# Patient Record
Sex: Male | Born: 1965
Health system: Southern US, Community
[De-identification: ages and names within clinical notes are randomized; demographics above are authoritative.]

## PROBLEM LIST (undated history)

## (undated) DIAGNOSIS — Z8489 Family history of other specified conditions: Secondary | ICD-10-CM

## (undated) DIAGNOSIS — I2721 Secondary pulmonary arterial hypertension: Secondary | ICD-10-CM

## (undated) DIAGNOSIS — Z87442 Personal history of urinary calculi: Secondary | ICD-10-CM

## (undated) DIAGNOSIS — I10 Essential (primary) hypertension: Secondary | ICD-10-CM

## (undated) DIAGNOSIS — D171 Benign lipomatous neoplasm of skin and subcutaneous tissue of trunk: Secondary | ICD-10-CM

## (undated) DIAGNOSIS — G4730 Sleep apnea, unspecified: Secondary | ICD-10-CM

## (undated) HISTORY — DX: Sleep apnea, unspecified: G47.30

## (undated) HISTORY — DX: Essential (primary) hypertension: I10

## (undated) HISTORY — DX: Morbid (severe) obesity due to excess calories: E66.01

## (undated) HISTORY — DX: Secondary pulmonary arterial hypertension: I27.21

---

## 2000-11-13 ENCOUNTER — Ambulatory Visit (HOSPITAL_COMMUNITY): Admission: RE | Admit: 2000-11-13 | Discharge: 2000-11-13 | Payer: Self-pay | Admitting: Specialist

## 2002-08-19 ENCOUNTER — Emergency Department (HOSPITAL_COMMUNITY): Admission: EM | Admit: 2002-08-19 | Discharge: 2002-08-19 | Payer: Self-pay | Admitting: Emergency Medicine

## 2002-08-19 ENCOUNTER — Encounter: Payer: Self-pay | Admitting: Emergency Medicine

## 2002-12-04 ENCOUNTER — Emergency Department (HOSPITAL_COMMUNITY): Admission: EM | Admit: 2002-12-04 | Discharge: 2002-12-04 | Payer: Self-pay | Admitting: Podiatry

## 2005-12-24 ENCOUNTER — Emergency Department (HOSPITAL_COMMUNITY): Admission: EM | Admit: 2005-12-24 | Discharge: 2005-12-24 | Payer: Self-pay | Admitting: Family Medicine

## 2005-12-26 ENCOUNTER — Emergency Department (HOSPITAL_COMMUNITY): Admission: EM | Admit: 2005-12-26 | Discharge: 2005-12-26 | Payer: Self-pay | Admitting: Family Medicine

## 2009-01-09 ENCOUNTER — Emergency Department (HOSPITAL_COMMUNITY): Admission: EM | Admit: 2009-01-09 | Discharge: 2009-01-09 | Payer: Self-pay | Admitting: Family Medicine

## 2009-06-10 ENCOUNTER — Emergency Department (HOSPITAL_COMMUNITY): Admission: EM | Admit: 2009-06-10 | Discharge: 2009-06-10 | Payer: Self-pay | Admitting: Emergency Medicine

## 2009-06-14 ENCOUNTER — Inpatient Hospital Stay (HOSPITAL_COMMUNITY): Admission: EM | Admit: 2009-06-14 | Discharge: 2009-06-18 | Payer: Self-pay | Admitting: Emergency Medicine

## 2009-06-14 ENCOUNTER — Ambulatory Visit: Payer: Self-pay | Admitting: Infectious Disease

## 2009-06-17 ENCOUNTER — Encounter (INDEPENDENT_AMBULATORY_CARE_PROVIDER_SITE_OTHER): Payer: Self-pay | Admitting: Surgery

## 2009-06-25 ENCOUNTER — Encounter (INDEPENDENT_AMBULATORY_CARE_PROVIDER_SITE_OTHER): Payer: Self-pay | Admitting: Internal Medicine

## 2009-06-25 ENCOUNTER — Ambulatory Visit: Payer: Self-pay | Admitting: Internal Medicine

## 2009-06-25 DIAGNOSIS — L03319 Cellulitis of trunk, unspecified: Secondary | ICD-10-CM

## 2009-06-25 DIAGNOSIS — L02219 Cutaneous abscess of trunk, unspecified: Secondary | ICD-10-CM

## 2009-11-15 ENCOUNTER — Observation Stay (HOSPITAL_COMMUNITY): Admission: EM | Admit: 2009-11-15 | Discharge: 2009-11-15 | Payer: Self-pay | Admitting: Emergency Medicine

## 2009-11-15 ENCOUNTER — Ambulatory Visit: Payer: Self-pay | Admitting: Vascular Surgery

## 2009-11-15 ENCOUNTER — Encounter (INDEPENDENT_AMBULATORY_CARE_PROVIDER_SITE_OTHER): Payer: Self-pay | Admitting: Emergency Medicine

## 2011-01-23 LAB — POCT I-STAT, CHEM 8
BUN: 11 mg/dL (ref 6–23)
Calcium, Ion: 1.14 mmol/L (ref 1.12–1.32)
Chloride: 103 mEq/L (ref 96–112)
Creatinine, Ser: 1 mg/dL (ref 0.4–1.5)
Glucose, Bld: 103 mg/dL — ABNORMAL HIGH (ref 70–99)
HCT: 47 % (ref 39.0–52.0)
Hemoglobin: 16 g/dL (ref 13.0–17.0)
Potassium: 3.6 mEq/L (ref 3.5–5.1)
Sodium: 140 mEq/L (ref 135–145)
TCO2: 31 mmol/L (ref 0–100)

## 2011-01-23 LAB — CBC
HCT: 43.5 % (ref 39.0–52.0)
Hemoglobin: 14.4 g/dL (ref 13.0–17.0)
MCHC: 33.1 g/dL (ref 30.0–36.0)
MCV: 80.6 fL (ref 78.0–100.0)
Platelets: 334 10*3/uL (ref 150–400)
RBC: 5.4 MIL/uL (ref 4.22–5.81)
RDW: 14.8 % (ref 11.5–15.5)
WBC: 9.2 10*3/uL (ref 4.0–10.5)

## 2011-01-23 LAB — DIFFERENTIAL
Basophils Absolute: 0.1 10*3/uL (ref 0.0–0.1)
Basophils Relative: 1 % (ref 0–1)
Eosinophils Absolute: 0 10*3/uL (ref 0.0–0.7)
Eosinophils Relative: 1 % (ref 0–5)
Lymphocytes Relative: 25 % (ref 12–46)
Lymphs Abs: 2.3 10*3/uL (ref 0.7–4.0)
Monocytes Absolute: 1 10*3/uL (ref 0.1–1.0)
Monocytes Relative: 11 % (ref 3–12)
Neutro Abs: 5.7 10*3/uL (ref 1.7–7.7)
Neutrophils Relative %: 62 % (ref 43–77)

## 2011-02-12 LAB — BASIC METABOLIC PANEL
BUN: 7 mg/dL (ref 6–23)
BUN: 8 mg/dL (ref 6–23)
CO2: 28 mEq/L (ref 19–32)
Calcium: 8.5 mg/dL (ref 8.4–10.5)
Calcium: 8.5 mg/dL (ref 8.4–10.5)
Chloride: 99 mEq/L (ref 96–112)
Creatinine, Ser: 1.03 mg/dL (ref 0.4–1.5)
Creatinine, Ser: 1.14 mg/dL (ref 0.4–1.5)
Creatinine, Ser: 1.16 mg/dL (ref 0.4–1.5)
GFR calc Af Amer: >60 mL/min (ref >60)
GFR calc non Af Amer: >60 mL/min (ref >60)
GFR calc non Af Amer: >60 mL/min (ref >60)
Glucose, Bld: 91 mg/dL (ref 70–99)
Potassium: 4 mEq/L (ref 3.5–5.1)
Potassium: 4 mEq/L (ref 3.5–5.1)
Sodium: 135 mEq/L (ref 135–145)

## 2011-02-12 LAB — ANAEROBIC CULTURE

## 2011-02-12 LAB — POCT I-STAT, CHEM 8
Glucose, Bld: 105 mg/dL — ABNORMAL HIGH (ref 70–99)
HCT: 49 % (ref 39.0–52.0)
Hemoglobin: 16.7 g/dL (ref 13.0–17.0)
Potassium: 4.1 mEq/L (ref 3.5–5.1)
Sodium: 138 mEq/L (ref 135–145)

## 2011-02-12 LAB — CULTURE, ROUTINE-ABSCESS

## 2011-02-12 LAB — CBC
HCT: 40.1 % (ref 39.0–52.0)
HCT: 43.9 % (ref 39.0–52.0)
MCHC: 33 g/dL (ref 30.0–36.0)
MCV: 80.9 fL (ref 78.0–100.0)
MCV: 81 fL (ref 78.0–100.0)
Platelets: 354 10*3/uL (ref 150–400)
Platelets: 375 10*3/uL (ref 150–400)
Platelets: 377 10*3/uL (ref 150–400)
RBC: 4.98 MIL/uL (ref 4.22–5.81)
RDW: 14.8 % (ref 11.5–15.5)
WBC: 11.3 10*3/uL — ABNORMAL HIGH (ref 4.0–10.5)
WBC: 12.4 10*3/uL — ABNORMAL HIGH (ref 4.0–10.5)
WBC: 13.4 10*3/uL — ABNORMAL HIGH (ref 4.0–10.5)

## 2011-02-12 LAB — CULTURE, BLOOD (ROUTINE X 2): Culture: NO GROWTH

## 2011-02-12 LAB — URINALYSIS, ROUTINE W REFLEX MICROSCOPIC
Bilirubin Urine: NEGATIVE
Glucose, UA: NEGATIVE mg/dL
Ketones, ur: NEGATIVE mg/dL
Protein, ur: NEGATIVE mg/dL

## 2011-02-12 LAB — DIFFERENTIAL
Basophils Absolute: 0 10*3/uL (ref 0.0–0.1)
Basophils Relative: 0 % (ref 0–1)
Basophils Relative: 0 % (ref 0–1)
Eosinophils Absolute: 0.1 10*3/uL (ref 0.0–0.7)
Eosinophils Absolute: 0.1 10*3/uL (ref 0.0–0.7)
Eosinophils Relative: 1 % (ref 0–5)
Lymphocytes Relative: 14 % (ref 12–46)
Lymphs Abs: 1.5 10*3/uL (ref 0.7–4.0)
Lymphs Abs: 1.6 10*3/uL (ref 0.7–4.0)
Monocytes Relative: 12 % (ref 3–12)
Neutro Abs: 10 10*3/uL — ABNORMAL HIGH (ref 1.7–7.7)
Neutro Abs: 8.7 10*3/uL — ABNORMAL HIGH (ref 1.7–7.7)
Neutrophils Relative %: 72 % (ref 43–77)
Neutrophils Relative %: 75 % (ref 43–77)
Neutrophils Relative %: 77 % (ref 43–77)

## 2011-02-12 LAB — FUNGUS CULTURE W SMEAR: Fungal Smear: NONE SEEN

## 2011-02-12 LAB — GLUCOSE, CAPILLARY: Glucose-Capillary: 118 mg/dL — ABNORMAL HIGH (ref 70–99)

## 2011-02-12 LAB — VANCOMYCIN, TROUGH: Vancomycin Tr: 16.1 ug/mL (ref 10.0–20.0)

## 2011-02-12 LAB — COMPREHENSIVE METABOLIC PANEL
ALT: 14 U/L (ref 0–53)
AST: 15 U/L (ref 0–37)
Calcium: 8.7 mg/dL (ref 8.4–10.5)
Creatinine, Ser: 1.05 mg/dL (ref 0.4–1.5)
GFR calc Af Amer: >60 mL/min (ref >60)
Sodium: 135 mEq/L (ref 135–145)
Total Protein: 7.7 g/dL (ref 6.0–8.3)

## 2011-02-12 LAB — AFB CULTURE WITH SMEAR (NOT AT ARMC)

## 2011-02-12 LAB — C-REACTIVE PROTEIN: CRP: 11 mg/dL — ABNORMAL HIGH (ref <0.6)

## 2011-02-12 LAB — LIPID PANEL
HDL: 33 mg/dL — ABNORMAL LOW (ref >39)
Total CHOL/HDL Ratio: 4.5 RATIO

## 2011-02-12 LAB — HEMOGLOBIN A1C
Hgb A1c MFr Bld: 5.9 % (ref 4.6–6.1)
Mean Plasma Glucose: 123 mg/dL

## 2011-02-12 LAB — PROTIME-INR: Prothrombin Time: 14.2 seconds (ref 11.6–15.2)

## 2011-02-12 LAB — TSH: TSH: 0.864 u[IU]/mL (ref 0.350–4.500)

## 2011-03-22 NOTE — Discharge Summary (Signed)
Tyler Walters, Tyler Walters NO.:  [id number]   MEDICAL RECORD NO.:  [id number]          PATIENT TYPE:  INP   LOCATION:  5158                         FACILITY:  [facility]   PHYSICIAN:  Acey Lav, MD  DATE OF BIRTH:  Oct 01, 1966   DATE OF ADMISSION:  06/14/2009  DATE OF DISCHARGE:  06/18/2009                               DISCHARGE SUMMARY   DISCHARGE DIAGNOSES:  1. Abdominal wall cellulitis with abscess.  2. Obesity.   DISCHARGE MEDICATIONS:  1. Percocet 5/325 take 1-2 tablets every 4-6 hours as needed for pain.  2. Keflex 500 mg 2 tablets every 6 hours (4 times a day) for 2 weeks.   DISPOSITION AND FOLLOWUP:  Tyler Walters is discharged in stable  condition.  He will follow with Dr. Theotis Barrio at [facility] on June 25, 2009, at 8:30 a.m.  At that time, it should be  ensured that he is taking his antibiotics that his pain is controlled  and that his wound is healing well.  He will also follow up with Postop  Wound Clinic at [facility] on June 30, 2009, at 3  o'clock p.m.   PROCEDURES PERFORMED:  1. Incision and drainage of abdominal wall abscess on June 17, 2009,      performed by Dr. Manus Rudd.  2. CT scan of the abdomen with contrast on June 14, 2009, shows      marked subcutaneous edema and overlying skin thickening in the left      anterolateral body wall without discrete abscess.  No intra-      abdominal process.   CONSULTATIONS:  [facility], Wilmon Arms. Tsuei, M.D.   ADMITTING HISTORY AND PHYSICAL:  Tyler Walters is a 45 year old morbidly  obese African American male with no past medical history who presents  complaining of redness on his left upper abdomen for 9 days prior to  admission.  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  On the same day, he was also scraped by a nail in the same area also  while performing  construction work.  Since that time, the area has  become progressively more painful and red and for the 5 days prior to  admission, he has had fevers sometimes as high as 104 degrees.  Also  sweating, chills, and nausea.  He presented to the emergency room 4 days  prior to admission and found to have a fever of 101 with diagnosis of  cellulitis and treated with Bactrim, which he has taken without relief.   PHYSICAL EXAMINATION:  VITAL SIGNS:  Temperature 98.7, blood pressure  123/76, respirations 22, oxygen saturation 98% on room air.  ABDOMEN:  Significant for redness and warmth over the left upper abdomen  in a 7 x 7 cm distribution, which is extremely tender to palpation.  No  blood.  No fluctuance or mass.  There is a linear excoriation in the  center of this area; otherwise, exam is unremarkable.  LABORATORY DATA:  Sodium 134, potassium 4.0, chloride 98, bicarb 28, BUN  8, creatinine 1.14, glucose 123.  White blood cells 11.3, hemoglobin  14.4, hematocrit 43.9, platelets 375.   HOSPITAL COURSE:  Abdominal wall cellulitis with abscess.  Tyler Walters  was initially treated with vancomycin and Zosyn on admission and on the  morning of day #2 of admission, the area of erythema had hardened and a  mass had become palpable underneath, which then drained frankly purulent  material.  At that time, Surgery was consulted for incision and drainage  of abscess, which was performed on June 17, 2009, without  complication.  Cultures of the abscess show MSSA, and Tyler Walters will  continue on Keflex with followup in [facility] and ID after discharge.  He will  also have home health assistance with dressing changes provided through  [facility].   DISCHARGE LABORATORY DATA AND VITAL SIGNS:  On day of discharge,  temperature 98.4, pulse 90, blood pressure 126/80, oxygen saturation 93%  on room air.    DICTATION ENDED AT THIS POINT      Elby Showers, MD  Electronically  Signed      Acey Lav, MD  Electronically Signed    CW/MEDQ  D:  06/20/2009  T:  06/20/2009  Job:  [id number]

## 2011-03-22 NOTE — Op Note (Signed)
NAMENASIIR, MONTS NO.:  [id number]   MEDICAL RECORD NO.:  [id number]           PATIENT TYPE:   LOCATION:                                 FACILITY:   PHYSICIAN:  Wilmon Arms. Corliss Skains, M.D. DATE OF BIRTH:  11/01/1966   DATE OF PROCEDURE:  06/17/2009  DATE OF DISCHARGE:                               OPERATIVE REPORT   PREOPERATIVE DIAGNOSIS:  Abdominal wall abscess.   POSTOPERATIVE DIAGNOSIS:  Abdominal wall abscess.   PROCEDURE PERFORMED:  Incision and drainage of abdominal wall abscess.   SURGEON:  Wilmon Arms. Tsuei, MD   ANESTHESIA:  General.   INDICATIONS:  This is a 45 year old male with a past medical history  significant only for super-morbid obesity who apparently was bitten by a  spider about 12 days ago on his abdominal wall.  Several days ago, it  began becoming more inflamed and more painful.  He was admitted to the  hospital on June 14, 2009, and started on IV antibiotics.  A CT scan  showed no obvious abscess cavity, but he has been draining purulent  fluid from a small pinhole on his left lateral abdominal wall.  We were  asked to see the patient yesterday, and we made decision to proceed with  incision and drainage.   DESCRIPTION OF PROCEDURE:  The patient was brought to the operating  room, placed in the supine position on the operating room table.  After  an adequate level of general anesthesia was obtained, the patient's  abdomen was prepped with Betadine, draped in sterile fashion.  A time-  out was taken to assure the proper patient and proper procedure.  I  inserted a hemostat into the opening and entered the large abscess  cavity.  We made a round the incision around this puncture site.  The  purulent fluid was cultured and sent for microbiology.  We enlarged the  hole slightly and I was able to insert a finger.  There was a large  loculated abscess which seemed to extend down into the subcutaneous fat  but did not track down to the  fascia.  We broke up the loculations with  finger dissection.  I made an elliptical incision measuring about 6 x 3  cm.  This exposed the entire abscess cavity.  We used pulse lavage to  thoroughly irrigate the abscess cavity with sterile saline.  Hemostasis  was obtained with cautery.  Kerlix gauze was then used to pack the  wound.  An ABD dressing was applied.  The patient was extubated and  brought to recovery in stable condition.  All sponge, instrument, needle  counts were correct.      Wilmon Arms. Tsuei, M.D.  Electronically Signed     MKT/MEDQ  D:  06/17/2009  T:  06/17/2009  Job:  [id number]

## 2011-03-22 NOTE — Consult Note (Signed)
NAMEHERCHEL, Tyler Walters NO.:  [id number]   MEDICAL RECORD NO.:  [id number]          PATIENT TYPE:  INP   LOCATION:  5158                         FACILITY:  [facility]   PHYSICIAN:  Wilmon Arms. Corliss Skains, M.D. DATE OF BIRTH:  10-Aug-1966   DATE OF CONSULTATION:  06/16/2009  DATE OF DISCHARGE:                                 CONSULTATION   REQUESTING PHYSICIAN:  Acey Lav, MD   CONSULTING SURGEON:  Wilmon Arms. Tsuei, MD   PRIMARY CARE PHYSICIAN:  None.   REASON FOR CONSULTATION:  Abdominal wall abscess.   HISTORY OF PRESENT ILLNESS:  Tyler Walters is a 45 year old black male who  has no significant past medical history except for severe morbid  obesity.  He states that approximately 11 days ago, he got bit by a  black widow spider while he was under his house.  He then proceeded to  get stuck by nail as well as he says.  He says he never had any pain in  this area until approximately 5-7 days ago and then began to notice some  swelling and redness.  We would like an Urgent Care Clinic where he  received Bactrim with no help.  Because this was continuing to worsen,  he came to the emergency department where he was admitted on August 8  and placed on vancomycin and [place].  Subsequently, he underwent a CT  scan, which showed no apparent abscess cavity; however, the patient  continued to drain large amounts of purulent material and therefore we  were asked to see the patient for possible surgical intervention.   REVIEW OF SYSTEMS:  Please see HPI.  Otherwise the patient denies any  chills, but he does admit to fevers and he does admit to pain in his  abdomen.  Otherwise all other systems are negative.   PAST SURGICAL HISTORY:  Several orthopedic surgeries for broken arm and  ankle.   SOCIAL HISTORY:  The patient is married.  He has two sons.  He does not  smoke and does not drink alcohol or use illicit drugs.  He is a  Corporate investment banker.   ALLERGIES:  NKDA.   MEDICATIONS:  Before he came in, he was on Bactrim DS q.12 h, otherwise  no medications.   PHYSICAL EXAMINATION:  GENERAL:  [name] is a 45 year old black male  who is morbidly obese, but otherwise in no acute distress.  VITAL SIGNS:  Temperature 99.2, blood pressure 128/91, pulse 94,  respirations 18.  HEENT:  Head is normocephalic, atraumatic.  Sclerae noninjected.  Pupils  are equal, round, and reactive to light.  Ears and nose, probably no  obvious masses or lesions.  No rhinorrhea.  Mouth is pink and moist, but  it shows no exudate.  HEART:  Regular rate and rhythm.  Normal S1 and S2.  No murmurs, rubs,  or gallops are noted.  LUNGS:  Clear to auscultation bilaterally with no wheezes, rhonchi, or  rales noted.  Respiratory effort is unlabored.  ABDOMEN:  Morbidly obese with active bowel sounds and otherwise  nondistended.  He  is tender around this 13 x 12 cm erythematous,  indurated area.  At the center of this, there is an opening with  spontaneous and copious amounts of purulent drainage.  Otherwise no  masses, hernias, or scars are noted.  PSYCH:  The patient is alert and oriented x2 with an appropriate affect.   LABORATORY DATA AND DIAGNOSTICS:  Sodium 135, potassium 4.1, glucose  105, BUN 6, creatinine 1.03.  White blood cell count 13,400; hemoglobin  13.3; hematocrit 40.4; platelet count of 377,000 with neutrophil count  of 75%.  Blood cultures showed no growth to date.  Diagnostic CT of the  abdomen shows marked subcutaneous edema and overlying skin thickening in  the left anterolateral body wall without discrete abscess.  No other  intra-abdominal processes are noted.   IMPRESSION:  1. Abdominal wall cellulitis and abscess status post spider bite.  2. Morbidly obese.   PLAN:  At this time it appears that the patient will need surgical  intervention for an incision and drainage of this abscess.  Therefore,  we will plan on taking him to the OR tomorrow.  Otherwise  he will be  made n.p.o. after midnight in preparation for surgical intervention.  At  this time we agree with the continuation of antibiotics including  vancomycin and Fortaz.      Letha Cape, PA      Wilmon Arms. Tsuei, M.D.  Electronically Signed    KEO/MEDQ  D:  06/16/2009  T:  06/17/2009  Job:  [id number]

## 2011-03-25 NOTE — Op Note (Signed)
[facility]  Patient:    Tyler Walters, Tyler Walters                        MRN: [id number] Proc. Date: 11/13/00 Adm. Date:  [id number] Attending:  Rocky Crafts                           Operative Report  SURGEON:  Philips J. Montez Morita, M.D.  ASSISTANT:  Dorie Rank, P.A.-C.  PREOPERATIVE DIAGNOSES:  Ruptured biceps tendon from its distal attachment.  POSTOPERATIVE DIAGNOSES:  Ruptured biceps tendon from its distal attachment.  OPERATION PERFORMED:  Exploration of the biceps.  HISTORY OF PRESENT ILLNESS:  This gentleman was injured about three weeks ago at work and followed by his medical doctor who obtained a MRI which showed rupture of the distal tendon from the biceps. When seen in the office last week, he had a big arm, a 300 pound man. We could not definitely feel the tendon, I could not feel it rolled up but I went by the MRI report and felt it best to go ahead and try to repair it. However, it was found to be intact today.  DESCRIPTION OF PROCEDURE:   After suitable general anesthesia, the left arm was prepped and draped up to the axilla and a sterile tourniquet applied and then exsanguinated and an upper arm tourniquet inflated to 250 mmHg. An incision was made on the lateral border of the distal upper arm crossing in an S shaped manner over the antecubital fossa and then medially. I took this incision up quite proximal in anticipation of retraction; however, the biceps tendon was found to be basically intact ______ fibrosis was released. The tendon was followed down finger wise to its attachment onto the greater tuberosity. The tendon was tugged on, pulled heavily and would not give. It was felt that perhaps there had been some partial tearing at the biceps tuberosity which was what was seen on the MRI but basically was intact. The tourniquet was let down, hemostasis was good. The ______ fibrosis was closed with 2-0 coated Vicryl, some 2-0 in the  subcu and a running 4-0 monocryl and the skin with Steri-Strips. He was placed into a sling and allowed to go home. He is to be seen in the office in five days. DD:  11/13/00 TD:  11/13/00 Job: [id number]

## 2012-03-17 ENCOUNTER — Emergency Department (HOSPITAL_COMMUNITY)
Admission: EM | Admit: 2012-03-17 | Discharge: 2012-03-18 | Disposition: A | Discharge status: Home / Self Care | Payer: Self-pay | Attending: Emergency Medicine | Admitting: Emergency Medicine

## 2012-03-17 ENCOUNTER — Emergency Department (HOSPITAL_COMMUNITY): Payer: Self-pay

## 2012-03-17 ENCOUNTER — Encounter (HOSPITAL_COMMUNITY): Payer: Self-pay | Admitting: *Deleted

## 2012-03-17 DIAGNOSIS — Y9289 Other specified places as the place of occurrence of the external cause: Secondary | ICD-10-CM | POA: Insufficient documentation

## 2012-03-17 DIAGNOSIS — M79609 Pain in unspecified limb: Secondary | ICD-10-CM | POA: Insufficient documentation

## 2012-03-17 DIAGNOSIS — Y990 Civilian activity done for income or pay: Secondary | ICD-10-CM | POA: Insufficient documentation

## 2012-03-17 DIAGNOSIS — S61219A Laceration without foreign body of unspecified finger without damage to nail, initial encounter: Secondary | ICD-10-CM

## 2012-03-17 DIAGNOSIS — W268XXA Contact with other sharp object(s), not elsewhere classified, initial encounter: Secondary | ICD-10-CM | POA: Insufficient documentation

## 2012-03-17 DIAGNOSIS — S61209A Unspecified open wound of unspecified finger without damage to nail, initial encounter: Secondary | ICD-10-CM | POA: Insufficient documentation

## 2012-03-17 NOTE — ED Notes (Signed)
Patient with laceration to right and left middle fingers.  Patient states he was working on a garage door, spring broke and lacerated each finger on lateral side of each.  Laceration on right is open, deep and muscle seen, bleeding controlled.  Laceration on left is open, no bleeding at this time.

## 2012-03-17 NOTE — ED Notes (Signed)
Patient with finger injury today from working on a garage door.  Patient has laceration to his bilateral middle fingers and right index finger has open wounds.

## 2012-03-18 MED ORDER — CEPHALEXIN 500 MG PO CAPS: 500.0000 mg | ORAL_CAPSULE | Freq: Four times a day (QID) | ORAL | Status: AC

## 2012-03-18 NOTE — ED Provider Notes (Signed)
Medical screening examination/treatment/procedure(s) were performed by non-physician practitioner and as supervising physician I was immediately available for consultation/collaboration.   Carleene Cooper III, MD 03/18/12 1332

## 2012-03-18 NOTE — ED Notes (Signed)
EDP at bedside suturing lacerations.  

## 2012-03-18 NOTE — ED Provider Notes (Signed)
History     CSN: [id number]  Arrival date & time 03/17/12  2135   First MD Initiated Contact with Patient 03/17/12 2219      Chief Complaint  Patient presents with  . Finger Injury    (Consider location/radiation/quality/duration/timing/severity/associated sxs/prior treatment) HPI Comments: Patient here after working on replacing a garage door where the spring then broke and parts flew out lacerating his right middle finger and his left middle finger - left finger with 2cm laceration - hemostatic - right with 3cm laceration - hemostatic - tetanus 2 years ago - no fever, chills, reports good sensation distal to the injuries and good capillary refill - full flexion and extension intact.  Patient is a 46 y.o. male presenting with hand injury. The history is provided by the patient. No language interpreter was used.  Hand Injury  The incident occurred 3 to 5 hours ago. The incident occurred at work. The injury mechanism was a direct blow. Pain location: bilateral third fingers. The quality of the pain is described as aching. The pain is at a severity of 2/10. The pain is mild. The pain has been constant since the incident. It is unknown if a foreign body is present. The symptoms are aggravated by movement. He has tried nothing for the symptoms.    History reviewed. No pertinent past medical history.  History reviewed. No pertinent past surgical history.  History reviewed. No pertinent family history.  History  Substance Use Topics  . Smoking status: Never Smoker   . Smokeless tobacco: Not on file  . Alcohol Use: No      Review of Systems  Musculoskeletal: Positive for myalgias.  Skin: Positive for wound.  All other systems reviewed and are negative.    Allergies  Review of patient's allergies indicates no known allergies.  Home Medications  No current outpatient prescriptions on file.  BP 158/87  Temp(Src) 98.4 F (36.9 C) (Oral)  Resp 20  SpO2 98%  Physical Exam    Nursing note and vitals reviewed. Constitutional: He is oriented to person, place, and time. He appears well-developed and well-nourished. No distress.  HENT:  Head: Normocephalic and atraumatic.  Right Ear: External ear normal.  Left Ear: External ear normal.  Nose: Nose normal.  Mouth/Throat: Oropharynx is clear and moist. No oropharyngeal exudate.  Eyes: Conjunctivae are normal. Pupils are equal, round, and reactive to light. No scleral icterus.  Neck: Normal range of motion. Neck supple.  Cardiovascular: Normal rate, regular rhythm and normal heart sounds.  Exam reveals no gallop and no friction rub.   No murmur heard. Pulmonary/Chest: Effort normal and breath sounds normal. No respiratory distress. He has no wheezes.  Abdominal: Soft. Bowel sounds are normal. He exhibits no distension. There is no tenderness.  Musculoskeletal: Normal range of motion. He exhibits no edema.       Left 3rd finger with 2cm laceration to radial side of dorsum of finger, flexion extension and abduction and adduction intact, cap refill normal and sensation intact distal to the injury - right 3rd finger with 3cm hemostatic laceration to radial aspect of the dorsum of the finger, flexion, extension, adduction and abduction intact, cap refill and sensation normal.  Lymphadenopathy:    He has no cervical adenopathy.  Neurological: He is alert and oriented to person, place, and time. No cranial nerve deficit. He exhibits normal muscle tone. Coordination normal.  Skin: Skin is warm and dry. No rash noted. No erythema. No pallor.  Psychiatric: He has a normal  mood and affect. His behavior is normal. Judgment and thought content normal.    ED Course  Procedures (including critical care time)  Labs Reviewed - No data to display Dg Hand Complete Left  03/17/2012  *RADIOLOGY REPORT*  Clinical Data: Laceration.  Pain.  LEFT HAND - COMPLETE 3+ VIEW  Comparison: None.  Findings: Laceration is seen along the radial  aspect of the long finger.  No radiopaque foreign body, fracture or dislocation is identified.  Scattered degenerative change is most notable at the DIP joints of the ring and little fingers.  IMPRESSION: Laceration of the long finger.  No foreign body or acute bony or joint abnormality.  Original Report Authenticated By: Bernadene Bell. D'ALESSIO, M.D.   Dg Hand Complete Right  03/17/2012  *RADIOLOGY REPORT*  Clinical Data: Laceration long finger.  RIGHT HAND - COMPLETE 3+ VIEW  Comparison: None.  Findings: No acute bony or joint abnormality is identified.  No radiopaque foreign body is seen.  Laceration is not clearly visualized.  Scattered degenerative change is most notable at the DIP joints of the index, ring and little fingers.  IMPRESSION: Negative for fracture foreign body.  Original Report Authenticated By: Bernadene Bell. D'ALESSIO, M.D.   LACERATION REPAIR Performed by: Patrecia Pour. Authorized by: Patrecia Pour Consent: Verbal consent obtained. Risks and benefits: risks, benefits and alternatives were discussed Consent given by: patient Patient identity confirmed: provided demographic data Prepped and Draped in normal sterile fashion Wound explored  Laceration Location: left 3rd finger  Laceration Length: 2cm  No Foreign Bodies seen or palpated  Anesthesia: local infiltration  Local anesthetic: lidocaine 2% without epinephrine  Anesthetic total: 3 ml  Irrigation method: syringe Amount of cleaning: extensive  Skin closure: nylon 4.0  Number of sutures: 3  Technique: simple interrupted  Patient tolerance: Patient tolerated the procedure well with no immediate complications.  LACERATION REPAIR Performed by: Patrecia Pour. Authorized by: Patrecia Pour Consent: Verbal consent obtained. Risks and benefits: risks, benefits and alternatives were discussed Consent given by: patient Patient identity confirmed: provided demographic data Prepped and Draped in  normal sterile fashion Wound explored  Laceration Location: right 3rd finger  Laceration Length: 3cm  No Foreign Bodies seen or palpated  Anesthesia: local infiltration  Local anesthetic: lidocaine 2% without epinephrine  Anesthetic total: 4 ml  Irrigation method: syringe Amount of cleaning:extensive  Skin closure: nylon 4.0  Number of sutures: 6  Technique: simple interrupted  Patient tolerance: Patient tolerated the procedure well with no immediate complications.  Bilateral finger lacerations  MDM  Patient with bilateral finger lacerations - no evidence of fracture, tendon or nerve damage - sutured and patient tolerated well, no evidence of foreign body.        Izola Price [place], [place] 03/18/12 0031

## 2012-03-18 NOTE — Discharge Instructions (Signed)
Laceration Care, Adult A laceration is a cut that goes through all layers of the skin. The cut goes into the tissue beneath the skin. HOME CARE For stitches (sutures) or staples:  Keep the cut clean and dry.   If you have a bandage (dressing), change it at least once a day. Change the bandage if it gets wet or dirty, or as told by your doctor.   Wash the cut with soap and water 2 times a day. Rinse the cut with water. Pat it dry with a clean towel.   Put a thin layer of medicated cream on the cut as told by your doctor.   You may shower after the first 24 hours. Do not soak the cut in water until the stitches are removed.   Only take medicines as told by your doctor.   Have your stitches or staples removed as told by your doctor.  For skin adhesive strips:  Keep the cut clean and dry.   Do not get the strips wet. You may take a bath, but be careful to keep the cut dry.   If the cut gets wet, pat it dry with a clean towel.   The strips will fall off on their own. Do not remove the strips that are still stuck to the cut.  For wound glue:  You may shower or take baths. Do not soak or scrub the cut. Do not swim. Avoid heavy sweating until the glue falls off on its own. After a shower or bath, pat the cut dry with a clean towel.   Do not put medicine on your cut until the glue falls off.   If you have a bandage, do not put tape over the glue.   Avoid lots of sunlight or tanning lamps until the glue falls off. Put sunscreen on the cut for the first year to reduce your scar.   The glue will fall off on its own. Do not pick at the glue.  You may need a tetanus shot if:  You cannot remember when you had your last tetanus shot.   You have never had a tetanus shot.  If you need a tetanus shot and you choose not to have one, you may get tetanus. Sickness from tetanus can be serious. GET HELP RIGHT AWAY IF:   Your pain does not get better with medicine.   Your arm, hand, leg, or  foot loses feeling (numbness) or changes color.   Your cut is bleeding.   Your joint feels weak, or you cannot use your joint.   You have painful lumps on your body.   Your cut is red, puffy (swollen), or painful.   You have a red line on the skin near the cut.   You have yellowish-white fluid (pus) coming from the cut.   You have a fever.   You have a bad smell coming from the cut or bandage.   Your cut breaks open before or after stitches are removed.   You notice something coming out of the cut, such as wood or glass.   You cannot move a finger or toe.  MAKE SURE YOU:   Understand these instructions.   Will watch your condition.   Will get help right away if you are not doing well or get worse.  Document Released: 04/11/2008 Document Revised: 10/13/2011 Document Reviewed: 04/19/2011 [employer] Patient Information 2012 [employer].Laceration Care, Adult A laceration is a cut or lesion that goes through all layers   of the skin and into the tissue just beneath the skin. TREATMENT  Some lacerations may not require closure. Some lacerations may not be able to be closed due to an increased risk of infection. It is important to see your caregiver as soon as possible after an injury to minimize the risk of infection and maximize the opportunity for successful closure. If closure is appropriate, pain medicines may be given, if needed. The wound will be cleaned to help prevent infection. Your caregiver will use stitches (sutures), staples, wound glue (adhesive), or skin adhesive strips to repair the laceration. These tools bring the skin edges together to allow for faster healing and a better cosmetic outcome. However, all wounds will heal with a scar. Once the wound has healed, scarring can be minimized by covering the wound with sunscreen during the day for 1 full year. HOME CARE INSTRUCTIONS  For sutures or staples:  Keep the wound clean and dry.   If you were given a bandage  (dressing), you should change it at least once a day. Also, change the dressing if it becomes wet or dirty, or as directed by your caregiver.   Wash the wound with soap and water 2 times a day. Rinse the wound off with water to remove all soap. Pat the wound dry with a clean towel.   After cleaning, apply a thin layer of the antibiotic ointment as recommended by your caregiver. This will help prevent infection and keep the dressing from sticking.   You may shower as usual after the first 24 hours. Do not soak the wound in water until the sutures are removed.   Only take over-the-counter or prescription medicines for pain, discomfort, or fever as directed by your caregiver.   Get your sutures or staples removed as directed by your caregiver.  For skin adhesive strips:  Keep the wound clean and dry.   Do not get the skin adhesive strips wet. You may bathe carefully, using caution to keep the wound dry.   If the wound gets wet, pat it dry with a clean towel.   Skin adhesive strips will fall off on their own. You may trim the strips as the wound heals. Do not remove skin adhesive strips that are still stuck to the wound. They will fall off in time.  For wound adhesive:  You may briefly wet your wound in the shower or bath. Do not soak or scrub the wound. Do not swim. Avoid periods of heavy perspiration until the skin adhesive has fallen off on its own. After showering or bathing, gently pat the wound dry with a clean towel.   Do not apply liquid medicine, cream medicine, or ointment medicine to your wound while the skin adhesive is in place. This may loosen the film before your wound is healed.   If a dressing is placed over the wound, be careful not to apply tape directly over the skin adhesive. This may cause the adhesive to be pulled off before the wound is healed.   Avoid prolonged exposure to sunlight or tanning lamps while the skin adhesive is in place. Exposure to ultraviolet light in  the first year will darken the scar.   The skin adhesive will usually remain in place for 5 to 10 days, then naturally fall off the skin. Do not pick at the adhesive film.  You may need a tetanus shot if:  You cannot remember when you had your last tetanus shot.   You have never   had a tetanus shot.  If you get a tetanus shot, your arm may swell, get red, and feel warm to the touch. This is common and not a problem. If you need a tetanus shot and you choose not to have one, there is a rare chance of getting tetanus. Sickness from tetanus can be serious. SEEK MEDICAL CARE IF:   You have redness, swelling, or increasing pain in the wound.   You see a red line that goes away from the wound.   You have yellowish-white fluid (pus) coming from the wound.   You have a fever.   You notice a bad smell coming from the wound or dressing.   Your wound breaks open before or after sutures have been removed.   You notice something coming out of the wound such as wood or glass.   Your wound is on your hand or foot and you cannot move a finger or toe.  SEEK IMMEDIATE MEDICAL CARE IF:   Your pain is not controlled with prescribed medicine.   You have severe swelling around the wound causing pain and numbness or a change in color in your arm, hand, leg, or foot.   Your wound splits open and starts bleeding.   You have worsening numbness, weakness, or loss of function of any joint around or beyond the wound.   You develop painful lumps near the wound or on the skin anywhere on your body.  MAKE SURE YOU:   Understand these instructions.   Will watch your condition.   Will get help right away if you are not doing well or get worse.  Document Released: 10/24/2005 Document Revised: 10/13/2011 Document Reviewed: 04/19/2011 [employer] Patient Information 2012 [employer]. 

## 2012-03-18 NOTE — ED Notes (Signed)
Lacerations to bilateral 3rd digits with sutures intact and abx ointment per EDP. Kerlix drsg to bil 3rd digits.

## 2016-03-02 ENCOUNTER — Encounter (HOSPITAL_COMMUNITY): Payer: Self-pay | Admitting: Emergency Medicine

## 2016-03-02 ENCOUNTER — Emergency Department (HOSPITAL_COMMUNITY): Payer: Self-pay

## 2016-03-02 ENCOUNTER — Emergency Department (HOSPITAL_COMMUNITY)
Admission: EM | Admit: 2016-03-02 | Discharge: 2016-03-02 | Disposition: A | Discharge status: Home / Self Care | Payer: Self-pay | Attending: Emergency Medicine | Admitting: Emergency Medicine

## 2016-03-02 DIAGNOSIS — Y9389 Activity, other specified: Secondary | ICD-10-CM | POA: Insufficient documentation

## 2016-03-02 DIAGNOSIS — Y9289 Other specified places as the place of occurrence of the external cause: Secondary | ICD-10-CM | POA: Insufficient documentation

## 2016-03-02 DIAGNOSIS — M25561 Pain in right knee: Secondary | ICD-10-CM

## 2016-03-02 DIAGNOSIS — S8991XA Unspecified injury of right lower leg, initial encounter: Secondary | ICD-10-CM | POA: Insufficient documentation

## 2016-03-02 DIAGNOSIS — W108XXA Fall (on) (from) other stairs and steps, initial encounter: Secondary | ICD-10-CM | POA: Insufficient documentation

## 2016-03-02 DIAGNOSIS — Y998 Other external cause status: Secondary | ICD-10-CM | POA: Insufficient documentation

## 2016-03-02 NOTE — Discharge Instructions (Signed)
1. Medications: tylenol or motrin for pain, usual home medications 2. Treatment: rest, ice, elevate, drink plenty of fluids, wear knee immobilizer 3. Follow Up: please followup with orthopedics for discussion of your diagnoses and further evaluation after today's visit; if you do not have a primary care doctor use the phone number listed in your discharge paperwork to find one; please return to the ER for increased pain, swelling, numbness, new or worsening symptoms   Knee Pain Knee pain is a common problem. It can have many causes. The pain often goes away by following your doctor's home care instructions. Treatment for ongoing pain will depend on the cause of your pain. If your knee pain continues, more tests may be needed to diagnose your condition. Tests may include X-rays or other imaging studies of your knee. HOME CARE  Take medicines only as told by your doctor.  Rest your knee and keep it raised (elevated) while you are resting.  Do not do things that cause pain or make your pain worse.  Avoid activities where both feet leave the ground at the same time, such as running, jumping rope, or doing jumping jacks.  Apply ice to the knee area:  Put ice in a plastic bag.  Place a towel between your skin and the bag.  Leave the ice on for 20 minutes, 2-3 times a day.  Ask your doctor if you should wear an elastic knee support.  Sleep with a pillow under your knee.  Lose weight if you are overweight. Being overweight can make your knee hurt more.  Do not use any tobacco products, including cigarettes, chewing tobacco, or electronic cigarettes. If you need help quitting, ask your doctor. Smoking may slow the healing of any bone and joint problems that you may have. GET HELP IF:  Your knee pain does not stop, it changes, or it gets worse.  You have a fever along with knee pain.  Your knee gives out or locks up.  Your knee becomes more swollen. GET HELP RIGHT AWAY IF:   Your  knee feels hot to the touch.  You have chest pain or trouble breathing.   This information is not intended to replace advice given to you by your health care provider. Make sure you discuss any questions you have with your health care provider.   Document Released: 01/20/2009 Document Revised: 11/14/2014 Document Reviewed: 12/25/2013 Elsevier Interactive Patient Education [employer].

## 2016-03-02 NOTE — ED Provider Notes (Signed)
CSN: [id number]     Arrival date & time 03/02/16  [id number] History  By signing my name below, I, Eustaquio Maize, attest that this documentation has been prepared under the direction and in the presence of Bernerd Limbo, PA-C. Electronically Signed: Eustaquio Maize, ED Scribe. 03/02/2016. 10:09 AM.   Chief Complaint  Patient presents with  . Fall    The history is provided by the patient. No language interpreter was used.     HPI Comments: Tyler Walters is a 50 y.o. male who presents to the Emergency Department complaining of sudden onset, constant, right knee pain s/p ground level fall that occurred yesterday. Pt reports that he slipped and fell on stairs, causing his right leg to become caught in the stair rail. Pt states that he heard a popping noise in his right knee upon falling. No head injury or LOC. The pain is exacerbated with bending of the knee or twisting of his right foot. He has not taken anything for the pain. Denies weakness, numbness, tingling, or any other associated symptoms.     History reviewed. No pertinent past medical history. History reviewed. No pertinent past surgical history. History reviewed. No pertinent family history. Social History  Substance Use Topics  . Smoking status: Never Smoker   . Smokeless tobacco: None  . Alcohol Use: No    Review of Systems  Musculoskeletal: Positive for arthralgias (right knee).  Neurological: Negative for syncope, weakness and numbness.    Allergies  Morphine and related  Home Medications   Prior to Admission medications   Not on File    BP 152/94 mmHg  Pulse 89  Temp(Src) 98 F (36.7 C) (Oral)  Resp 20  SpO2 98% Physical Exam  Constitutional: He is oriented to person, place, and time. He appears well-developed and well-nourished. No distress.  HENT:  Head: Normocephalic and atraumatic.  Right Ear: External ear normal.  Left Ear: External ear normal.  Nose: Nose normal.  Eyes: Conjunctivae and EOM are  normal. Right eye exhibits no discharge. Left eye exhibits no discharge. No scleral icterus.  Neck: Normal range of motion. Neck supple.  Cardiovascular: Normal rate and regular rhythm.   Pulmonary/Chest: Effort normal and breath sounds normal. No respiratory distress.  Musculoskeletal: Normal range of motion. He exhibits tenderness. He exhibits no edema.  TTP to medial and inferior aspect of right knee. Strength and sensation intact. Distal pulses intact. No obvious MCL or LCL laxity, though exam is limited due to pain. No erythema, edema, or heat.   Neurological: He is alert and oriented to person, place, and time. He has normal strength. No sensory deficit.  Skin: Skin is warm and dry. He is not diaphoretic.  Psychiatric: He has a normal mood and affect. His behavior is normal.  Nursing note and vitals reviewed.   ED Course  Procedures (including critical care time)  DIAGNOSTIC STUDIES: Oxygen Saturation is 98% on RA, normal by my interpretation.    COORDINATION OF CARE: 10:02 AM-Discussed treatment plan which includes DG R Knee with pt at bedside and pt agreed to plan.   Labs Review Labs Reviewed - No data to display  Imaging Review Dg Knee Complete 4 Views Right  03/02/2016  CLINICAL DATA:  Pain and swelling in the knee, fall yesterday, initial encounter. EXAM: RIGHT KNEE - COMPLETE 4+ VIEW COMPARISON:  None. FINDINGS: Small joint effusion. No acute osseous abnormality. Mild tricompartment osteophytosis. IMPRESSION: 1. Small joint effusion.  No fracture. 2. Tricompartment osteophytosis. Electronically Signed  By: Lorin Picket M.D.   On: 03/02/2016 10:19   I have personally reviewed and evaluated these images as part of my medical decision-making.   EKG Interpretation None      MDM   Final diagnoses:  Right knee pain    50 year old male presents with right knee pain s/p twisting his knee while falling yesterday. Denies numbness, weakness, paresthesia. Patient is  afebrile. Vital signs stable. TTP to medial and inferior aspect of right knee with decreased ROM due to pain (though patient is able to flex and extend at knee). Patient is NVI. Imaging of right knee remarkable for small joint effusion, no fracture. Will place in knee immobilizer. Patient to follow-up with ortho. Advised to take tylenol or ibuprofen for pain and to rest, ice, elevate. Return precautions discussed. Patient verbalizes his understanding and is in agreement with plan.  BP 152/94 mmHg  Pulse 89  Temp(Src) 98 F (36.7 C) (Oral)  Resp 20  SpO2 98%  I personally performed the services described in this documentation, which was scribed in my presence. The recorded information has been reviewed and is accurate.      Marella Chimes, PA-C 03/02/16 1251  Carmin Muskrat, MD 03/03/16 [phone]

## 2016-03-02 NOTE — ED Notes (Signed)
Pt sts fell down stairs yesterday and knee was caught in stair rail; pt sts now having right knee pain

## 2016-03-03 NOTE — Progress Notes (Signed)
NURSING PROGRESS NOTE  Tyler Walters [id number] Discharge Data: 03/03/2016 12:54 PM Attending Provider: No att. providers found [id number], Provider, MD     Tyler Walters to be D/C'd Home per MD order.  Discussed with the patient the After Visit Summary and all questions fully answered. Picc line was discontinued with no bleeding noted. All belongings returned to patient for patient to take home. Pt taken downstairs via wheelchair by volunteers.  Last Vital Signs:  Blood pressure 147/96, pulse 87, temperature 98 F (36.7 C), temperature source Oral, resp. rate 18, SpO2 99 %.  Discharge Medication List   Medication List    Notice    You have not been prescribed any medications.

## 2016-12-23 ENCOUNTER — Encounter (HOSPITAL_COMMUNITY): Payer: Self-pay | Admitting: Emergency Medicine

## 2016-12-23 ENCOUNTER — Emergency Department (HOSPITAL_COMMUNITY)
Admission: EM | Admit: 2016-12-23 | Discharge: 2016-12-23 | Disposition: A | Discharge status: Home / Self Care | Payer: Self-pay | Attending: Emergency Medicine | Admitting: Emergency Medicine

## 2016-12-23 ENCOUNTER — Emergency Department (HOSPITAL_COMMUNITY): Payer: Self-pay

## 2016-12-23 DIAGNOSIS — N200 Calculus of kidney: Secondary | ICD-10-CM | POA: Insufficient documentation

## 2016-12-23 LAB — CBC WITH DIFFERENTIAL/PLATELET
Basophils Absolute: 0 10*3/uL (ref 0.0–0.1)
Basophils Relative: 0 %
Eosinophils Absolute: 0 10*3/uL (ref 0.0–0.7)
Eosinophils Relative: 0 %
HCT: 41.5 % (ref 39.0–52.0)
Hemoglobin: 13.2 g/dL (ref 13.0–17.0)
LYMPHS PCT: 15 %
Lymphs Abs: 1.1 10*3/uL (ref 0.7–4.0)
MCH: 23.8 pg — AB (ref 26.0–34.0)
MCHC: 31.8 g/dL (ref 30.0–36.0)
MCV: 74.8 fL — AB (ref 78.0–100.0)
MONO ABS: 0.6 10*3/uL (ref 0.1–1.0)
MONOS PCT: 8 %
Neutro Abs: 5.2 10*3/uL (ref 1.7–7.7)
Neutrophils Relative %: 77 %
Platelets: 369 10*3/uL (ref 150–400)
RBC: 5.55 MIL/uL (ref 4.22–5.81)
RDW: 16.2 % — AB (ref 11.5–15.5)
WBC: 6.8 10*3/uL (ref 4.0–10.5)

## 2016-12-23 LAB — COMPREHENSIVE METABOLIC PANEL
ALT: 20 U/L (ref 17–63)
ANION GAP: 11 (ref 5–15)
AST: 23 U/L (ref 15–41)
Albumin: 3.7 g/dL (ref 3.5–5.0)
Alkaline Phosphatase: 80 U/L (ref 38–126)
BUN: 17 mg/dL (ref 6–20)
CO2: 23 mmol/L (ref 22–32)
Calcium: 8.9 mg/dL (ref 8.9–10.3)
Chloride: 104 mmol/L (ref 101–111)
Creatinine, Ser: 1.28 mg/dL — ABNORMAL HIGH (ref 0.61–1.24)
GFR calc Af Amer: >60 mL/min (ref >60)
Glucose, Bld: 113 mg/dL — ABNORMAL HIGH (ref 65–99)
POTASSIUM: 3.8 mmol/L (ref 3.5–5.1)
Sodium: 138 mmol/L (ref 135–145)
TOTAL PROTEIN: 7.9 g/dL (ref 6.5–8.1)
Total Bilirubin: 0.7 mg/dL (ref 0.3–1.2)

## 2016-12-23 LAB — URINALYSIS, ROUTINE W REFLEX MICROSCOPIC
Bilirubin Urine: NEGATIVE
GLUCOSE, UA: NEGATIVE mg/dL
Hgb urine dipstick: NEGATIVE
KETONES UR: NEGATIVE mg/dL
LEUKOCYTES UA: NEGATIVE
NITRITE: NEGATIVE
PROTEIN: NEGATIVE mg/dL
Specific Gravity, Urine: 1.025 (ref 1.005–1.030)
pH: 5 (ref 5.0–8.0)

## 2016-12-23 LAB — LIPASE, BLOOD: LIPASE: 19 U/L (ref 11–51)

## 2016-12-23 MED ORDER — KETOROLAC TROMETHAMINE 30 MG/ML IJ SOLN
15.0000 mg | Freq: Once | INTRAMUSCULAR | Status: AC
  Administered 2016-12-23: 15 mg via INTRAVENOUS
  Filled 2016-12-23: qty 1

## 2016-12-23 MED ORDER — TAMSULOSIN HCL 0.4 MG PO CAPS: 0.4000 mg | ORAL_CAPSULE | Freq: Every day | ORAL | 0 refills | Status: DC

## 2016-12-23 MED ORDER — OXYCODONE HCL 5 MG PO TABS: 5.0000 mg | ORAL_TABLET | ORAL | 0 refills | Status: DC | PRN

## 2016-12-23 MED ORDER — HYDROMORPHONE HCL 2 MG/ML IJ SOLN
1.0000 mg | Freq: Once | INTRAMUSCULAR | Status: AC
  Administered 2016-12-23: 1 mg via INTRAVENOUS
  Filled 2016-12-23: qty 1

## 2016-12-23 MED ORDER — ONDANSETRON HCL 4 MG/2ML IJ SOLN
4.0000 mg | Freq: Once | INTRAMUSCULAR | Status: AC
  Administered 2016-12-23: 4 mg via INTRAVENOUS
  Filled 2016-12-23: qty 2

## 2016-12-23 MED ORDER — SODIUM CHLORIDE 0.9 % IV BOLUS (SEPSIS)
1000.0000 mL | Freq: Once | INTRAVENOUS | Status: AC
  Administered 2016-12-23: 1000 mL via INTRAVENOUS

## 2016-12-23 NOTE — ED Triage Notes (Signed)
Pt sts left side and back pain starting yesterday

## 2016-12-23 NOTE — Discharge Instructions (Signed)
Follow up with your PCP and urology.  Return for fever or uncontrolled pain.   Take 4 over the counter ibuprofen tablets 3 times a day or 2 over-the-counter naproxen tablets twice a day for pain. Also take tylenol 1000mg (2 extra strength) four times a day.   Then take the pain medicine if you feel like you need it. Narcotics do not help with the pain, they only make you care about it less.  You can become addicted to this, people may break into your house to steal it.  It will constipate you.  If you drive under the influence of this medicine you can get a DUI.

## 2016-12-23 NOTE — ED Notes (Signed)
Patient transported to CT 

## 2016-12-23 NOTE — ED Provider Notes (Signed)
[facility] DEPT Provider Note   CSN: [id number] Arrival date & time: 12/23/16  0703     History   Chief Complaint Chief Complaint  Patient presents with  . Flank Pain    HPI Tyler Walters is a 51 y.o. male.  51 yo M with a chief complaint of right flank pain. This woke him up from sleep this morning about 2 AM. Started as a dull ache and then worsened throughout the day. Initially bending over seems to make it better and then that does not seem to work. He describes it as a constant sharp pain. He had a limited amount of urine output this morning. Denies hematuria. Denies history of stones. Denies fevers or chills. Having some nausea but denies vomiting.   The history is provided by the patient.  Flank Pain  This is a new problem. The current episode started 3 to 5 hours ago. The problem occurs constantly. The problem has been gradually worsening. Associated symptoms include abdominal pain. Pertinent negatives include no chest pain, no headaches and no shortness of breath. Nothing aggravates the symptoms. Nothing relieves the symptoms. He has tried nothing for the symptoms. The treatment provided no relief.    History reviewed. No pertinent past medical history.  Patient Active Problem List   Diagnosis Date Noted  . MORBID OBESITY 06/25/2009  . CELLULITIS AND ABSCESS OF TRUNK 06/25/2009    History reviewed. No pertinent surgical history.     Home Medications    Prior to Admission medications   Medication Sig Start Date End Date Taking? Authorizing Provider  naproxen sodium (ANAPROX) 220 MG tablet Take 220 mg by mouth 2 (two) times daily as needed (pain).   Yes Historical Provider, MD  oxyCODONE (ROXICODONE) 5 MG immediate release tablet Take 1 tablet (5 mg total) by mouth every 4 (four) hours as needed for severe pain. 12/23/16   Deno Etienne, DO  tamsulosin (FLOMAX) 0.4 MG CAPS capsule Take 1 capsule (0.4 mg total) by mouth daily after supper. 12/23/16   Deno Etienne, DO     Family History History reviewed. No pertinent family history.  Social History Social History  Substance Use Topics  . Smoking status: Never Smoker  . Smokeless tobacco: Never Used  . Alcohol use No     Allergies   Morphine and related   Review of Systems Review of Systems  Constitutional: Negative for chills and fever.  HENT: Negative for congestion and facial swelling.   Eyes: Negative for discharge and visual disturbance.  Respiratory: Negative for shortness of breath.   Cardiovascular: Negative for chest pain and palpitations.  Gastrointestinal: Positive for abdominal pain. Negative for diarrhea and vomiting.  Genitourinary: Positive for difficulty urinating and flank pain.  Musculoskeletal: Negative for arthralgias and myalgias.  Skin: Negative for color change and rash.  Neurological: Negative for tremors, syncope and headaches.  Psychiatric/Behavioral: Negative for confusion and dysphoric mood.     Physical Exam Updated Vital Signs BP (!) 130/116   Pulse 85   Temp 98.5 F (36.9 C) (Oral)   Resp 18   SpO2 95%   Physical Exam  Constitutional: He is oriented to person, place, and time. He appears well-developed and well-nourished.  Morbidly obese  HENT:  Head: Normocephalic and atraumatic.  Eyes: EOM are normal. Pupils are equal, round, and reactive to light.  Neck: Normal range of motion. Neck supple. No JVD present.  Cardiovascular: Normal rate and regular rhythm.  Exam reveals no gallop and no friction rub.  No murmur heard. Pulmonary/Chest: No respiratory distress. He has no wheezes.  Abdominal: He exhibits no distension and no mass. There is tenderness ( tenderness worse to the right lateral aspect of the abdomen.). There is no rebound and no guarding.  Difficult exam due to body habitus  Musculoskeletal: Normal range of motion.  Neurological: He is alert and oriented to person, place, and time.  Skin: No rash noted. No pallor.  Psychiatric: He  has a normal mood and affect. His behavior is normal.  Nursing note and vitals reviewed.    ED Treatments / Results  Labs (all labs ordered are listed, but only abnormal results are displayed) Labs Reviewed  CBC WITH DIFFERENTIAL/PLATELET - Abnormal; Notable for the following:       Result Value   MCV 74.8 (*)    MCH 23.8 (*)    RDW 16.2 (*)    All other components within normal limits  COMPREHENSIVE METABOLIC PANEL - Abnormal; Notable for the following:    Glucose, Bld 113 (*)    Creatinine, Ser 1.28 (*)    All other components within normal limits  LIPASE, BLOOD  URINALYSIS, ROUTINE W REFLEX MICROSCOPIC    EKG  EKG Interpretation None       Radiology Ct Renal Stone Study  Result Date: 12/23/2016 CLINICAL DATA:  Right flank pain EXAM: CT ABDOMEN AND PELVIS WITHOUT CONTRAST TECHNIQUE: Multidetector CT imaging of the abdomen and pelvis was performed following the standard protocol without oral or intravenous contrast material administration. COMPARISON:  June 14, 2009 FINDINGS: Lower chest: Lung bases are clear. Hepatobiliary: No focal liver lesions are appreciable. Gallbladder wall is not appreciably thickened. There is no biliary duct dilatation. Pancreas: No pancreatic mass or inflammatory focus. Spleen: No splenic lesions are evident. There is a small accessory spleen medial to the spleen inferiorly. Adrenals/Urinary Tract: Adrenals appear normal bilaterally. The right kidney is edematous. There is no renal mass on either side. There is moderate hydronephrosis on the right. There is no hydronephrosis on the left. There is a 2 mm calculus in the upper pole of the right kidney. No left renal calculi. There is a to mm calculus in the distal right ureter, best seen on coronal slice 123456 series [id number] and sagittal slice 74 series 0000000. No other ureteral calculus seen. Urinary bladder is midline with wall thickness within normal limits. Stomach/Bowel: There is no appreciable bowel wall or  mesenteric thickening. There are scattered colonic diverticula. No bowel obstruction. No free air or portal venous air. Vascular/Lymphatic: There is no abdominal aortic aneurysm. No vascular lesion is evident on this noncontrast enhanced study. There is no appreciable adenopathy in the abdomen or pelvis. Reproductive: Prostate and seminal vesicles appear normal in size and contour. No pelvic mass. Other: Appendix appears unremarkable. There is no ascites or abscess in the abdomen or pelvis. Musculoskeletal: There are no blastic or lytic bone lesions. There is degenerative change in the lower thoracic and lumbar spine regions. There is no intramuscular or abdominal wall lesion evident. IMPRESSION: 2 mm calculus near the right ureterovesical junction causing moderate hydronephrosis and renal edema on the right. There is a nonobstructing 2 mm calculus in the upper pole of the right kidney. Scattered colonic diverticula without diverticulitis. No bowel obstruction. No abscess. Appendix region appears normal. Electronically Signed   By: Lowella Grip III M.D.   On: 12/23/2016 09:04    Procedures Procedures (including critical care time)  Medications Ordered in ED Medications  HYDROmorphone (DILAUDID) injection 1 mg (  1 mg Intravenous Given 12/23/16 0825)  ondansetron (ZOFRAN) injection 4 mg (4 mg Intravenous Given 12/23/16 0821)  sodium chloride 0.9 % bolus 1,000 mL (0 mLs Intravenous Stopped 12/23/16 1009)  ketorolac (TORADOL) 30 MG/ML injection 15 mg (15 mg Intravenous Given 12/23/16 1005)     Initial Impression / Assessment and Plan / ED Course  I have reviewed the triage vital signs and the nursing notes.  Pertinent labs & imaging results that were available during my care of the patient were reviewed by me and considered in my medical decision making (see chart for details).     51 yo M With a chief complaint of right flank pain. Suspect kidney stone based on history and physical the patient is  a limited exam due to morbid obesity. Will obtain CT stone study. 51mm kidney stone.  Not infected, symptoms controlled.  D/c home.   :  I have discussed the diagnosis/risks/treatment options with the patient and family and believe the pt to be eligible for discharge home to follow-up with Nephrology. We also discussed returning to the ED immediately if new or worsening sx occur. We discussed the sx which are most concerning (e.g., sudden worsening pain, fever, inability to tolerate by mouth) that necessitate immediate return. Medications administered to the patient during their visit and any new prescriptions provided to the patient are listed below.  Medications given during this visit Medications  HYDROmorphone (DILAUDID) injection 1 mg (1 mg Intravenous Given 12/23/16 0825)  ondansetron (ZOFRAN) injection 4 mg (4 mg Intravenous Given 12/23/16 0821)  sodium chloride 0.9 % bolus 1,000 mL (0 mLs Intravenous Stopped 12/23/16 1009)  ketorolac (TORADOL) 30 MG/ML injection 15 mg (15 mg Intravenous Given 12/23/16 1005)     The patient appears reasonably screen and/or stabilized for discharge and I doubt any other medical condition or other [facility] requiring further screening, evaluation, or treatment in the ED at this time prior to discharge.    Final Clinical Impressions(s) / ED Diagnoses   Final diagnoses:  Nephrolithiasis    New Prescriptions Discharge Medication List as of 12/23/2016 10:56 AM    START taking these medications   Details  oxyCODONE (ROXICODONE) 5 MG immediate release tablet Take 1 tablet (5 mg total) by mouth every 4 (four) hours as needed for severe pain., Starting Fri 12/23/2016, Print    tamsulosin (FLOMAX) 0.4 MG CAPS capsule Take 1 capsule (0.4 mg total) by mouth daily after supper., Starting Fri 12/23/2016, Numa, DO 12/23/16 1615

## 2016-12-23 NOTE — Discharge Planning (Signed)
Pt up for discharge. EDCM reviewed chart for possible CM needs.  No needs identified or communicated.  

## 2016-12-23 NOTE — ED Notes (Signed)
ED Provider at bedside. 

## 2016-12-27 ENCOUNTER — Encounter (HOSPITAL_COMMUNITY): Payer: Self-pay

## 2016-12-27 ENCOUNTER — Emergency Department (HOSPITAL_COMMUNITY)
Admission: EM | Admit: 2016-12-27 | Discharge: 2016-12-27 | Disposition: A | Discharge status: Home / Self Care | Payer: Self-pay | Attending: Emergency Medicine | Admitting: Emergency Medicine

## 2016-12-27 DIAGNOSIS — N23 Unspecified renal colic: Secondary | ICD-10-CM | POA: Insufficient documentation

## 2016-12-27 LAB — URINALYSIS, ROUTINE W REFLEX MICROSCOPIC
Bilirubin Urine: NEGATIVE
Glucose, UA: NEGATIVE mg/dL
Ketones, ur: NEGATIVE mg/dL
Nitrite: NEGATIVE
Protein, ur: NEGATIVE mg/dL
Specific Gravity, Urine: 1.01 (ref 1.005–1.030)
Squamous Epithelial / LPF: NONE SEEN
pH: 6 (ref 5.0–8.0)

## 2016-12-27 MED ORDER — DOCUSATE SODIUM 100 MG PO CAPS: 100.0000 mg | ORAL_CAPSULE | Freq: Two times a day (BID) | ORAL | 0 refills | Status: DC

## 2016-12-27 MED ORDER — KETOROLAC TROMETHAMINE 60 MG/2ML IM SOLN
60.0000 mg | Freq: Once | INTRAMUSCULAR | Status: AC
  Administered 2016-12-27: 60 mg via INTRAMUSCULAR
  Filled 2016-12-27: qty 2

## 2016-12-27 NOTE — ED Triage Notes (Signed)
Patient here with ongoing flank pain since being diagnosed with kidney stone on Friday. Also constipated after taking percocet, NAD

## 2016-12-27 NOTE — Discharge Instructions (Addendum)
Call the urologist provided in your discharge paperwork previously for an appointment as soon as possible.  Increase your fluid intake, rest as much as possible

## 2016-12-28 NOTE — ED Provider Notes (Signed)
[facility] DEPT Provider Note   CSN: [id number] Arrival date & time: 12/27/16  [id number]     History   Chief Complaint No chief complaint on file.   HPI Tyler Walters is a 51 y.o. male.  HPI Patient presents to the emergency department with flank pain since being diagnosed with a kidney 7 this past Friday.  The patient states that he was told of the time that the pain and stomach past 2 days.  He states that he is sent slurred that the kidney stone could take several weeks to pass.  Patient states that he does not have any worsening symptoms and no worsening complaints.  Patient states that he probably would not be her feet and been told that the stone could take 2 weeks to pass.  He states she has also had some constipation since starting the pain medicationsThe patient denies chest pain, shortness of breath, headache,blurred vision, neck pain, fever, cough, weakness, numbness, dizziness, anorexia, edema,nausea, vomiting, diarrhea, rash, back pain, dysuria, hematemesis, bloody stool, near syncope, or syncope. History reviewed. No pertinent past medical history.  Patient Active Problem List   Diagnosis Date Noted  . MORBID OBESITY 06/25/2009  . CELLULITIS AND ABSCESS OF TRUNK 06/25/2009    History reviewed. No pertinent surgical history.     Home Medications    Prior to Admission medications   Medication Sig Start Date End Date Taking? Authorizing Provider  ibuprofen (ADVIL,MOTRIN) 200 MG tablet Take 400 mg by mouth every 6 (six) hours as needed for mild pain.   Yes Historical Provider, MD  oxyCODONE (ROXICODONE) 5 MG immediate release tablet Take 1 tablet (5 mg total) by mouth every 4 (four) hours as needed for severe pain. 12/23/16  Yes Deno Etienne, DO  tamsulosin (FLOMAX) 0.4 MG CAPS capsule Take 1 capsule (0.4 mg total) by mouth daily after supper. 12/23/16  Yes Deno Etienne, DO  docusate sodium (COLACE) 100 MG capsule Take 1 capsule (100 mg total) by mouth every 12 (twelve) hours.  12/27/16   Dalia Heading, PA-C    Family History No family history on file.  Social History Social History  Substance Use Topics  . Smoking status: Never Smoker  . Smokeless tobacco: Never Used  . Alcohol use No     Allergies   Morphine and related   Review of Systems Review of Systems All other systems negative except as documented in the HPI. All pertinent positives and negatives as reviewed in the HPI.  Physical Exam Updated Vital Signs BP (!) 136/107   Pulse 82   Temp 98.3 F (36.8 C) (Oral)   Resp 18   SpO2 99%   Physical Exam  Constitutional: He is oriented to person, place, and time. He appears well-developed and well-nourished. No distress.  HENT:  Head: Normocephalic and atraumatic.  Mouth/Throat: Oropharynx is clear and moist.  Eyes: Pupils are equal, round, and reactive to light.  Neck: Normal range of motion. Neck supple.  Cardiovascular: Normal rate, regular rhythm and normal heart sounds.  Exam reveals no gallop and no friction rub.   No murmur heard. Pulmonary/Chest: Effort normal and breath sounds normal. No respiratory distress. He has no wheezes.  Abdominal: Soft. Bowel sounds are normal. He exhibits no distension. There is no tenderness.  Neurological: He is alert and oriented to person, place, and time. He exhibits normal muscle tone. Coordination normal.  Skin: Skin is warm and dry. No rash noted. No erythema.  Psychiatric: He has a normal mood and affect.  His behavior is normal.  Nursing note and vitals reviewed.    ED Treatments / Results  Labs (all labs ordered are listed, but only abnormal results are displayed) Labs Reviewed  URINALYSIS, ROUTINE W REFLEX MICROSCOPIC - Abnormal; Notable for the following:       Result Value   Hgb urine dipstick LARGE (*)    Leukocytes, UA MODERATE (*)    Bacteria, UA RARE (*)    All other components within normal limits    EKG  EKG Interpretation None       Radiology No results  found.  Procedures Procedures (including critical care time)  Medications Ordered in ED Medications  ketorolac (TORADOL) injection 60 mg (60 mg Intramuscular Given 12/27/16 1158)     Initial Impression / Assessment and Plan / ED Course  I have reviewed the triage vital signs and the nursing notes.  Pertinent labs & imaging results that were available during my care of the patient were reviewed by me and considered in my medical decision making (see chart for details).     Patient will continue to be treated for the discomfort.  I advised him that he should call the urologist as soon as possible that he was given on his previous visit.  The patient agrees the plan and all questions were answered.  There is no complicating factors or worsening in his condition  Final Clinical Impressions(s) / ED Diagnoses   Final diagnoses:  Ureteral colic    New Prescriptions Discharge Medication List as of 12/27/2016  1:00 PM    START taking these medications   Details  docusate sodium (COLACE) 100 MG capsule Take 1 capsule (100 mg total) by mouth every 12 (twelve) hours., Starting Tue 12/27/2016, Print         [employer], PA-C 12/28/16 1619    Virgel Manifold, MD 01/09/17 1537

## 2018-01-16 ENCOUNTER — Encounter (HOSPITAL_COMMUNITY): Payer: Self-pay

## 2018-01-16 ENCOUNTER — Inpatient Hospital Stay (HOSPITAL_COMMUNITY): Payer: Medicaid Other | Admitting: Anesthesiology

## 2018-01-16 ENCOUNTER — Inpatient Hospital Stay (HOSPITAL_COMMUNITY)
Admission: EM | Admit: 2018-01-16 | Discharge: 2018-01-26 | DRG: 464 | Disposition: A | Discharge status: Rehabilitation Facility | Payer: Medicaid Other | Attending: Student | Admitting: Student

## 2018-01-16 ENCOUNTER — Other Ambulatory Visit: Payer: Self-pay

## 2018-01-16 ENCOUNTER — Emergency Department (HOSPITAL_COMMUNITY): Payer: Medicaid Other

## 2018-01-16 ENCOUNTER — Encounter (HOSPITAL_COMMUNITY)
Admission: EM | Disposition: A | Discharge status: Rehabilitation Facility | Payer: Self-pay | Source: Home / Self Care | Attending: Orthopedic Surgery

## 2018-01-16 ENCOUNTER — Inpatient Hospital Stay (HOSPITAL_COMMUNITY): Payer: Medicaid Other

## 2018-01-16 DIAGNOSIS — W19XXXA Unspecified fall, initial encounter: Secondary | ICD-10-CM

## 2018-01-16 DIAGNOSIS — K5900 Constipation, unspecified: Secondary | ICD-10-CM | POA: Diagnosis not present

## 2018-01-16 DIAGNOSIS — G629 Polyneuropathy, unspecified: Secondary | ICD-10-CM | POA: Diagnosis present

## 2018-01-16 DIAGNOSIS — R000 Tachycardia, unspecified: Secondary | ICD-10-CM | POA: Diagnosis not present

## 2018-01-16 DIAGNOSIS — Y738 Miscellaneous gastroenterology and urology devices associated with adverse incidents, not elsewhere classified: Secondary | ICD-10-CM | POA: Diagnosis not present

## 2018-01-16 DIAGNOSIS — S82152A Displaced fracture of left tibial tuberosity, initial encounter for closed fracture: Secondary | ICD-10-CM | POA: Diagnosis present

## 2018-01-16 DIAGNOSIS — Z833 Family history of diabetes mellitus: Secondary | ICD-10-CM | POA: Diagnosis not present

## 2018-01-16 DIAGNOSIS — W11XXXA Fall on and from ladder, initial encounter: Secondary | ICD-10-CM | POA: Diagnosis present

## 2018-01-16 DIAGNOSIS — D62 Acute posthemorrhagic anemia: Secondary | ICD-10-CM | POA: Diagnosis present

## 2018-01-16 DIAGNOSIS — S82142A Displaced bicondylar fracture of left tibia, initial encounter for closed fracture: Principal | ICD-10-CM | POA: Diagnosis present

## 2018-01-16 DIAGNOSIS — S82452A Displaced comminuted fracture of shaft of left fibula, initial encounter for closed fracture: Secondary | ICD-10-CM | POA: Diagnosis present

## 2018-01-16 DIAGNOSIS — S82402A Unspecified fracture of shaft of left fibula, initial encounter for closed fracture: Secondary | ICD-10-CM

## 2018-01-16 DIAGNOSIS — E65 Localized adiposity: Secondary | ICD-10-CM | POA: Diagnosis present

## 2018-01-16 DIAGNOSIS — R52 Pain, unspecified: Secondary | ICD-10-CM | POA: Diagnosis present

## 2018-01-16 DIAGNOSIS — D72829 Elevated white blood cell count, unspecified: Secondary | ICD-10-CM | POA: Diagnosis present

## 2018-01-16 DIAGNOSIS — T83091A Other mechanical complication of indwelling urethral catheter, initial encounter: Secondary | ICD-10-CM | POA: Diagnosis not present

## 2018-01-16 DIAGNOSIS — Z885 Allergy status to narcotic agent status: Secondary | ICD-10-CM | POA: Diagnosis not present

## 2018-01-16 DIAGNOSIS — Z6841 Body Mass Index (BMI) 40.0 and over, adult: Secondary | ICD-10-CM | POA: Diagnosis not present

## 2018-01-16 DIAGNOSIS — D171 Benign lipomatous neoplasm of skin and subcutaneous tissue of trunk: Secondary | ICD-10-CM | POA: Diagnosis present

## 2018-01-16 DIAGNOSIS — S83262A Peripheral tear of lateral meniscus, current injury, left knee, initial encounter: Secondary | ICD-10-CM | POA: Diagnosis present

## 2018-01-16 DIAGNOSIS — S82202A Unspecified fracture of shaft of left tibia, initial encounter for closed fracture: Secondary | ICD-10-CM | POA: Diagnosis present

## 2018-01-16 DIAGNOSIS — S82142D Displaced bicondylar fracture of left tibia, subsequent encounter for closed fracture with routine healing: Secondary | ICD-10-CM | POA: Diagnosis not present

## 2018-01-16 DIAGNOSIS — T148XXA Other injury of unspecified body region, initial encounter: Secondary | ICD-10-CM

## 2018-01-16 DIAGNOSIS — Z419 Encounter for procedure for purposes other than remedying health state, unspecified: Secondary | ICD-10-CM

## 2018-01-16 HISTORY — DX: Benign lipomatous neoplasm of skin and subcutaneous tissue of trunk: D17.1

## 2018-01-16 HISTORY — PX: EXTERNAL FIXATION LEG: SHX1549

## 2018-01-16 LAB — BASIC METABOLIC PANEL
Anion gap: 11 (ref 5–15)
BUN: 17 mg/dL (ref 6–20)
CHLORIDE: 105 mmol/L (ref 101–111)
CO2: 25 mmol/L (ref 22–32)
Calcium: 8.7 mg/dL — ABNORMAL LOW (ref 8.9–10.3)
Creatinine, Ser: 0.98 mg/dL (ref 0.61–1.24)
GFR calc non Af Amer: >60 mL/min (ref >60)
Glucose, Bld: 110 mg/dL — ABNORMAL HIGH (ref 65–99)
POTASSIUM: 3.9 mmol/L (ref 3.5–5.1)
SODIUM: 141 mmol/L (ref 135–145)

## 2018-01-16 LAB — CBC WITH DIFFERENTIAL/PLATELET
BASOS ABS: 0 10*3/uL (ref 0.0–0.1)
BASOS PCT: 0 %
EOS ABS: 0 10*3/uL (ref 0.0–0.7)
EOS PCT: 0 %
HCT: 41.8 % (ref 39.0–52.0)
HEMOGLOBIN: 13 g/dL (ref 13.0–17.0)
LYMPHS ABS: 1.4 10*3/uL (ref 0.7–4.0)
Lymphocytes Relative: 15 %
MCH: 25 pg — AB (ref 26.0–34.0)
MCHC: 31.1 g/dL (ref 30.0–36.0)
MCV: 80.2 fL (ref 78.0–100.0)
Monocytes Absolute: 0.9 10*3/uL (ref 0.1–1.0)
Monocytes Relative: 9 %
NEUTROS PCT: 76 %
Neutro Abs: 7.2 10*3/uL (ref 1.7–7.7)
Platelets: 377 10*3/uL (ref 150–400)
RBC: 5.21 MIL/uL (ref 4.22–5.81)
RDW: 16.8 % — ABNORMAL HIGH (ref 11.5–15.5)
WBC: 9.4 10*3/uL (ref 4.0–10.5)

## 2018-01-16 SURGERY — EXTERNAL FIXATION, LOWER EXTREMITY
Anesthesia: General | Laterality: Left

## 2018-01-16 SURGERY — EXTERNAL FIXATION, LOWER EXTREMITY
Anesthesia: Choice | Laterality: Left

## 2018-01-16 MED ORDER — ISOPROPYL ALCOHOL 70 % SOLN
Status: AC
  Filled 2018-01-16: qty 480

## 2018-01-16 MED ORDER — DEXAMETHASONE SODIUM PHOSPHATE 10 MG/ML IJ SOLN
INTRAMUSCULAR | Status: AC
  Filled 2018-01-16: qty 1

## 2018-01-16 MED ORDER — CEFAZOLIN SODIUM-DEXTROSE 2-4 GM/100ML-% IV SOLN
2.0000 g | Freq: Four times a day (QID) | INTRAVENOUS | Status: AC
  Administered 2018-01-17 (×3): 2 g via INTRAVENOUS
  Filled 2018-01-16 (×3): qty 100

## 2018-01-16 MED ORDER — METHOCARBAMOL 500 MG PO TABS: 500.0000 mg | ORAL_TABLET | Freq: Four times a day (QID) | ORAL | Status: DC | PRN

## 2018-01-16 MED ORDER — HYDROMORPHONE HCL 1 MG/ML IJ SOLN: 1.0000 mg | INTRAMUSCULAR | Status: DC | PRN

## 2018-01-16 MED ORDER — SODIUM CHLORIDE 0.9 % IV SOLN
INTRAVENOUS | Status: DC | PRN
  Administered 2018-01-16 (×2): via INTRAVENOUS

## 2018-01-16 MED ORDER — MEPERIDINE HCL 50 MG/ML IJ SOLN: 6.2500 mg | INTRAMUSCULAR | Status: DC | PRN

## 2018-01-16 MED ORDER — DEXAMETHASONE SODIUM PHOSPHATE 10 MG/ML IJ SOLN
INTRAMUSCULAR | Status: DC | PRN
  Administered 2018-01-16: 10 mg via INTRAVENOUS

## 2018-01-16 MED ORDER — MIDAZOLAM HCL 2 MG/2ML IJ SOLN
INTRAMUSCULAR | Status: AC
Start: 2018-01-16 — End: ?
  Filled 2018-01-16: qty 2

## 2018-01-16 MED ORDER — LIDOCAINE 2% (20 MG/ML) 5 ML SYRINGE
INTRAMUSCULAR | Status: DC | PRN
  Administered 2018-01-16: 100 mg via INTRAVENOUS

## 2018-01-16 MED ORDER — FENTANYL CITRATE (PF) 250 MCG/5ML IJ SOLN
INTRAMUSCULAR | Status: AC
  Filled 2018-01-16: qty 5

## 2018-01-16 MED ORDER — ACETAMINOPHEN 325 MG PO TABS: 325.0000 mg | ORAL_TABLET | ORAL | Status: DC | PRN

## 2018-01-16 MED ORDER — ACETAMINOPHEN 10 MG/ML IV SOLN
1000.0000 mg | Freq: Once | INTRAVENOUS | Status: AC
  Administered 2018-01-16: 1000 mg via INTRAVENOUS

## 2018-01-16 MED ORDER — ONDANSETRON HCL 4 MG/2ML IJ SOLN: 4.0000 mg | Freq: Once | INTRAMUSCULAR | Status: DC | PRN

## 2018-01-16 MED ORDER — FENTANYL CITRATE (PF) 100 MCG/2ML IJ SOLN
INTRAMUSCULAR | Status: AC
  Filled 2018-01-16: qty 4

## 2018-01-16 MED ORDER — POLYETHYLENE GLYCOL 3350 17 G PO PACK
17.0000 g | PACK | Freq: Every day | ORAL | Status: DC | PRN
  Filled 2018-01-16: qty 1

## 2018-01-16 MED ORDER — METHOCARBAMOL 500 MG PO TABS
500.0000 mg | ORAL_TABLET | Freq: Four times a day (QID) | ORAL | Status: DC | PRN
  Administered 2018-01-17 – 2018-01-23 (×9): 500 mg via ORAL
  Filled 2018-01-16 (×9): qty 1

## 2018-01-16 MED ORDER — PROPOFOL 10 MG/ML IV BOLUS
INTRAVENOUS | Status: DC | PRN
  Administered 2018-01-16: 300 mg via INTRAVENOUS

## 2018-01-16 MED ORDER — ACETAMINOPHEN 325 MG PO TABS: 650.0000 mg | ORAL_TABLET | Freq: Four times a day (QID) | ORAL | Status: DC | PRN

## 2018-01-16 MED ORDER — ACETAMINOPHEN 160 MG/5ML PO SOLN: 325.0000 mg | ORAL | Status: DC | PRN

## 2018-01-16 MED ORDER — SUCCINYLCHOLINE CHLORIDE 200 MG/10ML IV SOSY
PREFILLED_SYRINGE | INTRAVENOUS | Status: AC
  Filled 2018-01-16: qty 10

## 2018-01-16 MED ORDER — PHENYLEPHRINE 40 MCG/ML (10ML) SYRINGE FOR IV PUSH (FOR BLOOD PRESSURE SUPPORT)
PREFILLED_SYRINGE | INTRAVENOUS | Status: AC
  Filled 2018-01-16: qty 10

## 2018-01-16 MED ORDER — METHOCARBAMOL 1000 MG/10ML IJ SOLN
500.0000 mg | Freq: Four times a day (QID) | INTRAVENOUS | Status: DC | PRN
  Administered 2018-01-16 (×2): 500 mg via INTRAVENOUS
  Filled 2018-01-16 (×2): qty 550

## 2018-01-16 MED ORDER — DOCUSATE SODIUM 100 MG PO CAPS: 100.0000 mg | ORAL_CAPSULE | Freq: Two times a day (BID) | ORAL | Status: DC

## 2018-01-16 MED ORDER — ONDANSETRON HCL 4 MG/2ML IJ SOLN
INTRAMUSCULAR | Status: AC
  Filled 2018-01-16: qty 2

## 2018-01-16 MED ORDER — PROPOFOL 10 MG/ML IV BOLUS
INTRAVENOUS | Status: AC
  Filled 2018-01-16: qty 20

## 2018-01-16 MED ORDER — ONDANSETRON HCL 4 MG PO TABS
4.0000 mg | ORAL_TABLET | Freq: Four times a day (QID) | ORAL | Status: DC | PRN
  Administered 2018-01-23: 4 mg via ORAL
  Filled 2018-01-16: qty 1

## 2018-01-16 MED ORDER — MAGNESIUM CITRATE PO SOLN: 1.0000 | Freq: Once | ORAL | Status: DC | PRN

## 2018-01-16 MED ORDER — ENOXAPARIN SODIUM 40 MG/0.4ML ~~LOC~~ SOLN
40.0000 mg | SUBCUTANEOUS | Status: DC
  Administered 2018-01-17 – 2018-01-18 (×2): 40 mg via SUBCUTANEOUS
  Filled 2018-01-16 (×2): qty 0.4

## 2018-01-16 MED ORDER — MIDAZOLAM HCL 5 MG/5ML IJ SOLN
INTRAMUSCULAR | Status: DC | PRN
  Administered 2018-01-16: 2 mg via INTRAVENOUS

## 2018-01-16 MED ORDER — OXYCODONE HCL 5 MG PO TABS: 5.0000 mg | ORAL_TABLET | Freq: Once | ORAL | Status: DC | PRN

## 2018-01-16 MED ORDER — PROPOFOL 10 MG/ML IV BOLUS
INTRAVENOUS | Status: AC
Start: 2018-01-16 — End: ?
  Filled 2018-01-16: qty 20

## 2018-01-16 MED ORDER — OXYCODONE HCL 5 MG/5ML PO SOLN
5.0000 mg | Freq: Once | ORAL | Status: DC | PRN
  Filled 2018-01-16: qty 5

## 2018-01-16 MED ORDER — ROCURONIUM BROMIDE 10 MG/ML (PF) SYRINGE
PREFILLED_SYRINGE | INTRAVENOUS | Status: AC
  Filled 2018-01-16: qty 5

## 2018-01-16 MED ORDER — ONDANSETRON HCL 4 MG/2ML IJ SOLN
4.0000 mg | Freq: Four times a day (QID) | INTRAMUSCULAR | Status: DC | PRN
  Administered 2018-01-17 – 2018-01-25 (×5): 4 mg via INTRAVENOUS
  Filled 2018-01-16 (×4): qty 2

## 2018-01-16 MED ORDER — PHENYLEPHRINE 40 MCG/ML (10ML) SYRINGE FOR IV PUSH (FOR BLOOD PRESSURE SUPPORT)
PREFILLED_SYRINGE | INTRAVENOUS | Status: DC | PRN
  Administered 2018-01-16 (×7): 80 ug via INTRAVENOUS

## 2018-01-16 MED ORDER — HYDROMORPHONE HCL 1 MG/ML IJ SOLN
1.0000 mg | Freq: Once | INTRAMUSCULAR | Status: AC
  Administered 2018-01-16: 1 mg via INTRAVENOUS
  Filled 2018-01-16: qty 1

## 2018-01-16 MED ORDER — DEXMEDETOMIDINE HCL 200 MCG/2ML IV SOLN
INTRAVENOUS | Status: DC | PRN
  Administered 2018-01-16: 20 ug via INTRAVENOUS
  Administered 2018-01-16: 40 ug via INTRAVENOUS
  Administered 2018-01-16: 20 ug via INTRAVENOUS
  Administered 2018-01-16: 40 ug via INTRAVENOUS

## 2018-01-16 MED ORDER — METOCLOPRAMIDE HCL 5 MG/ML IJ SOLN
5.0000 mg | Freq: Three times a day (TID) | INTRAMUSCULAR | Status: DC | PRN
  Administered 2018-01-24: 10 mg via INTRAVENOUS
  Filled 2018-01-16: qty 2

## 2018-01-16 MED ORDER — HYDROCODONE-ACETAMINOPHEN 5-325 MG PO TABS
1.0000 | ORAL_TABLET | ORAL | Status: DC | PRN
  Administered 2018-01-18: 2 via ORAL
  Filled 2018-01-16: qty 2

## 2018-01-16 MED ORDER — SODIUM CHLORIDE 0.9 % IV SOLN
INTRAVENOUS | Status: DC
  Administered 2018-01-17 – 2018-01-24 (×7): via INTRAVENOUS

## 2018-01-16 MED ORDER — FENTANYL CITRATE (PF) 100 MCG/2ML IJ SOLN
25.0000 ug | INTRAMUSCULAR | Status: DC | PRN
  Administered 2018-01-16 (×2): 50 ug via INTRAVENOUS

## 2018-01-16 MED ORDER — ACETAMINOPHEN 500 MG PO TABS
500.0000 mg | ORAL_TABLET | Freq: Four times a day (QID) | ORAL | Status: AC
  Administered 2018-01-16 – 2018-01-17 (×4): 500 mg via ORAL
  Filled 2018-01-16 (×4): qty 1

## 2018-01-16 MED ORDER — SODIUM CHLORIDE 0.9 % IR SOLN
Status: DC | PRN
  Administered 2018-01-16: 1000 mL

## 2018-01-16 MED ORDER — METOCLOPRAMIDE HCL 5 MG PO TABS: 5.0000 mg | ORAL_TABLET | Freq: Three times a day (TID) | ORAL | Status: DC | PRN

## 2018-01-16 MED ORDER — OXYCODONE HCL 5 MG PO TABS
5.0000 mg | ORAL_TABLET | ORAL | Status: DC | PRN
  Administered 2018-01-17 (×2): 10 mg via ORAL
  Administered 2018-01-17: 5 mg via ORAL
  Administered 2018-01-18 – 2018-01-21 (×8): 10 mg via ORAL
  Administered 2018-01-21: 5 mg via ORAL
  Administered 2018-01-21 – 2018-01-22 (×5): 10 mg via ORAL
  Administered 2018-01-22 – 2018-01-23 (×2): 5 mg via ORAL
  Administered 2018-01-23 – 2018-01-26 (×11): 10 mg via ORAL
  Filled 2018-01-16: qty 1
  Filled 2018-01-16 (×7): qty 2
  Filled 2018-01-16: qty 1
  Filled 2018-01-16 (×6): qty 2
  Filled 2018-01-16 (×2): qty 1
  Filled 2018-01-16 (×12): qty 2

## 2018-01-16 MED ORDER — ACETAMINOPHEN 650 MG RE SUPP: 650.0000 mg | Freq: Four times a day (QID) | RECTAL | Status: DC | PRN

## 2018-01-16 MED ORDER — DOCUSATE SODIUM 100 MG PO CAPS
100.0000 mg | ORAL_CAPSULE | Freq: Two times a day (BID) | ORAL | Status: DC
  Administered 2018-01-16 – 2018-01-26 (×11): 100 mg via ORAL
  Filled 2018-01-16 (×14): qty 1

## 2018-01-16 MED ORDER — HYDROCODONE-ACETAMINOPHEN 7.5-325 MG PO TABS
1.0000 | ORAL_TABLET | ORAL | Status: DC | PRN
  Administered 2018-01-17: 1 via ORAL
  Administered 2018-01-18 – 2018-01-19 (×2): 2 via ORAL
  Filled 2018-01-16: qty 1
  Filled 2018-01-16 (×2): qty 2

## 2018-01-16 MED ORDER — DIPHENHYDRAMINE HCL 12.5 MG/5ML PO ELIX: 25.0000 mg | ORAL_SOLUTION | ORAL | Status: DC | PRN

## 2018-01-16 MED ORDER — MORPHINE SULFATE (PF) 2 MG/ML IV SOLN: 0.5000 mg | INTRAVENOUS | Status: DC | PRN

## 2018-01-16 MED ORDER — DEXTROSE 5 % IV SOLN
3.0000 g | Freq: Once | INTRAVENOUS | Status: AC
  Administered 2018-01-16: 3 g via INTRAVENOUS
  Filled 2018-01-16: qty 3

## 2018-01-16 MED ORDER — LIDOCAINE 2% (20 MG/ML) 5 ML SYRINGE
INTRAMUSCULAR | Status: AC
  Filled 2018-01-16: qty 5

## 2018-01-16 MED ORDER — KETOROLAC TROMETHAMINE 30 MG/ML IJ SOLN
30.0000 mg | Freq: Once | INTRAMUSCULAR | Status: DC | PRN
  Administered 2018-01-16: 30 mg via INTRAVENOUS

## 2018-01-16 MED ORDER — ONDANSETRON HCL 4 MG/2ML IJ SOLN
INTRAMUSCULAR | Status: DC | PRN
  Administered 2018-01-16: 4 mg via INTRAVENOUS

## 2018-01-16 MED ORDER — KETOROLAC TROMETHAMINE 30 MG/ML IJ SOLN
INTRAMUSCULAR | Status: AC
  Filled 2018-01-16: qty 1

## 2018-01-16 MED ORDER — FENTANYL CITRATE (PF) 100 MCG/2ML IJ SOLN
INTRAMUSCULAR | Status: DC | PRN
  Administered 2018-01-16 (×2): 50 ug via INTRAVENOUS
  Administered 2018-01-16: 100 ug via INTRAVENOUS

## 2018-01-16 MED ORDER — METHOCARBAMOL 1000 MG/10ML IJ SOLN
500.0000 mg | Freq: Four times a day (QID) | INTRAVENOUS | Status: DC | PRN
  Filled 2018-01-16: qty 5

## 2018-01-16 MED ORDER — ACETAMINOPHEN 325 MG PO TABS
325.0000 mg | ORAL_TABLET | Freq: Four times a day (QID) | ORAL | Status: DC | PRN
  Administered 2018-01-21 – 2018-01-25 (×5): 650 mg via ORAL
  Filled 2018-01-16 (×5): qty 2

## 2018-01-16 MED ORDER — ACETAMINOPHEN 10 MG/ML IV SOLN
INTRAVENOUS | Status: AC
Start: 2018-01-16 — End: 2018-01-16
  Filled 2018-01-16: qty 100

## 2018-01-16 MED ORDER — TAMSULOSIN HCL 0.4 MG PO CAPS
0.4000 mg | ORAL_CAPSULE | Freq: Every day | ORAL | Status: DC
  Administered 2018-01-17 – 2018-01-26 (×10): 0.4 mg via ORAL
  Filled 2018-01-16 (×10): qty 1

## 2018-01-16 MED ORDER — SORBITOL 70 % SOLN
30.0000 mL | Freq: Every day | Status: DC | PRN
  Filled 2018-01-16: qty 30

## 2018-01-16 SURGICAL SUPPLY — 23 items
BANDAGE ACE 6X5 VEL STRL LF (GAUZE/BANDAGES/DRESSINGS) ×2 IMPLANT
BAR EXFX 500X11 NS LF (EXFIX) ×2
BAR GLASS FIBER EXFX 11X500 (EXFIX) ×4 IMPLANT
BIT DRILL CANN MED FLUTE 4.0 (BIT) IMPLANT
BIT DRILL ORANGE 4.0 LONG (BIT) ×2 IMPLANT
BNDG GAUZE ELAST 4 BULKY (GAUZE/BANDAGES/DRESSINGS) ×2 IMPLANT
DRAPE C-ARM 42X120 X-RAY (DRAPES) ×2 IMPLANT
DRAPE C-ARMOR (DRAPES) ×2 IMPLANT
DRAPE ORTHO SPLIT 77X108 STRL (DRAPES) ×6
DRAPE SURG ORHT 6 SPLT 77X108 (DRAPES) IMPLANT
DRAPE U-SHAPE 47X51 STRL (DRAPES) ×4 IMPLANT
DRILL CANN 4.0MM (BIT) ×3
GAUZE XEROFORM 1X8 LF (GAUZE/BANDAGES/DRESSINGS) ×2 IMPLANT
GLOVE BIO SURGEON STRL SZ7 (GLOVE) ×6 IMPLANT
GLOVE ECLIPSE 7.5 STRL STRAW (GLOVE) ×4 IMPLANT
GOWN STRL REUS W/ TWL LRG LVL3 (GOWN DISPOSABLE) IMPLANT
GOWN STRL REUS W/TWL LRG LVL3 (GOWN DISPOSABLE) ×9
KIT BASIN OR (CUSTOM PROCEDURE TRAY) ×2 IMPLANT
PACK TOTAL JOINT (CUSTOM PROCEDURE TRAY) ×2 IMPLANT
PIN CLAMP 2BAR 75MM BLUE (EXFIX) ×4 IMPLANT
PIN HALF ORANGE 5X200X45MM (EXFIX) ×4 IMPLANT
PIN HALF YELLOW 5X160X35 (EXFIX) ×4 IMPLANT
TOWEL NATURAL 10PK STERILE (DISPOSABLE) ×2 IMPLANT

## 2018-01-16 NOTE — Plan of Care (Signed)
Reviewed plan of care, specifically pain control, safety, and importance of notifying RN with any questions or concerns. Pt and spouse attentive to all education and verbalized understanding.

## 2018-01-16 NOTE — ED Notes (Signed)
Bed: WA21 Expected date:  Expected time:  Means of arrival:  Comments: EMS-leg deformity

## 2018-01-16 NOTE — ED Notes (Addendum)
Patient belongings were given to security, pt's wife went to get blood drawn.

## 2018-01-16 NOTE — Anesthesia Preprocedure Evaluation (Signed)
Anesthesia Evaluation  Patient identified by MRN, date of birth, ID band Patient awake    Reviewed: Allergy & Precautions, NPO status , Patient's Chart, lab work & pertinent test results  Airway Mallampati: II  TM Distance: >3 FB Neck ROM: Full    Dental no notable dental hx.    Pulmonary neg pulmonary ROS,    Pulmonary exam normal breath sounds clear to auscultation       Cardiovascular negative cardio ROS Normal cardiovascular exam Rhythm:Regular Rate:Normal     Neuro/Psych negative neurological ROS  negative psych ROS   GI/Hepatic negative GI ROS, Neg liver ROS,   Endo/Other  Morbid obesity  Renal/GU negative Renal ROS  negative genitourinary   Musculoskeletal negative musculoskeletal ROS (+)   Abdominal   Peds negative pediatric ROS (+)  Hematology negative hematology ROS (+)   Anesthesia Other Findings   Reproductive/Obstetrics negative OB ROS                             Anesthesia Physical Anesthesia Plan  ASA: III and emergent  Anesthesia Plan: General   Post-op Pain Management:    Induction: Intravenous  PONV Risk Score and Plan: 1 and Ondansetron and Treatment may vary due to age or medical condition  Airway Management Planned: LMA  Additional Equipment:   Intra-op Plan:   Post-operative Plan: Extubation in OR  Informed Consent:   Plan Discussed with: CRNA, Surgeon and Anesthesiologist  Anesthesia Plan Comments: ( )        Anesthesia Quick Evaluation

## 2018-01-16 NOTE — Anesthesia Procedure Notes (Signed)
Procedure Name: LMA Insertion Date/Time: 01/16/2018 7:20 PM Performed by: Lovina Zuver D, CRNA Pre-anesthesia Checklist: Patient identified, Emergency Drugs available, Suction available and Patient being monitored Patient Re-evaluated:Patient Re-evaluated prior to induction Oxygen Delivery Method: Circle system utilized Preoxygenation: Pre-oxygenation with 100% oxygen Induction Type: IV induction Ventilation: Mask ventilation without difficulty LMA: LMA inserted LMA Size: 5.0 Tube type: Oral Number of attempts: 1 Placement Confirmation: positive ETCO2 and breath sounds checked- equal and bilateral Tube secured with: Tape Dental Injury: Teeth and Oropharynx as per pre-operative assessment

## 2018-01-16 NOTE — ED Triage Notes (Signed)
Pt was fixing a car port, about 92ft on an A-frame ladder, when he fell backwards. His left leg was caught in between the ladder. Pt reports hearing a snap and a zipper sound at the same time. C/o left leg pain, just below the knee, and left ankle pain. A/Ox4.

## 2018-01-16 NOTE — Op Note (Signed)
   Date of Surgery: 01/16/2018  INDICATIONS: [name] is a 52 y.o.-year-old male who sustained an unstable bicondylar tibial plateau fracture; he was indicated for external fixation due to the displaced and unstable nature of the fracture and came to the operating room today for this procedure. The patient did consent to the procedure after discussion of the risks and benefits.   PREOPERATIVE DIAGNOSIS: left bicondylar tibial plateau fracture   POSTOPERATIVE DIAGNOSIS: Same.  PROCEDURE: External fixation left tibial plateau fracture CPT 20690 uniplane  SURGEON: N. Eduard Roux, M.D.  ASSIST: Ciro Backer [place], [place]; necessary for the timely completion of procedure and due to complexity of procedure.  ANESTHESIA: general  IV FLUIDS AND URINE: See anesthesia.  ESTIMATED BLOOD LOSS: minimal mL.  IMPLANTS: Zimmer  DRAINS: None.  COMPLICATIONS: None.  DESCRIPTION OF PROCEDURE: The patient was identified in the preoperative holding area.  The operative site was marked by the surgeon and confirmed by the patient.  He was brought back to the operating room.  Anesthesia was induced by the anesthesia team.  A well padded nonsterile tourniquet was placed. The operative extremity was prepped and draped in standard sterile fashion.  A timeout was performed.  Preoperative antibiotics were given.  The bony landmarks were palpated and the pin sites were marked on the skin.  Each Schanz pin was placed in the same fashion -- first drilling with the 3.5 mm drill while copiously irrigating, then hand placing the pin.  This was confirmed on x-ray on both views.  The ex-fix clamps were placed onto pins and the fracture was pulled into the proper alignment.  The clamps were completely tightened.   Final x-rays were taken in AP and lateral views to confirm the reduction and pin lengths. The wounds were cleaned and dried a final time and a sterile dressing consisting of Xeroform and kerlix was placed.  Of  note, the patient's compartments remained soft throughout the procedure.  The patient was then transferred back to the bed and left the operating room in stable condition.  All sponge and instrument counts were correct.  POSTOPERATIVE PLAN: [name] will remain non weight bearing with the leg elevated.  he will return to the operating room for definitive fixation when the swelling has gone down.  [name] will receive DVT prophylaxis based on other medications, activity level, and risk ratio of bleeding to thrombosis.  Pin site care will be initiated on postoperative day one.  Azucena Cecil, MD [place] 8:39 PM

## 2018-01-16 NOTE — ED Provider Notes (Signed)
[facility] DEPT Provider Note   CSN: [id number] Arrival date & time: 01/16/18  1359     History   Chief Complaint No chief complaint on file.   HPI Tyler Walters is a 52 y.o. male.  HPI   Patient is a 52 year old male with a history of morbid obesity and unclear lipomatous growths, not biopsy confirmed, presenting for left knee trauma.  Patient reports that he was approximately 3 feet up a ladder on a platform today when the ladder folded underneath him and his left leg got caught within the ladder.  Patient denies head trauma or neck trauma, or other injury sustained in this fall.  No open wounds obtained in this fall.  Subsequently, patient reports that he has decreased sensation down his left lower externa, however he reports he has neuropathy at baseline.  Patient was unable to ambulate secondary to this injury, and EMS was called to the scene.  Of note, patient reports that he was told that he had a lipomatous growth in the abdominal wall back in 2014, and has grown in significant size since.  Patient has never had a biopsy.  Patient also notes that he is noticed a growth on his right neck that is similar in character over the last 4-5 months as well as one on his right shoulder.  Patient reports that he cannot afford surgical consult for these growths.  History reviewed. No pertinent past medical history.  Patient Active Problem List   Diagnosis Date Noted  . MORBID OBESITY 06/25/2009  . CELLULITIS AND ABSCESS OF TRUNK 06/25/2009    History reviewed. No pertinent surgical history.     Home Medications    Prior to Admission medications   Medication Sig Start Date End Date Taking? Authorizing Provider  docusate sodium (COLACE) 100 MG capsule Take 1 capsule (100 mg total) by mouth every 12 (twelve) hours. Patient not taking: Reported on 01/16/2018 12/27/16   Dalia Heading, PA-C  oxyCODONE (ROXICODONE) 5 MG immediate release tablet Take 1  tablet (5 mg total) by mouth every 4 (four) hours as needed for severe pain. Patient not taking: Reported on 01/16/2018 12/23/16   Deno Etienne, DO  tamsulosin (FLOMAX) 0.4 MG CAPS capsule Take 1 capsule (0.4 mg total) by mouth daily after supper. Patient not taking: Reported on 01/16/2018 12/23/16   Deno Etienne, DO    Family History History reviewed. No pertinent family history.  Social History Social History   Tobacco Use  . Smoking status: Never Smoker  . Smokeless tobacco: Never Used  Substance Use Topics  . Alcohol use: No  . Drug use: No     Allergies   Morphine and related   Review of Systems Review of Systems  Gastrointestinal: Negative for nausea and vomiting.  Musculoskeletal: Positive for arthralgias and joint swelling.  Skin: Negative for color change and wound.  Neurological: Negative for syncope.  All other systems reviewed and are negative.    Physical Exam Updated Vital Signs BP (!) 151/123 (BP Location: Left Arm)   Pulse 95   Temp 98.1 F (36.7 C) (Oral)   Resp 20   Ht 5\' 8"  (1.727 m)   Wt (!) 186 kg (410 lb)   SpO2 99%   BMI 62.34 kg/m   Physical Exam  Constitutional: He appears well-developed and well-nourished. No distress.  HENT:  Head: Normocephalic and atraumatic.  Mouth/Throat: Oropharynx is clear and moist.  Eyes: Conjunctivae and EOM are normal. Pupils are equal, round, and  reactive to light.  Neck: Normal range of motion. Neck supple.  There is a soft lipomatous in character, mobile mass of the right posterior neck.  Similar in character mass of the left shoulder, but not in the supraclavicular nodal region.  Cardiovascular: Normal rate, regular rhythm, S1 normal and S2 normal.  No murmur heard. Pulmonary/Chest: Effort normal and breath sounds normal. He has no wheezes. He has no rales.  Abdominal: Soft. He exhibits distension. There is no tenderness. There is no guarding.  Obese abdomen with distention particularly on the right side  compared to the left. There is an ulceration within the umbilicus.  No erythema of the abdomen.  Musculoskeletal: He exhibits edema. He exhibits no deformity.  Left hip without edema or tenderness to palpation.  Left femur without tenderness to palpation.  Left knee exhibits a medial effusion without clear deformity, erythema, or opening in the skin.  There is joint line tenderness to palpation of the left knee. Left ankle without tenderness to palpation of the medial malleolus, lateral malleolus, navicular, or fifth meta tarsal. Compartments of left lower extremity are soft. 2+ DP pulse, and sensation intact to light touch distally.   Lymphadenopathy:    He has no cervical adenopathy.  Neurological: He is alert.  Cranial nerves grossly intact. Patient moves extremities symmetrically and with good coordination.  Skin: Skin is warm and dry. No rash noted. No erythema.  Psychiatric: He has a normal mood and affect. His behavior is normal. Judgment and thought content normal.  Nursing note and vitals reviewed.    ED Treatments / Results  Labs (all labs ordered are listed, but only abnormal results are displayed) Labs Reviewed - No data to display  EKG  EKG Interpretation None       Radiology No results found.  Procedures Procedures (including critical care time)  Medications Ordered in ED Medications  HYDROmorphone (DILAUDID) injection 1 mg (1 mg Intravenous Given 01/16/18 1456)     Initial Impression / Assessment and Plan / ED Course  I have reviewed the triage vital signs and the nursing notes.  Pertinent labs & imaging results that were available during my care of the patient were reviewed by me and considered in my medical decision making (see chart for details).     52 year old male with obvious deformity to the left knee.  Suspicion high for tibial plateau fracture.  Given the patient's body habitus, will obtain radiographs of the entire left leg, as assessment is  difficult.  Dilaudid for pain control.  I was called to evaluate the patient in the radiology room by radiology tech.  Patient exhibits a comminuted fracture of both the proximal tibia and fibula.  This is intra-articular in appearance.  They will complete the exam with a complete tib-fib series.  CT of the left knee ordered.  Unclear the source of patient's lipomatous growths.  Patient to require primary care follow-up to assist in the management of these.  Patient has not had any other systemic symptoms such as weight loss, nausea vomiting, diarrhea, constipation, but does report night sweats.  Discussed case with Haze Justin, who states that this will be addressed at [facility] discharge.   1645 I spoke with Dr. Erlinda Hong of orthopedic surgery, who would like the patient transferred to [facility] and admitted.   Final Clinical Impressions(s) / ED Diagnoses   Final diagnoses:  Closed fracture of left tibia and fibula, initial encounter  Fall, initial encounter    ED Discharge Orders  None       Tamala Julian 01/16/18 1815    Carmin Muskrat, MD 01/20/18 [phone]

## 2018-01-16 NOTE — ED Notes (Signed)
Pt still currently in radiology

## 2018-01-16 NOTE — H&P (Signed)
ORTHOPAEDIC HISTORY AND PHYSICAL   Chief Complaint: LLE fracture  HPI: Tyler Walters is a 52 y.o. male who sustained a left tibial plateau fracture s/p fall from step ladder earlier today.  He experienced immediate pain and deformity and inability to weight bear.  Denies any other symptoms.    PMHx is neg for DM Denies past surgical history Social History   Socioeconomic History  . Marital status: Married    Spouse name: None  . Number of children: None  . Years of education: None  . Highest education level: None  Social Needs  . Financial resource strain: None  . Food insecurity - worry: None  . Food insecurity - inability: None  . Transportation needs - medical: None  . Transportation needs - non-medical: None  Occupational History  . None  Tobacco Use  . Smoking status: Never Smoker  . Smokeless tobacco: Never Used  Substance and Sexual Activity  . Alcohol use: No  . Drug use: No  . Sexual activity: None  Other Topics Concern  . None  Social History Narrative  . None   History reviewed. No pertinent family history. Allergies  Allergen Reactions  . Morphine And Related Nausea Only   Prior to Admission medications   Medication Sig Start Date End Date Taking? Authorizing Provider  docusate sodium (COLACE) 100 MG capsule Take 1 capsule (100 mg total) by mouth every 12 (twelve) hours. Patient not taking: Reported on 01/16/2018 12/27/16   Dalia Heading, PA-C  oxyCODONE (ROXICODONE) 5 MG immediate release tablet Take 1 tablet (5 mg total) by mouth every 4 (four) hours as needed for severe pain. Patient not taking: Reported on 01/16/2018 12/23/16   Deno Etienne, DO  tamsulosin (FLOMAX) 0.4 MG CAPS capsule Take 1 capsule (0.4 mg total) by mouth daily after supper. Patient not taking: Reported on 01/16/2018 12/23/16   Deno Etienne, DO   Dg Tibia/fibula Left  Result Date: 01/16/2018 CLINICAL DATA:  Patient's left foot fell through hole, with acute onset of left knee and  lower leg pain. Initial encounter. EXAM: LEFT TIBIA AND FIBULA - 2 VIEW COMPARISON:  None. FINDINGS: There is a comminuted fracture of the proximal tibia, extending from the tibial spine and lateral tibial plateau to the proximal tibial diaphysis. There is also a mildly comminuted fracture of the proximal fibula. Approximately 2 cm of separation is noted at the lateral tibial plateau. Lateral displacement of the patella may reflect underlying medial retinaculum injury. A lipohemarthrosis is noted at the left knee joint. Soft tissue swelling is noted about the left knee and lower leg. The ankle mortise is incompletely assessed, but appears grossly unremarkable. There is a somewhat unusual appearance to the calcaneus and subtalar joint. Though this may reflect degenerative change, underlying fracture cannot be excluded. Dedicated left ankle radiographs would be helpful for further evaluation. IMPRESSION: 1. Comminuted fracture of the proximal left tibia, extending from the tibial spine and lateral tibial plateau to the proximal tibial diaphysis. Mildly comminuted fracture of the proximal fibula. Approximately 2 cm of separation at the lateral tibial plateau. 2. Lateral displacement of the patella may reflect underlying medial retinaculum injury. 3. Lipohemarthrosis at the left knee joint. 4. Somewhat unusual appearance to the calcaneus and subtalar joint. Though this may reflect degenerative change, underlying fracture cannot be excluded. Dedicated left ankle radiographs of the left ankle would be helpful for further evaluation. Electronically Signed   By: Garald Balding M.D.   On: 01/16/2018 16:50   Ct  Knee Left Wo Contrast  Result Date: 01/16/2018 CLINICAL DATA:  Patient fell backwards off a ladder 3 feet. Acute left knee pain. EXAM: CT OF THE LEFT KNEE WITHOUT CONTRAST TECHNIQUE: Multidetector CT imaging of the LEFT knee was performed according to the standard protocol. Multiplanar CT image reconstructions were  also generated. COMPARISON:  Radiographs from 1547 hours FINDINGS: Bones/Joint/Cartilage Moderate lipohemarthrosis in the suprapatellar pouch of the left knee joint. Acute, comminuted fracture of the lateral tibial plateau with 1 cm of lateral distraction of the tibial plateau fracture fragments and 0.9 cm lateral tibial plateau depression posteriorly as well as laterally. Fracture undermines the tibial spine and is extends to the base of the medial tibial spine along its medial base. A comminuted fracture involving the metadiaphysis of the proximal tibia is noted with 16 degrees of apex posterior angulation noted. Intact patella.  No joint dislocation. Ligaments Suboptimally assessed by CT. Muscles and Tendons No muscle atrophy nor hemorrhage. Intact appearing extensor mechanism tendons. Small ossification is seen along the distal patellar tendon. Soft tissues Diffuse soft tissue edema about the knee more so anteriorly. IMPRESSION: Acute, closed, Schatzker type VI condylar fracture with transverse component through the metadiaphysis. The following findings are as noted below: 1. Moderate lipohemarthrosis associated with an acute comminuted fracture of the lateral tibial plateau with 1 cm of distraction in the transverse plane with 0.9 cm of posterolateral tibial plateau depression. The fracture undermines the tibial spines. 2. A comminuted apex posterior fracture of the proximal metadiaphysis of the tibia is noted with 16 degrees of anterior angulation of the distal fracture fragment. 3. Moderate soft tissue edema. Electronically Signed   By: Ashley Royalty M.D.   On: 01/16/2018 17:12   Dg Knee Complete 4 Views Left  Result Date: 01/16/2018 CLINICAL DATA:  Foot fell through a hole, with acute onset of left knee pain. Initial encounter. EXAM: LEFT KNEE - COMPLETE 4+ VIEW COMPARISON:  None. FINDINGS: There is a significantly comminuted fractures involving the proximal tibia and fibula. The tibial fracture extends  from the lateral tibial plateau and tibial spine into the proximal tibial diaphysis. There is approximately 2 cm of separation at the lateral tibial plateau. There is lateral displacement of the patella, which may reflect some degree of subluxation. Underlying lipohemarthrosis is noted. Soft tissue swelling is noted about the left knee and proximal left lower leg. IMPRESSION: 1. Significantly comminuted fractures involving the proximal tibia and fibula. Tibial fracture extends from the lateral tibial plateau and tibial spine into the proximal tibial diaphysis, with approximately 2 cm of separation at the lateral tibial plateau. 2. Lateral displacement of the patella may reflect some degree of subluxation with medial patellar retinaculum injury. 3. Underlying lipohemarthrosis. Electronically Signed   By: Garald Balding M.D.   On: 01/16/2018 16:48   Dg Hip Unilat W Or Wo Pelvis 2-3 Views Left  Result Date: 01/16/2018 CLINICAL DATA:  Acute onset of left leg pain after foot fell through a hole. Initial encounter. EXAM: DG HIP (WITH OR WITHOUT PELVIS) 2-3V LEFT COMPARISON:  None. FINDINGS: There is no evidence of fracture or dislocation. Both femoral heads are seated normally within their respective acetabula. The proximal left femur appears intact. No significant degenerative change is appreciated. The sacroiliac joints are unremarkable in appearance. The visualized bowel gas pattern is grossly unremarkable in appearance. IMPRESSION: No evidence of fracture or dislocation. Electronically Signed   By: Garald Balding M.D.   On: 01/16/2018 16:43   Dg Femur Min 2 Views  Left  Result Date: 01/16/2018 CLINICAL DATA:  Acute onset of left leg pain after foot fell through hole. Initial encounter. EXAM: LEFT FEMUR 2 VIEWS COMPARISON:  None. FINDINGS: The left femur appears intact. The proximal left femur is better characterized on concurrent left hip radiographs. There appears to be lateral subluxation of the patella. The  known tibial and fibular fractures are better characterized on concurrent left knee radiographs. Soft tissue swelling is noted about the knee. IMPRESSION: 1. Left femur appears intact. 2. Apparent lateral subluxation of the patella. Known tibial and fibular fractures are better characterized on concurrent left knee radiographs. Electronically Signed   By: Garald Balding M.D.   On: 01/16/2018 16:46   - pertinent xrays, CT, MRI studies were reviewed and independently interpreted  Positive ROS: All other systems have been reviewed and were otherwise negative with the exception of those mentioned in the HPI and as above.  Physical Exam: General: Alert, no acute distress Cardiovascular: No pedal edema Respiratory: No cyanosis, no use of accessory musculature GI: No organomegaly, abdomen is soft and non-tender Skin: No lesions in the area of chief complaint Neurologic: Sensation intact distally Psychiatric: Patient is competent for consent with normal mood and affect Lymphatic: No axillary or cervical lymphadenopathy  MUSCULOSKELETAL:  - strong distal pulses - compartments soft - NVI distally  Assessment: Left tibial plateau fracture  Plan: - to OR tonight for ex fix for initial stabilization - will need ORIF in a staged manner - will transfer to [place] postop - patient made aware of r/b/a to surgery - he has been NPO since 8 am today   N. Eduard Roux, MD [facility] [phone] 5:56 PM

## 2018-01-16 NOTE — Transfer of Care (Signed)
Immediate Anesthesia Transfer of Care Note  Patient: Tyler Walters  Procedure(s) Performed: LEFT KNEE SPANNING EXTERNAL FIXATION LEG (Left )  Patient Location: PACU  Anesthesia Type:General  Level of Consciousness: awake, alert  and oriented  Airway & Oxygen Therapy: Patient Spontanous Breathing and Patient connected to face mask oxygen  Post-op Assessment: Report given to RN and Post -op Vital signs reviewed and stable  Post vital signs: Reviewed and stable  Last Vitals:  Vitals:   01/16/18 1726 01/16/18 1857  BP: (!) 141/79 (!) 162/95  Pulse: (!) 104 (!) 111  Resp: 20 20  Temp:  37 C  SpO2: 98% 99%    Last Pain:  Vitals:   01/16/18 1857  TempSrc:   PainSc: 8          Complications: No apparent anesthesia complications

## 2018-01-17 ENCOUNTER — Inpatient Hospital Stay (HOSPITAL_COMMUNITY): Payer: Medicaid Other

## 2018-01-17 ENCOUNTER — Encounter (HOSPITAL_COMMUNITY): Payer: Self-pay | Admitting: Orthopaedic Surgery

## 2018-01-17 LAB — HIV ANTIBODY (ROUTINE TESTING W REFLEX): HIV Screen 4th Generation wRfx: NONREACTIVE

## 2018-01-17 LAB — SURGICAL PCR SCREEN
MRSA, PCR: NEGATIVE
Staphylococcus aureus: POSITIVE — AB

## 2018-01-17 MED ORDER — HYDROMORPHONE HCL 1 MG/ML IJ SOLN
1.0000 mg | INTRAMUSCULAR | Status: DC | PRN
  Administered 2018-01-18 – 2018-01-25 (×9): 1 mg via INTRAVENOUS
  Filled 2018-01-17 (×10): qty 1

## 2018-01-17 MED ORDER — MORPHINE SULFATE (PF) 2 MG/ML IV SOLN
0.5000 mg | INTRAVENOUS | Status: DC | PRN
  Administered 2018-01-19: 1 mg via INTRAVENOUS
  Filled 2018-01-17: qty 1

## 2018-01-17 NOTE — Progress Notes (Addendum)
Orthopaedic Trauma Service   Pt seen and evaluated Full consult note to come  Left Lower extremity is too swollen to allow for safe definitive surgical intervention tomorrow. Too much risk for infection Will still have [facility] transferred [facility] so the orthopaedic trauma service can maintain a watchful eye on him Possible OR midweek next week  My other concern is this enlarging lower abdominal growth, primarily on the R side. Pt states it has been present for many years (>10). He has not sought out medical attention due to lack of insurance coverage. He is attempting to get disability. 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 Pt does have additional smaller mobile growths on neck and anterior superior R chest wall   Ok for regular diet   NWB L leg  Jari Pigg, PA-C [facility] [phone] (C) [phone] (O) 01/17/2018 6:13 PM

## 2018-01-17 NOTE — Progress Notes (Signed)
Subjective: 1 Day Post-Op Procedure(s) (LRB): LEFT KNEE SPANNING EXTERNAL FIXATION LEG (Left) Patient reports pain as mild.  Patient noted decreased sensation to left foot earlier this afternoon.  Pain at 4/10.    Objective: Vital signs in last 24 hours: Temp:  [97.3 F (36.3 C)-98.6 F (37 C)] 98 F (36.7 C) (03/13 1407) Pulse Rate:  [76-111] 95 (03/13 1407) Resp:  [17-28] 21 (03/13 1407) BP: (105-166)/(72-97) 166/85 (03/13 1407) SpO2:  [95 %-100 %] 98 % (03/13 1407)  Intake/Output from previous day: 03/12 0701 - 03/13 0700 In: 3200 [P.O.:700; I.V.:2400; IV Piggyback:100] Out: 1750 [Urine:1750] Intake/Output this shift: Total I/O In: 720 [P.O.:720] Out: -   Recent Labs    01/16/18 1813  HGB 13.0   Recent Labs    01/16/18 1813  WBC 9.4  RBC 5.21  HCT 41.8  PLT 377   Recent Labs    01/16/18 1813  NA 141  K 3.9  CL 105  CO2 25  BUN 17  CREATININE 0.98  GLUCOSE 110*  CALCIUM 8.7*   No results for input(s): LABPT, INR in the last 72 hours.  Neurologically intact Neurovascular intact Intact pulses distally Dorsiflexion/Plantar flexion intact  Mild drainage from pin sites Moderate swelling Decreased sensation to L foot Good cap refill EHL/FHL intact   Assessment/Plan: 1 Day Post-Op Procedure(s) (LRB): LEFT KNEE SPANNING EXTERNAL FIXATION LEG (Left) Advance diet  Elevate for swelling Continue pin site care NWB LLE (bed rest) Awaiting transfer to [facility]  Dr. Marcelino Scot and his team will takeover care tomorrow NPO after midnight  Aundra Dubin 01/17/2018, 4:41 PM

## 2018-01-17 NOTE — Progress Notes (Signed)
Patient began complaining of decreased sensation on thigh, as well as below knee. Patient states "I can feel you touching me, but I cant tell how hard you are pressing"  Further assessment continued, patient had good capillary refill (less than 3 seconds), in all 5 toes, can dorsi-flex and plantar-flex L foot.   Patient reassured and attending MD, Dr. Erlinda Hong was called. MD reports that decreased sensation is common with increased swelling related to the fracture, as well as the external fixator.

## 2018-01-18 ENCOUNTER — Inpatient Hospital Stay (HOSPITAL_COMMUNITY): Payer: Medicaid Other

## 2018-01-18 ENCOUNTER — Encounter (HOSPITAL_COMMUNITY): Payer: Self-pay | Admitting: Orthopaedic Surgery

## 2018-01-18 DIAGNOSIS — D171 Benign lipomatous neoplasm of skin and subcutaneous tissue of trunk: Secondary | ICD-10-CM | POA: Insufficient documentation

## 2018-01-18 MED ORDER — IOPAMIDOL (ISOVUE-300) INJECTION 61%
30.0000 mL | Freq: Once | INTRAVENOUS | Status: AC | PRN
  Administered 2018-01-18: 30 mL via ORAL

## 2018-01-18 MED ORDER — IOPAMIDOL (ISOVUE-300) INJECTION 61%
INTRAVENOUS | Status: AC
  Administered 2018-01-18: 13:00:00 30 mL via ORAL
  Filled 2018-01-18: qty 30

## 2018-01-18 MED ORDER — ENOXAPARIN SODIUM 40 MG/0.4ML ~~LOC~~ SOLN: 40.0000 mg | SUBCUTANEOUS | Status: DC

## 2018-01-18 MED ORDER — IOPAMIDOL (ISOVUE-300) INJECTION 61%
INTRAVENOUS | Status: AC
  Filled 2018-01-18: qty 100

## 2018-01-18 NOTE — Progress Notes (Signed)
PT Cancellation Note  Patient Details Name: GAMAL TODISCO MRN: [id number] DOB: 1966/01/19   Cancelled Treatment:    Reason Eval/Treat Not Completed: Patient at procedure or test/unavailable   Weston Anna, MPT Pager: [phone]

## 2018-01-18 NOTE — Progress Notes (Signed)
OT Cancellation Note  Patient Details Name: Tyler Walters MRN: [id number] DOB: [birth year]   Cancelled Treatment:    Reason Eval/Treat Not Completed: Other (comment).  Pt has stat order for abd CT scan; will check back  Demri Poulton 01/18/2018, 1:05 PM  Lesle Chris, OTR/L [phone] 01/18/2018

## 2018-01-18 NOTE — Progress Notes (Signed)
Orthopedic Trauma Service Progress Note   Patient ID: Tyler Walters MRN: [id number] DOB/AGE: Mar 27, 1966 52 y.o.  Subjective:  Doing ok  Pain tolerable  Increased pain when pt was transferred to CT scanner   No other issues Voiding w/o issues  No BM since Monday but he stats he only has a BM 3 days a week  Growth off lower abdomen present for 5+ years but has been progressively growing  CT abdomen/pelvis completed w/o contrast as IV infiltrated and staff unable to get IV after 4 repeat attempts     Review of Systems  Constitutional: Negative for chills and fever.  Respiratory: Negative for shortness of breath and wheezing.   Cardiovascular: Negative for chest pain and palpitations.  Gastrointestinal: Negative for abdominal pain, nausea and vomiting.  Neurological: Negative for tingling and sensory change.      Objective:   VITALS:   Vitals:   01/17/18 1407 01/17/18 1924 01/18/18 0500 01/18/18 1327  BP: (!) 166/85 (!) 170/86 (!) 152/95 (!) 156/96  Pulse: 95 (!) 107 89 (!) 102  Resp: (!) 21 (!) 22 (!) 22 20  Temp: 98 F (36.7 C) 98.9 F (37.2 C) 97.7 F (36.5 C) 97.9 F (36.6 C)  TempSrc: Oral Oral Oral Oral  SpO2: 98% 100% 99% 98%  Weight:      Height:        Estimated body mass index is 62.34 kg/m as calculated from the following:   Height as of this encounter: 5\' 8"  (1.727 m).   Weight as of this encounter: 186 kg (410 lb).   Intake/Output      03/14 0701 - 03/15 0700   P.O. 600   I.V. (mL/kg)    Total Intake(mL/kg) 600 (3.2)   Net +600       Urine Occurrence 7 x   Stool Occurrence 0 x      PHYSICAL EXAM:   Gen: awake and alert, NAD appears well  Lungs: CTA B  Cardiac: RRR Abd: NT, +BS    Large abdominal mass lower quadrants   Asymmetric   Mass firm and callused on bottom  Ext:       Left Lower Extremity   Ex fix stable  Motor and sensory functions intact  Ext warm   + Radial pulse   swelling still pretty  substantial   No pain with passive stretching   Assessment/Plan: 2 Days Post-Op   Principal Problem:   Closed bicondylar fracture of left tibial plateau Active Problems:   Lipoma of abdominal wall   Anti-infectives (From admission, onward)   Start     Dose/Rate Route Frequency Ordered Stop   01/17/18 0200  ceFAZolin (ANCEF) IVPB 2g/100 mL premix     2 g 200 mL/hr over 30 Minutes Intravenous Every 6 hours 01/16/18 2226 01/17/18 1430   01/16/18 1915  ceFAZolin (ANCEF) 3 g in dextrose 5 % 50 mL IVPB    Comments:  Anesthesia to give preop   3 g 130 mL/hr over 30 Minutes Intravenous  Once 01/16/18 1859 01/16/18 1922    .  POD/HD#: 2  52 y/o male s/p fall off ladder with closed L bicondylar tibial plateau fracture   - closed L bicondylar tibial plateau fracture s/p Ex fix   Swelling too severe for OR this week   Hopeful for OR next week  NWB L leg   Ice and elevate L leg  Move toes and ankle   - Abdominal Mass  General surgery aware  Will evaluate in am   Will keep pt NPO   - Pain management:  Continue with current regimen   - ABL anemia/Hemodynamics  Stable   - DVT/PE prophylaxis:  Currently on lovenox  Will hold lovenox tonight   - Activity:  NWB L leg   - FEN/Foley/Lines:  Diet as tolerated   -Ex-fix/Splint care:  Ok to manipulate leg by ex fix   Pin care as needed   - Dispo:  General surgery eval in am   Anticipate Ortho procedure next week      Jari Pigg, PA-C [facility] [phone] (P) [phone] [name] (C) 01/18/2018, 8:23 PM

## 2018-01-18 NOTE — Anesthesia Postprocedure Evaluation (Signed)
Anesthesia Post Note  Patient: [name]  Procedure(s) Performed: LEFT KNEE SPANNING EXTERNAL FIXATION LEG (Left )     Patient location during evaluation: PACU Anesthesia Type: General Level of consciousness: awake and alert Pain management: pain level controlled Vital Signs Assessment: post-procedure vital signs reviewed and stable Respiratory status: spontaneous breathing, nonlabored ventilation, respiratory function stable and patient connected to nasal cannula oxygen Cardiovascular status: blood pressure returned to baseline and stable Postop Assessment: no apparent nausea or vomiting Anesthetic complications: no    Last Vitals:  Vitals:   01/17/18 1924 01/18/18 0500  BP: (!) 170/86 (!) 152/95  Pulse: (!) 107 89  Resp: (!) 22 (!) 22  Temp: 37.2 C 36.5 C  SpO2: 100% 99%    Last Pain:  Vitals:   01/18/18 0616  TempSrc:   PainSc: 6                  Iven Earnhart

## 2018-01-18 NOTE — Consult Note (Signed)
Orthopaedic Trauma Service (OTS) Consult   Patient ID: ILYAS Walters MRN: [id number] DOB/AGE: [birth year] 52 y.o.   Reason for Consult: Complex left bicondylar tibial plateau fracture Referring Physician:  Eduard Roux, MD ([facility])   HPI: Tyler Walters is an 52 y.o. morbidly obese black male who sustained a fall off of a ladder while trying to take down the car port on 01/16/2018.  Patient fell about 4 5 feet after getting his leg tangled in the rungs of the ladder.  Patient had immediate onset of pain and inability to bear weight on his left leg.  Patient was brought to [facility] via EMS for evaluation.  Patient was noted to have deformity to his left leg.  Orthopedics on-call was consulted.  Patient was found to have a complex and unstable left bicondylar tibial plateau fracture.  Patient was taken to the OR urgently for application of a spanning external fixator.  Due to the complexity of the injury the orthopedic trauma service was consulted for definitive management.  Patient was seen and evaluated by the orthopedic trauma service on 01/17/2018 to determine if he would be appropriate for the OR on 01/18/2018.  A transfer order to [facility] was also placed early morning on 01/17/2018 as patient would need to have surgery by the orthopedic trauma service and our OR time is only at [facility].  This would also expedite the patient's care.  Patient was seen and evaluated at [facility] by the orthopedic trauma service as his transfer did not happen.  Patient was seen and evaluated at [facility].  Very pleasant individual family and friends are at bedside.  He is very interactive and participatory with exam.  Complains of left leg pain.  He does have some baseline tingling in his left leg along the lateral side which is chronic in nature and also causes his leg to give out at times.  Patient is morbidly obese.  He has a very large lower abdominal growth it appears as his abdomen is  asymmetric.  There is a large pannus regrowth on the right side when compared to the contralateral side.  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  Patient states that he has never sought medical attention as he does not have insurance coverage.  His asymmetric abdomen does cause him to be unstable at times and so we suspect that this caused him to fall off a ladder.  Patient denies any new numbness or tingling.  He denies any additional injuries elsewhere  No chest pain or shortness of breath  Denies diabetes, heart disease, lung disease   Past Medical History:  Diagnosis Date  . Lipoma of abdominal wall    Large lipoma    Surgical history  Ex fix application L leg for tibial plateau fracture   History reviewed. No pertinent family history.  Social History:  reports that he has never smoked. He has never used smokeless tobacco. He reports that he does not drink alcohol or use drugs.   Patient states that he works in [employer]  However due to his abdominal growth he can only stand for 20-30 minutes max and needs to sit down after this  Allergies:  Allergies  Allergen Reactions  . Morphine And Related Nausea Only    Medications: I have reviewed the patient's current medications.  Results for orders placed or performed during the hospital encounter of 01/16/18 (from the past 48 hour(s))  CBC     Status: Abnormal   Collection Time: 01/25/18  6:41 AM  Result Value Ref Range   WBC 16.3 (H) 4.0 - 10.5 K/uL   RBC 3.66 (L) 4.22 - 5.81 MIL/uL   Hemoglobin 8.9 (L) 13.0 - 17.0 g/dL   HCT 29.0 (L) 39.0 - 52.0 %   MCV 79.2 78.0 - 100.0 fL   MCH 24.3 (L) 26.0 - 34.0 pg   MCHC 30.7 30.0 - 36.0 g/dL   RDW 16.1 (H) 11.5 - 15.5 %   Platelets 491 (H) 150 - 400 K/uL    Comment: Performed at [facility], [place] [address], [place], [place] [address]  CBC     Status: Abnormal   Collection Time: 01/26/18  8:56 AM  Result Value Ref Range   WBC 11.3 (H) 4.0  - 10.5 K/uL   RBC 3.55 (L) 4.22 - 5.81 MIL/uL   Hemoglobin 8.5 (L) 13.0 - 17.0 g/dL   HCT 28.3 (L) 39.0 - 52.0 %   MCV 79.7 78.0 - 100.0 fL   MCH 23.9 (L) 26.0 - 34.0 pg   MCHC 30.0 30.0 - 36.0 g/dL   RDW 16.3 (H) 11.5 - 15.5 %   Platelets 484 (H) 150 - 400 K/uL    Comment: Performed at [facility], [place] [address] [place], [place] [address]    Review of Systems  Constitutional: Negative for chills and fever.  Respiratory: Negative for shortness of breath and wheezing.   Cardiovascular: Negative for chest pain and orthopnea.  Gastrointestinal: Negative for abdominal pain, nausea and vomiting.  Neurological: Positive for tingling.       Chronic tingling left leg   Blood pressure (!) 143/93, pulse (!) 109, temperature 98.8 F (37.1 C), temperature source Oral, resp. rate (!) 24, height 5\' 8"  (1.727 m), weight (!) 147.9 kg (326 lb), SpO2 95 %. Physical Exam  Constitutional: Vital signs are normal. He is cooperative.  Obese black male Sitting up in bed, NAD   HENT:  Mouth/Throat: Abnormal dentition.  Eyes: Pupils are equal, round, and reactive to light. Conjunctivae are normal.  Neck: Normal range of motion and full passive range of motion without pain.  Marble sized, mobile growth R side of neck Nontender, rubbery texture   Cardiovascular: Normal rate, regular rhythm, S1 normal and S2 normal.  Pulmonary/Chest: Effort normal and breath sounds normal. No apnea. No respiratory distress. He has no wheezes.  Larger mobile mass anterior lateral upper R chest wall, maybe size of a smaller orange Nontender, freely mobile   Abdominal:  Abnormal appearance  Asymmetric with R side hanging lower than Left Chronic skin changes noted to R side, very rough and indurated  No erythema or active wounds noted  Abdomen is nontender I do not appreciate any sounds from this mass   Musculoskeletal:  Left Lower Extremity  Ex fix in place pinsites are stable Significant swelling present to  L leg Skin does not wrinkle over surgical areas No fracture blisters noted  DPN, SPN, TN sensation grossly intact EHL, FHL, AT, PT, peroneals, gastroc motor intact  Ext warm  + DP pulse  No pain with passive stretching of leg compartments Ankle and foot are nontender  No DCT  Hip is w/o acute findings   Right Lower Extremity  No traumatic wounds, ecchymosis, or rash             No pain with axial loading or log rolling of hip  Knee nontender, no crepitus              Ankle nontender, no crepitus    No knee or ankle effusion  Knee stable to varus/ valgus and anterior/posterior stress             Ankle stable with ligamentous stressing   Sens DPN, SPN, TN intact  Motor EHL, ext, flex, evers 5/5  DP 2+, PT 2+, No significant edema  Bilateral upper extremities   shoulder, elbow, wrist, digits- no skin wounds, nontender, no instability, no blocks to motion  Sens  Ax/R/M/U intact  Mot   Ax/ R/ PIN/ M/ AIN/ U intact  Rad 2+    Neurological: He is alert.  Psychiatric: He has a normal mood and affect. His speech is normal. Cognition and memory are normal.     Assessment/Plan:  52 y/o obese black male s/p fall off ladder with complex L bicondylar tibial plateau fracture   - Fall   -complex Left bicondylar tibial plateau fracture with shaft extension s/p Ex Fix  Will need formal ORIF however he is too swollen for surgery tomorrow. Would anticipate OR early next week  Daily pin care  Ice and elevate leg   Compressive wrap from Foot to above knee  PT/OT    Transfer to [facility]  - Abdominal mass, chest wall mass, neck mass  General surgery consult  CT of abdomen to further characterize mass   - Pain management:  Continue with current regimen and adjust accordingly   - ABL anemia/Hemodynamics  Stable  - Medical issues   No chronic issues by report but has also not been to a doctor in years  Monitor   - DVT/PE prophylaxis:  Weight based lovenox for  prophylaxis   - FEN/GI prophylaxis/Foley/Lines:  Reg diet   KVO IVF  - Dispo:  Transfer to [facility]   Ortho procedure likely next week  General surgery consult    Jari Pigg, PA-C [facility] [phone] (C) [phone] (O) 01/26/2018, 12:21 PM

## 2018-01-19 ENCOUNTER — Encounter (HOSPITAL_COMMUNITY)
Admission: EM | Disposition: A | Discharge status: Rehabilitation Facility | Payer: Self-pay | Source: Home / Self Care | Attending: Orthopedic Surgery

## 2018-01-19 ENCOUNTER — Encounter (HOSPITAL_COMMUNITY): Payer: Self-pay | Admitting: *Deleted

## 2018-01-19 ENCOUNTER — Inpatient Hospital Stay (HOSPITAL_COMMUNITY): Payer: Medicaid Other | Admitting: Certified Registered"

## 2018-01-19 DIAGNOSIS — E65 Localized adiposity: Secondary | ICD-10-CM | POA: Diagnosis present

## 2018-01-19 HISTORY — PX: PANNICULECTOMY: SHX5360

## 2018-01-19 HISTORY — PX: LIPOMA EXCISION: SHX5283

## 2018-01-19 SURGERY — EXCISION LIPOMA
Anesthesia: General | Site: Chest | Laterality: Right

## 2018-01-19 MED ORDER — LIDOCAINE HCL (CARDIAC) 20 MG/ML IV SOLN
INTRAVENOUS | Status: AC
  Filled 2018-01-19: qty 5

## 2018-01-19 MED ORDER — DEXAMETHASONE SODIUM PHOSPHATE 10 MG/ML IJ SOLN
INTRAMUSCULAR | Status: DC | PRN
  Administered 2018-01-19: 10 mg via INTRAVENOUS

## 2018-01-19 MED ORDER — EPHEDRINE SULFATE-NACL 50-0.9 MG/10ML-% IV SOSY
PREFILLED_SYRINGE | INTRAVENOUS | Status: DC | PRN
Start: 2018-01-19 — End: 2018-01-19
  Administered 2018-01-19 (×3): 10 mg via INTRAVENOUS

## 2018-01-19 MED ORDER — PHENYLEPHRINE 40 MCG/ML (10ML) SYRINGE FOR IV PUSH (FOR BLOOD PRESSURE SUPPORT)
PREFILLED_SYRINGE | INTRAVENOUS | Status: DC | PRN
  Administered 2018-01-19 (×2): 120 ug via INTRAVENOUS
  Administered 2018-01-19: 80 ug via INTRAVENOUS
  Administered 2018-01-19: 120 ug via INTRAVENOUS
  Administered 2018-01-19: 80 ug via INTRAVENOUS

## 2018-01-19 MED ORDER — LIDOCAINE HCL (CARDIAC) 20 MG/ML IV SOLN
INTRAVENOUS | Status: DC | PRN
  Administered 2018-01-19: 100 mg via INTRAVENOUS

## 2018-01-19 MED ORDER — MIDAZOLAM HCL 5 MG/5ML IJ SOLN
INTRAMUSCULAR | Status: DC | PRN
  Administered 2018-01-19 (×2): 1 mg via INTRAVENOUS

## 2018-01-19 MED ORDER — ROCURONIUM BROMIDE 100 MG/10ML IV SOLN
INTRAVENOUS | Status: DC | PRN
  Administered 2018-01-19 (×2): 20 mg via INTRAVENOUS
  Administered 2018-01-19: 60 mg via INTRAVENOUS
  Administered 2018-01-19 (×3): 20 mg via INTRAVENOUS

## 2018-01-19 MED ORDER — BUPIVACAINE-EPINEPHRINE (PF) 0.25% -1:200000 IJ SOLN
INTRAMUSCULAR | Status: DC | PRN
  Administered 2018-01-19: 10 mL

## 2018-01-19 MED ORDER — PHENYLEPHRINE HCL 10 MG/ML IJ SOLN
INTRAMUSCULAR | Status: AC
  Filled 2018-01-19: qty 1

## 2018-01-19 MED ORDER — OXYCODONE HCL 5 MG PO TABS: 5.0000 mg | ORAL_TABLET | Freq: Once | ORAL | Status: DC | PRN

## 2018-01-19 MED ORDER — BUPIVACAINE-EPINEPHRINE 0.25% -1:200000 IJ SOLN
INTRAMUSCULAR | Status: AC
  Filled 2018-01-19: qty 1

## 2018-01-19 MED ORDER — FENTANYL CITRATE (PF) 100 MCG/2ML IJ SOLN: 25.0000 ug | INTRAMUSCULAR | Status: DC | PRN

## 2018-01-19 MED ORDER — MEPERIDINE HCL 50 MG/ML IJ SOLN: 6.2500 mg | INTRAMUSCULAR | Status: DC | PRN

## 2018-01-19 MED ORDER — DEXAMETHASONE SODIUM PHOSPHATE 10 MG/ML IJ SOLN
INTRAMUSCULAR | Status: AC
  Filled 2018-01-19: qty 1

## 2018-01-19 MED ORDER — PHENYLEPHRINE 40 MCG/ML (10ML) SYRINGE FOR IV PUSH (FOR BLOOD PRESSURE SUPPORT)
PREFILLED_SYRINGE | INTRAVENOUS | Status: AC
  Filled 2018-01-19: qty 10

## 2018-01-19 MED ORDER — OXYCODONE HCL 5 MG/5ML PO SOLN: 5.0000 mg | Freq: Once | ORAL | Status: DC | PRN

## 2018-01-19 MED ORDER — DEXTROSE 5 % IV SOLN
3.0000 g | INTRAVENOUS | Status: AC
  Filled 2018-01-19: qty 3000

## 2018-01-19 MED ORDER — ROCURONIUM BROMIDE 10 MG/ML (PF) SYRINGE
PREFILLED_SYRINGE | INTRAVENOUS | Status: AC
  Filled 2018-01-19: qty 10

## 2018-01-19 MED ORDER — KETOROLAC TROMETHAMINE 30 MG/ML IJ SOLN: 30.0000 mg | Freq: Once | INTRAMUSCULAR | Status: DC | PRN

## 2018-01-19 MED ORDER — DEXTROSE 5 % IV SOLN
3.0000 g | INTRAVENOUS | Status: AC
  Administered 2018-01-19: 3 g via INTRAVENOUS
  Filled 2018-01-19: qty 3

## 2018-01-19 MED ORDER — PROPOFOL 10 MG/ML IV BOLUS
INTRAVENOUS | Status: AC
  Filled 2018-01-19: qty 20

## 2018-01-19 MED ORDER — 0.9 % SODIUM CHLORIDE (POUR BTL) OPTIME
TOPICAL | Status: DC | PRN
  Administered 2018-01-19: 1000 mL

## 2018-01-19 MED ORDER — MIDAZOLAM HCL 2 MG/2ML IJ SOLN
INTRAMUSCULAR | Status: AC
  Filled 2018-01-19: qty 2

## 2018-01-19 MED ORDER — ALBUMIN HUMAN 5 % IV SOLN
INTRAVENOUS | Status: DC | PRN
  Administered 2018-01-19 (×2): via INTRAVENOUS

## 2018-01-19 MED ORDER — SODIUM CHLORIDE 0.9 % IV SOLN
INTRAVENOUS | Status: DC | PRN
  Administered 2018-01-19: 12:00:00 via INTRAVENOUS

## 2018-01-19 MED ORDER — LACTATED RINGERS IV SOLN
INTRAVENOUS | Status: DC | PRN
  Administered 2018-01-19: 14:00:00 via INTRAVENOUS

## 2018-01-19 MED ORDER — LACTATED RINGERS IV SOLN
INTRAVENOUS | Status: DC
  Administered 2018-01-19: 10:00:00 via INTRAVENOUS

## 2018-01-19 MED ORDER — EPHEDRINE 5 MG/ML INJ
INTRAVENOUS | Status: AC
Start: 2018-01-19 — End: ?
  Filled 2018-01-19: qty 10

## 2018-01-19 MED ORDER — SUGAMMADEX SODIUM 500 MG/5ML IV SOLN
INTRAVENOUS | Status: AC
  Filled 2018-01-19: qty 5

## 2018-01-19 MED ORDER — ACETAMINOPHEN 325 MG PO TABS: 325.0000 mg | ORAL_TABLET | ORAL | Status: DC | PRN

## 2018-01-19 MED ORDER — ONDANSETRON HCL 4 MG/2ML IJ SOLN: 4.0000 mg | Freq: Once | INTRAMUSCULAR | Status: DC | PRN

## 2018-01-19 MED ORDER — SUGAMMADEX SODIUM 500 MG/5ML IV SOLN
INTRAVENOUS | Status: DC | PRN
  Administered 2018-01-19: 300 mg via INTRAVENOUS

## 2018-01-19 MED ORDER — ACETAMINOPHEN 160 MG/5ML PO SOLN: 325.0000 mg | ORAL | Status: DC | PRN

## 2018-01-19 MED ORDER — FENTANYL CITRATE (PF) 100 MCG/2ML IJ SOLN
INTRAMUSCULAR | Status: DC | PRN
  Administered 2018-01-19 (×2): 50 ug via INTRAVENOUS
  Administered 2018-01-19: 100 ug via INTRAVENOUS

## 2018-01-19 MED ORDER — PROPOFOL 10 MG/ML IV BOLUS
INTRAVENOUS | Status: DC | PRN
  Administered 2018-01-19: 400 mg via INTRAVENOUS

## 2018-01-19 MED ORDER — ONDANSETRON HCL 4 MG/2ML IJ SOLN
INTRAMUSCULAR | Status: AC
  Filled 2018-01-19: qty 2

## 2018-01-19 MED ORDER — PHENYLEPHRINE HCL 10 MG/ML IJ SOLN
INTRAVENOUS | Status: DC | PRN
  Administered 2018-01-19: 50 ug/min via INTRAVENOUS

## 2018-01-19 MED ORDER — ENOXAPARIN SODIUM 40 MG/0.4ML ~~LOC~~ SOLN
40.0000 mg | SUBCUTANEOUS | Status: DC
  Filled 2018-01-19: qty 0.4

## 2018-01-19 MED ORDER — MUPIROCIN 2 % EX OINT
1.0000 "application " | TOPICAL_OINTMENT | Freq: Two times a day (BID) | CUTANEOUS | Status: AC
  Administered 2018-01-19 – 2018-01-21 (×4): 1 via NASAL
  Filled 2018-01-19 (×2): qty 22

## 2018-01-19 MED ORDER — FENTANYL CITRATE (PF) 250 MCG/5ML IJ SOLN
INTRAMUSCULAR | Status: AC
  Filled 2018-01-19: qty 5

## 2018-01-19 MED ORDER — DEXMEDETOMIDINE HCL IN NACL 200 MCG/50ML IV SOLN
INTRAVENOUS | Status: DC | PRN
  Administered 2018-01-19 (×3): 8 ug via INTRAVENOUS
  Administered 2018-01-19: 4 ug via INTRAVENOUS
  Administered 2018-01-19: 8 ug via INTRAVENOUS
  Administered 2018-01-19: 4 ug via INTRAVENOUS
  Administered 2018-01-19: 12 ug via INTRAVENOUS
  Administered 2018-01-19: 8 ug via INTRAVENOUS
  Administered 2018-01-19: 4 ug via INTRAVENOUS
  Administered 2018-01-19: 8 ug via INTRAVENOUS
  Administered 2018-01-19 (×2): 12 ug via INTRAVENOUS
  Administered 2018-01-19 (×2): 4 ug via INTRAVENOUS
  Administered 2018-01-19: 8 ug via INTRAVENOUS
  Administered 2018-01-19: 20 ug via INTRAVENOUS
  Administered 2018-01-19: 8 ug via INTRAVENOUS

## 2018-01-19 SURGICAL SUPPLY — 67 items
APL SKNCLS STERI-STRIP NONHPOA (GAUZE/BANDAGES/DRESSINGS) ×3
BENZOIN TINCTURE PRP APPL 2/3 (GAUZE/BANDAGES/DRESSINGS) ×3 IMPLANT
BLADE CLIPPER SURG (BLADE) ×3 IMPLANT
BNDG GAUZE ELAST 4 BULKY (GAUZE/BANDAGES/DRESSINGS) ×6 IMPLANT
CANISTER SUCT 3000ML PPV (MISCELLANEOUS) ×5 IMPLANT
CANISTER WOUND CARE 500ML ATS (WOUND CARE) ×3 IMPLANT
CHLORAPREP W/TINT 26ML (MISCELLANEOUS) ×20 IMPLANT
CLOSURE WOUND 1/2 X4 (GAUZE/BANDAGES/DRESSINGS) ×1
COVER SURGICAL LIGHT HANDLE (MISCELLANEOUS) ×5 IMPLANT
DRAIN CHANNEL 19F RND (DRAIN) ×6 IMPLANT
DRAPE LAPAROTOMY 100X72 PEDS (DRAPES) ×3 IMPLANT
DRAPE PROXIMA HALF (DRAPES) ×3 IMPLANT
DRAPE UTILITY XL STRL (DRAPES) ×9 IMPLANT
DRESSING PREVENA PLUS CUSTOM (GAUZE/BANDAGES/DRESSINGS) ×1 IMPLANT
DRSG PREVENA PLUS CUSTOM (GAUZE/BANDAGES/DRESSINGS) ×5
DRSG TEGADERM 4X4.75 (GAUZE/BANDAGES/DRESSINGS) ×3 IMPLANT
ELECT BLADE 4.0 EZ CLEAN MEGAD (MISCELLANEOUS) ×5
ELECT BLADE 6.5 EXT (BLADE) ×3 IMPLANT
ELECT CAUTERY BLADE 6.4 (BLADE) ×10 IMPLANT
ELECT REM PT RETURN 9FT ADLT (ELECTROSURGICAL) ×10
ELECTRODE BLDE 4.0 EZ CLN MEGD (MISCELLANEOUS) ×1 IMPLANT
ELECTRODE REM PT RTRN 9FT ADLT (ELECTROSURGICAL) ×4 IMPLANT
EVACUATOR SILICONE 100CC (DRAIN) ×6 IMPLANT
GAUZE SPONGE 4X4 12PLY STRL (GAUZE/BANDAGES/DRESSINGS) ×3 IMPLANT
GLOVE BIO SURGEON STRL SZ 6.5 (GLOVE) ×2 IMPLANT
GLOVE BIO SURGEON STRL SZ7 (GLOVE) ×8 IMPLANT
GLOVE BIO SURGEON STRL SZ8 (GLOVE) ×3 IMPLANT
GLOVE BIO SURGEONS STRL SZ 6.5 (GLOVE) ×1
GLOVE BIOGEL PI IND STRL 6 (GLOVE) ×2 IMPLANT
GLOVE BIOGEL PI IND STRL 6.5 (GLOVE) ×1 IMPLANT
GLOVE BIOGEL PI IND STRL 7.5 (GLOVE) ×5 IMPLANT
GLOVE BIOGEL PI IND STRL 8 (GLOVE) ×4 IMPLANT
GLOVE BIOGEL PI IND STRL 8.5 (GLOVE) ×2 IMPLANT
GLOVE BIOGEL PI INDICATOR 6 (GLOVE) ×4
GLOVE BIOGEL PI INDICATOR 6.5 (GLOVE) ×2
GLOVE BIOGEL PI INDICATOR 7.5 (GLOVE) ×6
GLOVE BIOGEL PI INDICATOR 8 (GLOVE) ×8
GLOVE BIOGEL PI INDICATOR 8.5 (GLOVE) ×4
GLOVE ECLIPSE 7.5 STRL STRAW (GLOVE) ×3 IMPLANT
GLOVE ECLIPSE 8.0 STRL XLNG CF (GLOVE) ×6 IMPLANT
GLOVE SURG SS PI 6.0 STRL IVOR (GLOVE) ×9 IMPLANT
GLOVE SURG SS PI 6.5 STRL IVOR (GLOVE) ×6 IMPLANT
GOWN STRL REUS W/ TWL LRG LVL3 (GOWN DISPOSABLE) ×10 IMPLANT
GOWN STRL REUS W/ TWL XL LVL3 (GOWN DISPOSABLE) ×2 IMPLANT
GOWN STRL REUS W/TWL LRG LVL3 (GOWN DISPOSABLE) ×30
GOWN STRL REUS W/TWL XL LVL3 (GOWN DISPOSABLE) ×10
KIT BASIN OR (CUSTOM PROCEDURE TRAY) ×5 IMPLANT
LIGASURE IMPACT 36 18CM CVD LR (INSTRUMENTS) ×3 IMPLANT
NDL HYPO 25GX1X1/2 BEV (NEEDLE) IMPLANT
NEEDLE HYPO 25GX1X1/2 BEV (NEEDLE) ×5 IMPLANT
NS IRRIG 1000ML POUR BTL (IV SOLUTION) ×7 IMPLANT
PACK GENERAL/GYN (CUSTOM PROCEDURE TRAY) ×5 IMPLANT
PACK UNIVERSAL I (CUSTOM PROCEDURE TRAY) ×3 IMPLANT
PAD ARMBOARD 7.5X6 YLW CONV (MISCELLANEOUS) ×5 IMPLANT
PENCIL BUTTON HOLSTER BLD 10FT (ELECTRODE) ×3 IMPLANT
PREVENA INCISION MGT 90 150 (MISCELLANEOUS) ×3 IMPLANT
SOLUTION BETADINE 4OZ (MISCELLANEOUS) ×3 IMPLANT
SPONGE LAP 18X18 X RAY DECT (DISPOSABLE) ×9 IMPLANT
STAPLER VISISTAT 35W (STAPLE) ×3 IMPLANT
STRIP CLOSURE SKIN 1/2X4 (GAUZE/BANDAGES/DRESSINGS) ×2 IMPLANT
SUT ETHILON 2 0 FS 18 (SUTURE) ×6 IMPLANT
SUT ETHILON 2 0 PSLX (SUTURE) ×30 IMPLANT
SUT MNCRL AB 4-0 PS2 18 (SUTURE) ×3 IMPLANT
SUT SILK 2 0 SH CR/8 (SUTURE) ×3 IMPLANT
SUT SILK 2 0 TIES 10X30 (SUTURE) ×3 IMPLANT
SUT VIC AB 3-0 SH 18 (SUTURE) ×9 IMPLANT
SYR CONTROL 10ML LL (SYRINGE) ×3 IMPLANT

## 2018-01-19 NOTE — Anesthesia Procedure Notes (Signed)
Procedure Name: Intubation Date/Time: 01/19/2018 11:50 AM Performed by: Candis Shine, CRNA Pre-anesthesia Checklist: Patient identified, Emergency Drugs available, Suction available and Patient being monitored Patient Re-evaluated:Patient Re-evaluated prior to induction Oxygen Delivery Method: Circle System Utilized Preoxygenation: Pre-oxygenation with 100% oxygen Induction Type: IV induction Ventilation: Mask ventilation without difficulty Laryngoscope Size: Glidescope and 4 Grade View: Grade I Tube type: Oral Tube size: 7.5 mm Number of attempts: 2 Airway Equipment and Method: Stylet Placement Confirmation: ETT inserted through vocal cords under direct vision,  positive ETCO2 and breath sounds checked- equal and bilateral Secured at: 23 cm Tube secured with: Tape Dental Injury: Teeth and Oropharynx as per pre-operative assessment  Difficulty Due To: Difficult Airway- due to reduced neck mobility and Difficult Airway- due to anterior larynx

## 2018-01-19 NOTE — Progress Notes (Addendum)
Orthopedic Trauma Service Progress Note   Patient ID: Tyler Walters MRN: [id number] DOB/AGE: Mar 28, 1966 52 y.o.  Subjective:  Doing ok this evening  Abdomen is sore  Good spirits  Left leg pain tolerable    ROS As above   Objective:   VITALS:   Vitals:   01/19/18 1617 01/19/18 1630 01/19/18 1645 01/19/18 1700  BP: (!) 157/89 (!) 154/90 (!) 154/96 (!) 159/98  Pulse: (!) 104 98 98 96  Resp: 18 (!) 33 (!) 28 (!) 32  Temp: (!) 97.2 F (36.2 C)   (!) 97.3 F (36.3 C)  TempSrc:      SpO2: 95% 93% 96% 94%  Weight:      Height:        Estimated body mass index is 62.34 kg/m as calculated from the following:   Height as of this encounter: 5\' 8"  (1.727 m).   Weight as of this encounter: 186 kg (410 lb).   Intake/Output      03/15 0701 - 03/16 0700   P.O.    I.V. (mL/kg) 2700 (14.5)   IV Piggyback 500   Total Intake(mL/kg) 3200 (17.2)   Urine (mL/kg/hr) 400 (0.2)   Drains 35   Blood 150   Total Output 585   Net +2615         LABS  No results found for this or any previous visit (from the past 24 hour(s)).   PHYSICAL EXAM:    Gen: appears well, NAD Lungs: breathing unlabored Cardiac: regular  Ext:       Left Lower Extremity   Dressing stable  Ex fix stable  Ext warm   + DP pulse   Motor and sensory intact    Assessment/Plan: Day of Surgery   Principal Problem:   Closed bicondylar fracture of left tibial plateau Active Problems:   Lipoma of abdominal wall   Anti-infectives (From admission, onward)   Start     Dose/Rate Route Frequency Ordered Stop   01/19/18 1800  ceFAZolin (ANCEF) 3 g in dextrose 5 % 50 mL IVPB     3 g 160 mL/hr over 30 Minutes Intravenous To ShortStay Surgical 01/19/18 1720 01/20/18 1800   01/19/18 1200  ceFAZolin (ANCEF) 3 g in dextrose 5 % 50 mL IVPB     3 g 160 mL/hr over 30 Minutes Intravenous To ShortStay Surgical 01/19/18 1157 01/19/18 1202   01/17/18 0200  ceFAZolin (ANCEF) IVPB 2g/100 mL  premix     2 g 200 mL/hr over 30 Minutes Intravenous Every 6 hours 01/16/18 2226 01/17/18 1430   01/16/18 1915  ceFAZolin (ANCEF) 3 g in dextrose 5 % 50 mL IVPB    Comments:  Anesthesia to give preop   3 g 130 mL/hr over 30 Minutes Intravenous  Once 01/16/18 1859 01/16/18 1922    .  POD/HD#: 35     52 y/o male s/p fall off ladder with closed L bicondylar tibial plateau fracture    - closed L bicondylar tibial plateau fracture s/p Ex fix              Swelling too severe for OR this week              Hopeful for OR next week             NWB L leg              Ice and elevate L leg  Move toes and ankle    - massive abdominal pannus, R chest wall lipoma s/p panniculectomy and excision R chest wall lipoma/mass             per general surgery    - Pain management:             Continue with current regimen    - ABL anemia/Hemodynamics             Stable    - DVT/PE prophylaxis:             resume lovenox in am    - Activity:             NWB L leg    - FEN/Foley/Lines:             clear diet    -Ex-fix/Splint care:             Ok to manipulate leg by ex fix              Pin care as needed    - Dispo:            likely return to OR next week for repair or Left tibial plateau   With panniculectomy it should be easier and more safe for pt to mobilize    Jari Pigg, PA-C [facility] [phone] (O) [phone] (C) 01/19/2018, 8:01 PM

## 2018-01-19 NOTE — Progress Notes (Signed)
Pt has a foley and he complains about pain 10/10 in his bladder and his penis. MD Kinsinger was paged. A verbal order for irrigation of the foley was given. Pt tolerated it well. Foley flushes good. Urine output is > 500 ml for the night shift. RN will continue to monitor.

## 2018-01-19 NOTE — Anesthesia Preprocedure Evaluation (Addendum)
Anesthesia Evaluation  Patient identified by MRN, date of birth, ID band Patient awake    Reviewed: Allergy & Precautions, NPO status , Patient's Chart, lab work & pertinent test results  Airway Mallampati: II  TM Distance: >3 FB Neck ROM: Full    Dental no notable dental hx.    Pulmonary neg pulmonary ROS,    Pulmonary exam normal breath sounds clear to auscultation       Cardiovascular negative cardio ROS Normal cardiovascular exam Rhythm:Regular Rate:Normal     Neuro/Psych negative neurological ROS  negative psych ROS   GI/Hepatic negative GI ROS, Neg liver ROS,   Endo/Other  Morbid obesity  Renal/GU negative Renal ROS  negative genitourinary   Musculoskeletal negative musculoskeletal ROS (+)   Abdominal   Peds negative pediatric ROS (+)  Hematology negative hematology ROS (+)   Anesthesia Other Findings   Reproductive/Obstetrics negative OB ROS                             Anesthesia Physical  Anesthesia Plan  ASA: III  Anesthesia Plan: General   Post-op Pain Management:    Induction: Intravenous  PONV Risk Score and Plan: 1 and Ondansetron and Treatment may vary due to age or medical condition  Airway Management Planned: LMA and Oral ETT  Additional Equipment:   Intra-op Plan:   Post-operative Plan: Extubation in OR  Informed Consent: I have reviewed the patients History and Physical, chart, labs and discussed the procedure including the risks, benefits and alternatives for the proposed anesthesia with the patient or authorized representative who has indicated his/her understanding and acceptance.     Plan Discussed with: CRNA, Surgeon and Anesthesiologist  Anesthesia Plan Comments: ( )       Anesthesia Quick Evaluation

## 2018-01-19 NOTE — Op Note (Signed)
Pre-op diagnosis: Large right abdominal wall subcutaneous mass with massive pannus    Right chest wall mass (4 cm) Postop diagnosis: Subcutaneous lipoma right chest wall (4 cm)    Massive pannus Procedure performed: Excision of right abdominal wall subcutaneous mass with panniculectomy Excision of right chest wall mass Surgeon:Jennings Corado K Kenly Henckel Anesthesia: General endotracheal Indications: This is a 52 year old male who was admitted to the hospital recently with a left tibial plateau fracture.  Over the last 5 years he has developed a massive subcutaneous mass in the right abdominal wall.  He has developed a very large pannus.  When the patient is supine in the bed the pannus rests on the bed between his legs.  The skin has become thickened.  There is concern for an enlarging mass in the subcutaneous space.  CT scan confirmed no sign of intra-abdominal process.  The patient also needs a panniculectomy to help with his upcoming Orthopedic surgery and rehab.  He also has a palpable mass on the right side of his chest near the axilla.  Description of procedure: The patient is brought to the operating room and placed in the supine position on the operating room table.  After an adequate level of general anesthesia was obtained we shaved his right chest and prepped with ChloraPrep.  We draped in sterile fashion.  A timeout was taken to ensure the proper patient and proper procedure.  We infiltrated the area over the palpable mass with 0.25% Marcaine with epinephrine.  A transverse incision was made.  We dissected down the subcutaneous tissues until we encountered a large lipoma.  The lipoma was removed entirely.  This was sent for pathologic examination.  The wound was closed with a deep layer of 3-0 Vicryl.  4-0 Monocryl was used to close the skin.  Benzoin Steri-Strips were applied.  We then took down all of our sterile drapes.  We prepped the entire abdomen with ChloraPrep and Betadine.  It took several  people to elevate the pannus so we could prep underneath pannus and on his upper thighs and groin.  His umbilicus was prepped with Betadine.  We outlined a transverse incision coming across just above the umbilicus.  It appears that if we dissect directly posterior from this incision then we should encounter a similar incision made from hip to hip in the suprapubic region.  We made our transverse incision just below the umbilicus.  Dissection was carried down through the dermis with cautery.  We then used the LigaSure device to divide the very thickened fatty tissue.  There were several large veins within the adipose tissue.  We continued dissecting down laterally on the right side.  We opened our inferior incision with a scalpel and dissected superiorly with the LigaSure device.  We encountered our dissection from the anterior approach.  We did use the Omni-Tract device as well as some towel clamps and the skin to help elevate the very heavy pannus.  We continued dissecting laterally to the left.  We completely excised the pannus.  I never encountered what appeared to be any firm soft tissue mass.  There is no obvious separate lipoma.  We excised the entire pannus and sent this for pathologic examination.  We inspected carefully for hemostasis.  The wound was irrigated.  We placed drains in the subcutaneous space entering through stab incisions on each side.  We reapproximated the skin and subcutaneous tissue with multiple interrupted 2-0 nylon sutures.  Staples were then used to reapproximate the skin.  We used a Prevena VAC to seal off the entire staple line.  The drains were placed to bulb suction after being secured with 2-0 nylon sutures.  The patient was then extubated and brought to the recovery room in stable condition.  All sponge, instrument, and needle counts are correct.  Imogene Burn. Georgette Dover, MD, [facility]  General/ Trauma Surgery  01/19/2018 4:18 PM

## 2018-01-19 NOTE — Consult Note (Signed)
Reason for Consult:  Massive abdominal wall mass Referring Physician: Dr. Osborn Coho PA-C  Tyler Walters is an 52 y.o. male.  HPI: This is a 52 yo male with obesity who presents after recent fall and left tibial plateau fracture.  He is awaiting definitive repair of the fracture and has an ex fix in place.  The patient reports that in 2014, he weighed about 180 lbs.  He developed an asymmetric growth in the abdominal wall on the lower right side.  Because of financial reasons, he did not seek medical attention.  This area has become massive and his weight has increased to over 400 lbs.  He has significant pain in his left leg and ankle at baseline.  His left ankle gave out when he was on the ladder, resulting in the fall.  About two years ago, he developed some drainage from his umbilicus that he manages with peroxide and dry gauze.   Past Medical History:  Diagnosis Date  . Lipoma of abdominal wall    Large lipoma    Past Surgical History:  Procedure Laterality Date  . EXTERNAL FIXATION LEG Left 01/16/2018   Procedure: LEFT KNEE SPANNING EXTERNAL FIXATION LEG;  Surgeon: Leandrew Koyanagi, MD;  Location: [facility];  Service: Orthopedics;  Laterality: Left;    History reviewed. No pertinent family history.  Social History:  reports that  has never smoked. he has never used smokeless tobacco. He reports that he does not drink alcohol or use drugs.  Allergies:  Allergies  Allergen Reactions  . Morphine And Related Nausea Only    Medications:  Prior to Admission medications   Medication Sig Start Date End Date Taking? Authorizing Provider  docusate sodium (COLACE) 100 MG capsule Take 1 capsule (100 mg total) by mouth every 12 (twelve) hours. Patient not taking: Reported on 01/16/2018 12/27/16   Dalia Heading, PA-C  oxyCODONE (ROXICODONE) 5 MG immediate release tablet Take 1 tablet (5 mg total) by mouth every 4 (four) hours as needed for severe pain. Patient not taking: Reported on  01/16/2018 12/23/16   Deno Etienne, DO  tamsulosin (FLOMAX) 0.4 MG CAPS capsule Take 1 capsule (0.4 mg total) by mouth daily after supper. Patient not taking: Reported on 01/16/2018 12/23/16   Deno Etienne, DO     No results found for this or any previous visit (from the past 59 hour(s)).  Ct Abdomen Pelvis Wo Contrast  Result Date: 01/18/2018 CLINICAL DATA:  Right-sided abdominal wall mass. EXAM: CT ABDOMEN AND PELVIS WITHOUT CONTRAST TECHNIQUE: Multidetector CT imaging of the abdomen and pelvis was performed following the standard protocol without IV contrast. COMPARISON:  CT abdomen and pelvis dated December 23, 2016. FINDINGS: Lower chest: Bibasilar atelectasis. Hepatobiliary: No focal liver abnormality is seen. No gallstones, gallbladder wall thickening, or biliary dilatation. Pancreas: Unremarkable. No pancreatic ductal dilatation or surrounding inflammatory changes. Spleen: Normal in size without focal abnormality. Adrenals/Urinary Tract: Adrenal glands are unremarkable. Kidneys are normal, without renal calculi, focal lesion, or hydronephrosis. Bladder is unremarkable. Stomach/Bowel: Stomach is within normal limits. Appendix appears normal. No evidence of bowel wall thickening, distention, or inflammatory changes. Vascular/Lymphatic: No significant vascular findings are present. No enlarged abdominal or pelvic lymph nodes. Reproductive: Prostate is unremarkable. Other: No abdominal wall hernia or abnormality. No abdominopelvic ascites. No pneumoperitoneum. Musculoskeletal: No discrete abdominal wall mass. No acute or significant osseous findings. IMPRESSION: 1. No discrete abdominal wall mass. 2.  No acute intra-abdominal process. Electronically Signed   By: Titus Dubin  M.D.   On: 01/18/2018 16:43   Ct Knee Left Wo Contrast  Result Date: 01/17/2018 CLINICAL DATA:  Bicondylar tibial plateau fracture. EXAM: CT OF THE LEFT KNEE WITHOUT CONTRAST TECHNIQUE: Multidetector CT imaging of the left knee was  performed according to the standard protocol. Multiplanar CT image reconstructions were also generated. COMPARISON:  CT left knee from yesterday. FINDINGS: Bones/Joint/Cartilage Again seen is a highly comminuted bicondylar tibial plateau fracture extending into the proximal tibial metadiaphysis. Slightly improved apex posterior angulation of the metadiaphyseal fracture. New 8 mm anterior displacement and 12 mm medial displacement and angulation of the dominant anterior proximal tibial metadiaphyseal fragment. Unchanged 9 mm lateral tibial plateau depression. Unchanged highly comminuted fracture of the proximal fibular diaphysis with slight anterior displacement. Unchanged moderate lipohemarthrosis. Ligaments Suboptimally assessed by CT. Muscles and Tendons The extensor mechanism is intact.  No muscle atrophy. Soft tissues Prominent soft tissue edema along the anterior knee, unchanged. IMPRESSION: 1. Again seen is a highly comminuted bicondylar tibial plateau fracture extending into the proximal tibial metadiaphysis. New medial displacement and angulation of the dominant anterior proximal tibial metadiaphyseal fragment. 2. Unchanged highly comminuted fracture of the proximal fibular diaphysis. 3. Unchanged moderate lipohemarthrosis. Electronically Signed   By: Titus Dubin M.D.   On: 01/17/2018 11:44    Review of Systems  Constitutional: Negative for weight loss.  HENT: Negative for ear discharge, ear pain, hearing loss and tinnitus.   Eyes: Negative for blurred vision, double vision, photophobia and pain.  Respiratory: Negative for cough, sputum production and shortness of breath.   Cardiovascular: Negative for chest pain.  Gastrointestinal: Positive for abdominal pain. Negative for nausea and vomiting.  Genitourinary: Negative for dysuria, flank pain, frequency and urgency.  Musculoskeletal: Positive for falls and joint pain. Negative for back pain, myalgias and neck pain.  Neurological: Negative for  dizziness, tingling, sensory change, focal weakness, loss of consciousness and headaches.  Endo/Heme/Allergies: Does not bruise/bleed easily.  Psychiatric/Behavioral: Negative for depression, memory loss and substance abuse. The patient is not nervous/anxious.    Blood pressure 136/85, pulse (!) 110, temperature 98.2 F (36.8 C), temperature source Oral, resp. rate 18, height 5\' 8"  (1.727 m), weight (!) 186 kg (410 lb), SpO2 93 %. Physical Exam WDWN in NAD Eyes:  Pupils equal, round; sclera anicteric HENT:  Oral mucosa moist; good dentition  Neck:  Right lateral - 3 x 2 cm subcutaneous mass, no thyromegaly Right chest wall near axilla - 4 x 5 cm subcutaneous mass; no skin discoloration Lungs:  CTA bilaterally; normal respiratory effort CV:  Regular rate and rhythm; no murmurs; extremities well-perfused with no edema Abd:  +bowel sounds, soft, non-tender Massive right lower quadrant subcutaneous mass/ asymmetric adipose tissue; skin thickening on lower pannus; minimal tenderness Umbilicus - mild skin maceration on side of deep umbilicus Skin:  Warm, dry; no sign of jaundice Psychiatric - alert and oriented x 4; calm mood and affect  Assessment/Plan: Right chest wall lipoma Right abdominal wall soft tissue mass - probable massive lipoma with overlying skin changes Morbid obesity  Mobilization and rehab will be extremely difficult in this patient after his tibial plateau ORIF due to his size and the asymmetry of the weight load from his abdominal wall.  This may have contributed to the left leg and ankle pain, due to compensation of the asymmetric weight load.  It is unfortunate that this soft tissue mass has been neglected over the last five years because of financial reasons, but I think it would be  in the patient's best interest to remove the massive mass via panniculectomy.  We will also excise the right chest wall mass.  Because of the small size and the difficulty in positioning, we will  leave the small neck lipoma alone for now and will address that electively.  The patient does not smoke, drink, or have diabetes, so hopefully wound healing will not be an issue.  We will use a Prevena VAC.  Plan:  Excision of right chest wall mass/ excision of right abdominal wall mass/  Panniculectomy.  The surgical procedure has been discussed with the patient.  Potential risks, benefits, alternative treatments, and expected outcomes have been explained.  Risks include poor wound healing, bleeding, infection.  All of the patient's questions at this time have been answered.  The likelihood of reaching the patient's treatment goal is good.  The patient understand the proposed surgical procedure and wishes to proceed.   Tyler Walters 01/19/2018, 7:36 AM

## 2018-01-19 NOTE — Progress Notes (Signed)
PT Cancellation Note  Patient Details Name: Tyler Walters MRN: [id number] DOB: [birth year]   Cancelled Treatment:    Reason Eval/Treat Not Completed: Patient at procedure or test/unavailable Attempted to evaluate pt at 0900, pt is being transported from room for surgery.   Reinaldo Berber, PT, DPT Acute Rehab Services Pager: [phone]     Reinaldo Berber 01/19/2018, 8:52 AM

## 2018-01-19 NOTE — Transfer of Care (Signed)
Immediate Anesthesia Transfer of Care Note  Patient: Tyler Walters  Procedure(s) Performed: EXCISIONOF RIGHT CHEST WALL MASS, EXCISION OF ABDOMINAL MASS (Right Chest) PANNICULECTOMY (N/A Abdomen)  Patient Location: PACU  Anesthesia Type:General  Level of Consciousness: awake, alert  and oriented  Airway & Oxygen Therapy: Patient Spontanous Breathing and Patient connected to nasal cannula oxygen  Post-op Assessment: Report given to RN and Post -op Vital signs reviewed and stable  Post vital signs: Reviewed and stable  Last Vitals:  Vitals:   01/19/18 0427 01/19/18 1617  BP: 136/85 (!) (P) 157/89  Pulse: (!) 110 (!) (P) 104  Resp: 18 (P) 18  Temp: 36.8 C (!) (P) 36.2 C  SpO2: 93% (P) 95%    Last Pain:  Vitals:   01/19/18 1617  TempSrc:   PainSc: (P) 0-No pain      Patients Stated Pain Goal: 3 (56/97/94 8016)  Complications: No apparent anesthesia complications

## 2018-01-19 NOTE — Anesthesia Postprocedure Evaluation (Signed)
Anesthesia Post Note  Patient: [name]  Procedure(s) Performed: EXCISIONOF RIGHT CHEST WALL MASS, EXCISION OF ABDOMINAL MASS (Right Chest) PANNICULECTOMY (N/A Abdomen)     Patient location during evaluation: PACU Anesthesia Type: General Level of consciousness: awake and alert Pain management: pain level controlled Vital Signs Assessment: post-procedure vital signs reviewed and stable Respiratory status: spontaneous breathing, nonlabored ventilation, respiratory function stable and patient connected to nasal cannula oxygen Cardiovascular status: blood pressure returned to baseline and stable Postop Assessment: no apparent nausea or vomiting Anesthetic complications: no    Last Vitals:  Vitals:   01/19/18 0427 01/19/18 1617  BP: 136/85 (!) 157/89  Pulse: (!) 110 (!) 104  Resp: 18 18  Temp: 36.8 C (!) 36.2 C  SpO2: 93% 95%    Last Pain:  Vitals:   01/19/18 1617  TempSrc:   PainSc: 0-No pain                 Rosalita Carey

## 2018-01-19 NOTE — Progress Notes (Signed)
   01/19/18 0900  OT Visit Information  Last OT Received On 01/19/18  Reason Eval/Treat Not Completed Patient at procedure or test/ unavailable. Pt is in surgery.    Tyrone Schimke OTR/L Pager: [phone]

## 2018-01-20 ENCOUNTER — Encounter (HOSPITAL_COMMUNITY): Payer: Self-pay | Admitting: Surgery

## 2018-01-20 MED ORDER — ENOXAPARIN SODIUM 100 MG/ML ~~LOC~~ SOLN
95.0000 mg | SUBCUTANEOUS | Status: DC
  Administered 2018-01-20 – 2018-01-22 (×3): 95 mg via SUBCUTANEOUS
  Filled 2018-01-20 (×4): qty 0.95

## 2018-01-20 NOTE — Progress Notes (Signed)
Rehab Admissions Coordinator Note:  Patient was screened by Cleatrice Burke for appropriateness for an Inpatient Acute Rehab Consult per PT and OT recommendation  At this time, we are recommending Inpatient Rehab consult. Please place order for consult if pt would like to be considered for admit.  Cleatrice Burke 01/20/2018, 6:16 PM  I can be reached at [phone].

## 2018-01-20 NOTE — Progress Notes (Signed)
Orthopedic Tech Progress Note Patient Details:  [name] Aug 19, 1966 [id number]  Ortho Devices Type of Ortho Device: Ace wrap Ortho Device/Splint Location: Left leg Ortho Device/Splint Interventions: Application   Post Interventions Patient Tolerated: Well Instructions Provided: Care of device   [place] Pink 01/20/2018, 5:00 PM

## 2018-01-20 NOTE — Evaluation (Signed)
Physical Therapy Evaluation Patient Details Name: Tyler Walters MRN: [id number] DOB: 1966/08/26 Today's Date: 01/20/2018   History of Present Illness  Tyler Walters is a 52 y.o. male who sustained a left tibial plateau fracture s/p fall from step ladder and is currently in ex-fix awaiting surgical intervention this week. Pt also found to have growing abdominal mass that has been surgically removed 3/14.     Clinical Impression  Patient is s/p above surgery resulting in functional limitations due to the deficits listed below (see PT Problem List). PTA, pt independent with all mobility, working Architect, driving, and living with wife in 1 story home with 7 stairs to enter. Upon eval, pt presents with abdominal and LLE pain, obesity, and deconditioning from bed rest. Currently Max A x2 for bed mobility and able to stand at EOB for 3-5 minutes inside RW with Mod A x2 level for transfers. Unable this visit to move into bedside chair due to pain. Patient very motivated and eager to work with therapy today. Patient will benefit from skilled PT to increase their independence and safety with mobility to allow discharge to the venue listed below.       Follow Up Recommendations CIR;Supervision/Assistance - 24 hour    Equipment Recommendations  Rolling walker with 5" wheels;3in1 (PT)(Bari Sized)    Recommendations for Other Services Rehab consult     Precautions / Restrictions Precautions Precautions: Fall Restrictions Weight Bearing Restrictions: Yes LLE Weight Bearing: Non weight bearing      Mobility  Bed Mobility Overal bed mobility: Needs Assistance Bed Mobility: Supine to Sit;Sit to Supine     Supine to sit: Max assist;+2 for physical assistance Sit to supine: Max assist;+2 for physical assistance   General bed mobility comments: Max A x2 to help legs over EOB and support trunk due to pain from surgery.   Transfers Overall transfer level: Needs assistance Equipment used:  Rolling walker (2 wheeled) Transfers: Sit to/from Stand Sit to Stand: Mod assist;+2 physical assistance(+3)         General transfer comment: mod A x2 to sit to stand 2 times this session. Patient tolerating standing for 5 minutes before becoming dizzy and needing to sit back down. 3rd person assist to support LLE and maintain NWB status.    Ambulation/Gait             General Gait Details: unable  Stairs            Wheelchair Mobility    Modified Rankin (Stroke Patients Only)       Balance Overall balance assessment: Needs assistance Sitting-balance support: Feet unsupported;Bilateral upper extremity supported Sitting balance-Leahy Scale: Poor Sitting balance - Comments: requires min guard for balance due to pain                                     Pertinent Vitals/Pain Pain Assessment: 0-10 Pain Score: 5  Pain Location: LLE and stomach along incision  Pain Descriptors / Indicators: Aching;Contraction;Discomfort;Operative site guarding Pain Intervention(s): Limited activity within patient's tolerance;Monitored during session;Premedicated before session;Repositioned    Home Living Family/patient expects to be discharged to:: Private residence Living Arrangements: Spouse/significant other;Other relatives Available Help at Discharge: Family Type of Home: House Home Access: Stairs to enter Entrance Stairs-Rails: Can reach both Entrance Stairs-Number of Steps: 7 Home Layout: One level Home Equipment: None      Prior Function Level of Independence: Independent  Comments: ADLs, IADLs, working, driving. Decreased activity tolerance     Hand Dominance   Dominant Hand: Right    Extremity/Trunk Assessment   Upper Extremity Assessment Upper Extremity Assessment: Overall WFL for tasks assessed;Defer to OT evaluation    Lower Extremity Assessment Lower Extremity Assessment: (RLE strength WNL, LLE painful consistent with fracture.  )    Cervical / Trunk Assessment Cervical / Trunk Assessment: Other exceptions Cervical / Trunk Exceptions: Increased body habitus  Communication   Communication: No difficulties  Cognition Arousal/Alertness: Awake/alert Behavior During Therapy: WFL for tasks assessed/performed Overall Cognitive Status: Within Functional Limits for tasks assessed                                        General Comments      Exercises General Exercises - Lower Extremity Ankle Circles/Pumps: 20 reps Hip ABduction/ADduction: 10 reps Straight Leg Raises: 10 reps   Assessment/Plan    PT Assessment Patient needs continued PT services  PT Problem List Decreased strength;Decreased range of motion;Decreased balance;Decreased activity tolerance;Decreased mobility;Obesity;Pain       PT Treatment Interventions DME instruction;Gait training;Stair training;Functional mobility training;Therapeutic activities;Therapeutic exercise;Balance training    PT Goals (Current goals can be found in the Care Plan section)  Acute Rehab PT Goals Patient Stated Goal: walk again  PT Goal Formulation: With patient Time For Goal Achievement: 01/27/18 Potential to Achieve Goals: Good    Frequency Min 5X/week   Barriers to discharge        Co-evaluation PT/OT/SLP Co-Evaluation/Treatment: Yes Reason for Co-Treatment: Complexity of the patient's impairments (multi-system involvement);Necessary to address cognition/behavior during functional activity;For patient/therapist safety;To address functional/ADL transfers PT goals addressed during session: Mobility/safety with mobility;Balance;Proper use of DME;Strengthening/ROM         AM-PAC PT "6 Clicks" Daily Activity  Outcome Measure Difficulty turning over in bed (including adjusting bedclothes, sheets and blankets)?: Unable Difficulty moving from lying on back to sitting on the side of the bed? : Unable Difficulty sitting down on and standing up from  a chair with arms (e.g., wheelchair, bedside commode, etc,.)?: Unable Help needed moving to and from a bed to chair (including a wheelchair)?: A Lot Help needed walking in hospital room?: Total Help needed climbing 3-5 steps with a railing? : Total 6 Click Score: 7    End of Session Equipment Utilized During Treatment: Gait belt(Sheet used as gait belt) Activity Tolerance: Patient limited by fatigue Patient left: in bed;with call bell/phone within reach;with family/visitor present Nurse Communication: Mobility status PT Visit Diagnosis: Unsteadiness on feet (R26.81);Other abnormalities of gait and mobility (R26.89);Pain;History of falling (Z91.81) Pain - Right/Left: Left Pain - part of body: Leg    Time: 3295-1884 PT Time Calculation (min) (ACUTE ONLY): 47 min   Charges:   PT Evaluation $PT Eval Moderate Complexity: 1 Mod PT Treatments $Therapeutic Activity: 8-22 mins   PT G Codes:        Reinaldo Berber, PT, DPT Acute Rehab Services Pager: [phone]    Reinaldo Berber 01/20/2018, 3:55 PM

## 2018-01-20 NOTE — Progress Notes (Signed)
[facility]opaedic Trauma Service (OTS)  1 Day Post-Op Procedure(s) (LRB): EXCISIONOF RIGHT CHEST WALL MASS, EXCISION OF ABDOMINAL MASS (Right) PANNICULECTOMY (N/A)  Subjective: Patient reports pain as moderate.    Objective: Current Vitals Blood pressure (!) 135/97, pulse 85, temperature 98.7 F (37.1 C), temperature source Oral, resp. rate (!) 32, height 5\' 8"  (1.727 m), weight (!) 186 kg (410 lb), SpO2 98 %. Vital signs in last 24 hours: Temp:  [97.2 F (36.2 C)-98.8 F (37.1 C)] 98.7 F (37.1 C) (03/16 0439) Pulse Rate:  [82-104] 85 (03/16 0439) Resp:  [18-33] 32 (03/15 1700) BP: (133-162)/(78-98) 135/97 (03/16 0439) SpO2:  [93 %-99 %] 98 % (03/16 0439)  Intake/Output from previous day: 03/15 0701 - 03/16 0700 In: 3600 [I.V.:3100; IV Piggyback:500] Out: 2555 [Urine:2300; Drains:105; Blood:150]  LABS No results for input(s): HGB in the last 72 hours. No results for input(s): WBC, RBC, HCT, PLT in the last 72 hours. No results for input(s): NA, K, CL, CO2, BUN, CREATININE, GLUCOSE, CALCIUM in the last 72 hours. No results for input(s): LABPT, INR in the last 72 hours.   Physical Exam LLE Dressing changed  Edema/ swelling controlled  Sens: DPN, SPN, TN intact  Motor: EHL, FHL, and lessor toe ext and flex all intact grossly  Brisk cap refill, warm to touch  Assessment/Plan: 1 Day Post-Op Procedure(s) (LRB): EXCISIONOF RIGHT CHEST WALL MASS, EXCISION OF ABDOMINAL MASS (Right) PANNICULECTOMY (N/A) 1. PT/OT bed to chair as tolerated per abdominal wound, NWB on LLE 2. DVT proph Lovenox weight based now 3. OR this week for left tibial plateau  Altamese Ashley, MD [facility] [phone] (p)

## 2018-01-20 NOTE — Progress Notes (Signed)
Patient ID: Tyler Walters, male   DOB: 08-20-1966, 52 y.o.   MRN: [id number]    1 Day Post-Op  Subjective: Patient had some pain overnight, but mostly from malfunctioning foley catheter.  This morning he has no new complains.  Has to have a BM  Objective: Vital signs in last 24 hours: Temp:  [97.2 F (36.2 C)-98.8 F (37.1 C)] 98.7 F (37.1 C) (03/16 0439) Pulse Rate:  [82-104] 85 (03/16 0439) Resp:  [18-33] 32 (03/15 1700) BP: (133-162)/(78-98) 135/97 (03/16 0439) SpO2:  [93 %-99 %] 98 % (03/16 0439) Weight:  [410 lb (186 kg)] 410 lb (186 kg) (03/15 0917) Last BM Date: 01/15/18  Intake/Output from previous day: 03/15 0701 - 03/16 0700 In: 3600 [I.V.:3100; IV Piggyback:500] Out: 2555 [Urine:2300; Drains:105; Blood:150] Intake/Output this shift: No intake/output data recorded.  PE: Abd: morbidly obese, prevena VAC in place with good suction. 20cc of serosang output in VAC cannister.  Both JP drains with bloody, serosang output. Left with 40cc and right with 45cc  Lab Results:  No results for input(s): WBC, HGB, HCT, PLT in the last 72 hours. BMET No results for input(s): NA, K, CL, CO2, GLUCOSE, BUN, CREATININE, CALCIUM in the last 72 hours. PT/INR No results for input(s): LABPROT, INR in the last 72 hours. CMP     Component Value Date/Time   NA 141 01/16/2018 1813   K 3.9 01/16/2018 1813   CL 105 01/16/2018 1813   CO2 25 01/16/2018 1813   GLUCOSE 110 (H) 01/16/2018 1813   BUN 17 01/16/2018 1813   CREATININE 0.98 01/16/2018 1813   CALCIUM 8.7 (L) 01/16/2018 1813   PROT 7.9 12/23/2016 0818   ALBUMIN 3.7 12/23/2016 0818   AST 23 12/23/2016 0818   ALT 20 12/23/2016 0818   ALKPHOS 80 12/23/2016 0818   BILITOT 0.7 12/23/2016 0818   GFRNONAA >60 01/16/2018 1813   GFRAA >60 01/16/2018 1813   Lipase     Component Value Date/Time   LIPASE 19 12/23/2016 0818       Studies/Results: Ct Abdomen Pelvis Wo Contrast  Result Date: 01/18/2018 CLINICAL DATA:   Right-sided abdominal wall mass. EXAM: CT ABDOMEN AND PELVIS WITHOUT CONTRAST TECHNIQUE: Multidetector CT imaging of the abdomen and pelvis was performed following the standard protocol without IV contrast. COMPARISON:  CT abdomen and pelvis dated December 23, 2016. FINDINGS: Lower chest: Bibasilar atelectasis. Hepatobiliary: No focal liver abnormality is seen. No gallstones, gallbladder wall thickening, or biliary dilatation. Pancreas: Unremarkable. No pancreatic ductal dilatation or surrounding inflammatory changes. Spleen: Normal in size without focal abnormality. Adrenals/Urinary Tract: Adrenal glands are unremarkable. Kidneys are normal, without renal calculi, focal lesion, or hydronephrosis. Bladder is unremarkable. Stomach/Bowel: Stomach is within normal limits. Appendix appears normal. No evidence of bowel wall thickening, distention, or inflammatory changes. Vascular/Lymphatic: No significant vascular findings are present. No enlarged abdominal or pelvic lymph nodes. Reproductive: Prostate is unremarkable. Other: No abdominal wall hernia or abnormality. No abdominopelvic ascites. No pneumoperitoneum. Musculoskeletal: No discrete abdominal wall mass. No acute or significant osseous findings. IMPRESSION: 1. No discrete abdominal wall mass. 2.  No acute intra-abdominal process. Electronically Signed   By: Titus Dubin M.D.   On: 01/18/2018 16:43    Anti-infectives: Anti-infectives (From admission, onward)   Start     Dose/Rate Route Frequency Ordered Stop   01/19/18 1800  ceFAZolin (ANCEF) 3 g in dextrose 5 % 50 mL IVPB     3 g 160 mL/hr over 30 Minutes Intravenous To ShortStay Surgical 01/19/18  1720 01/20/18 1800   01/19/18 1200  ceFAZolin (ANCEF) 3 g in dextrose 5 % 50 mL IVPB     3 g 160 mL/hr over 30 Minutes Intravenous To ShortStay Surgical 01/19/18 1157 01/19/18 1202   01/17/18 0200  ceFAZolin (ANCEF) IVPB 2g/100 mL premix     2 g 200 mL/hr over 30 Minutes Intravenous Every 6 hours  01/16/18 2226 01/17/18 1430   01/16/18 1915  ceFAZolin (ANCEF) 3 g in dextrose 5 % 50 mL IVPB    Comments:  Anesthesia to give preop   3 g 130 mL/hr over 30 Minutes Intravenous  Once 01/16/18 1859 01/16/18 1922       Assessment/Plan Right chest wall lipoma - POD 1, s/p resection of lipoma. Right abdominal wall soft tissue mass - POD 1 s/p excision of right abdominal wall subcutaneous mass with panniculectomy.  Prevena VAC and JP drains in place.  Pain is controlled at this time.   Tibial plateau fx - per ortho, likely OR next week  FEN - regular diet VTE - Lovenox 40mg  daily ID - Ancef 3g preop Foley - in place due to prolonged surgical time and limited mobility.  Could likely be removed and plan for bedside urinal to be used.  Plan: cont Prevena VAC in place.  Sutures, staples, etc will be left in place for at least 2 weeks.  Will follow.   LOS: 4 days    Henreitta Cea , [facility] Surgery 01/20/2018, 8:19 AM Pager: [phone] Consults: [phone] Mon-Fri 7:00 am-4:30 pm Sat-Sun 7:00 am-11:30 am

## 2018-01-20 NOTE — Evaluation (Signed)
Occupational Therapy Evaluation Patient Details Name: Tyler Walters MRN: [id number] DOB: 12/08/1965 Today's Date: 01/20/2018    History of Present Illness Tyler Walters is a 52 y.o. male who sustained a left tibial plateau fracture s/p fall from step ladder and is currently in ex-fix awaiting surgical intervention this week. Pt also found to have growing abdominal mass that has been surgically removed 3/14.    Clinical Impression   PTA, pt was living with his wife and was independent and working in [employer]. Pt currently requiring Mod A for UB ADLs and Max A for LB ADLs. Pt requiring Mod A +3 for sit<>stand from EOB with third person support NWB status at LLE. Pt is highly motivated to participate in therapy and has good family support. Pt will required further acute OT to increase facilitate safe dc. Due to PLOF, diagnosis, age, support, and motivation, recommend dc to CIR for intensive OT to optimize safety and independence with ADLs and fucnitonal mobility as well as decrease caregiver burden.    Follow Up Recommendations  CIR    Equipment Recommendations  Other (comment);3 in 1 bedside commode;Tub/shower bench;Wheelchair (measurements OT);Wheelchair cushion (measurements OT)(Defer to next venue)    Recommendations for Other Services Rehab consult;PT consult     Precautions / Restrictions Precautions Precautions: Fall Restrictions Weight Bearing Restrictions: Yes LLE Weight Bearing: Non weight bearing      Mobility Bed Mobility Overal bed mobility: Needs Assistance Bed Mobility: Supine to Sit;Sit to Supine     Supine to sit: Max assist;+2 for physical assistance Sit to supine: Max assist;+2 for physical assistance   General bed mobility comments: Max A x2 to help legs over EOB and support trunk due to pain from surgery.   Transfers Overall transfer level: Needs assistance Equipment used: Rolling walker (2 wheeled) Transfers: Sit to/from Stand Sit to Stand: Mod  assist;+2 physical assistance(+3)         General transfer comment: mod A x2 to sit to stand 2 times this session. Patient tolerating standing for 5 minutes before becoming dizzy and needing to sit back down. 3rd person assist to support LLE and maintain NWB status.      Balance Overall balance assessment: Needs assistance Sitting-balance support: Feet unsupported;Bilateral upper extremity supported Sitting balance-Leahy Scale: Poor Sitting balance - Comments: requires min guard for balance due to pain   Standing balance support: Bilateral upper extremity supported;During functional activity Standing balance-Leahy Scale: Poor Standing balance comment: Requiring UE support and therapist assistance                           ADL either performed or assessed with clinical judgement   ADL Overall ADL's : Needs assistance/impaired Eating/Feeding: Set up;Bed level   Grooming: Set up;Bed level   Upper Body Bathing: Moderate assistance;Bed level   Lower Body Bathing: Total assistance;Bed level   Upper Body Dressing : Moderate assistance;Bed level   Lower Body Dressing: Total assistance;Bed level Lower Body Dressing Details (indicate cue type and reason): don socks     Toileting- Clothing Manipulation and Hygiene: Maximal assistance;+2 for physical assistance;Sit to/from stand Toileting - Clothing Manipulation Details (indicate cue type and reason): Max A for balance and to perform toilet hygiene     Functional mobility during ADLs: Moderate assistance;+2 for physical assistance;Rolling walker(Sit<>stand only) General ADL Comments: Pt with limited fucntional performance due to pain in LLE and abdomen     Vision  Perception     Praxis      Pertinent Vitals/Pain Pain Assessment: 0-10 Pain Score: 7 (5 before sitting at EOB) Pain Location: LLE and stomach along incision  Pain Descriptors / Indicators: Aching;Contraction;Discomfort;Operative site  guarding Pain Intervention(s): Monitored during session;Limited activity within patient's tolerance;Repositioned     Hand Dominance Right   Extremity/Trunk Assessment Upper Extremity Assessment Upper Extremity Assessment: Overall WFL for tasks assessed   Lower Extremity Assessment Lower Extremity Assessment: Defer to PT evaluation;LLE deficits/detail LLE Deficits / Details: LLE painful consistent with fracture. Able to wiggle toes LLE Coordination: decreased gross motor   Cervical / Trunk Assessment Cervical / Trunk Assessment: Other exceptions Cervical / Trunk Exceptions: Increased body habitus. Abdomen sx   Communication Communication Communication: No difficulties   Cognition Arousal/Alertness: Awake/alert Behavior During Therapy: WFL for tasks assessed/performed Overall Cognitive Status: Within Functional Limits for tasks assessed                                     General Comments       Exercises Exercises: General Lower Extremity General Exercises - Lower Extremity Ankle Circles/Pumps: 20 reps Hip ABduction/ADduction: 10 reps Straight Leg Raises: 10 reps   Shoulder Instructions      Home Living Family/patient expects to be discharged to:: Private residence Living Arrangements: Spouse/significant other;Other relatives Available Help at Discharge: Family Type of Home: House Home Access: Stairs to enter [employer] of Steps: 7 Entrance Stairs-Rails: Can reach both Home Layout: One level     Bathroom Shower/Tub: Walk-in shower;Tub/shower unit   Bathroom Toilet: Handicapped height     Home Equipment: None          Prior Functioning/Environment Level of Independence: Independent        Comments: ADLs, IADLs, working, driving. Decreased activity tolerance        OT Problem List: Decreased strength;Decreased range of motion;Decreased activity tolerance;Impaired balance (sitting and/or standing);Decreased safety  awareness;Decreased knowledge of use of DME or AE;Decreased knowledge of precautions;Pain      OT Treatment/Interventions: Self-care/ADL training;Therapeutic exercise;Energy conservation;DME and/or AE instruction;Therapeutic activities;Patient/family education    OT Goals(Current goals can be found in the care plan section) Acute Rehab OT Goals Patient Stated Goal: walk again  OT Goal Formulation: With patient Time For Goal Achievement: 02/03/18 Potential to Achieve Goals: Good ADL Goals Pt Will Perform Upper Body Dressing: with set-up;with supervision;sitting Pt Will Perform Lower Body Dressing: with set-up;with supervision;with adaptive equipment;sit to/from stand Pt Will Transfer to Toilet: with set-up;with supervision;bedside commode;stand pivot transfer Pt Will Perform Toileting - Clothing Manipulation and hygiene: with supervision;with set-up;sit to/from stand;with adaptive equipment Additional ADL Goal #1: Pt will perform bed mobility with supervision in preparation for ADLs in sitting  OT Frequency: Min 2X/week   Barriers to D/C:            Co-evaluation PT/OT/SLP Co-Evaluation/Treatment: Yes Reason for Co-Treatment: Complexity of the patient's impairments (multi-system involvement);To address functional/ADL transfers;For patient/therapist safety PT goals addressed during session: Mobility/safety with mobility;Balance;Proper use of DME;Strengthening/ROM OT goals addressed during session: ADL's and self-care      AM-PAC PT "6 Clicks" Daily Activity     Outcome Measure Help from another person eating meals?: None Help from another person taking care of personal grooming?: A Little Help from another person toileting, which includes using toliet, bedpan, or urinal?: A Lot Help from another person bathing (including washing, rinsing, drying)?: A Lot Help from  another person to put on and taking off regular upper body clothing?: A Lot Help from another person to put on and  taking off regular lower body clothing?: A Lot 6 Click Score: 15   End of Session Equipment Utilized During Treatment: Gait belt;Rolling walker Nurse Communication: Mobility status;Precautions;Weight bearing status  Activity Tolerance: Patient tolerated treatment well Patient left: in bed;with call bell/phone within reach;with family/visitor present  OT Visit Diagnosis: Unsteadiness on feet (R26.81);Other abnormalities of gait and mobility (R26.89);Muscle weakness (generalized) (M62.81);Pain Pain - Right/Left: Left Pain - part of body: Leg                Time: 0940-7680 OT Time Calculation (min): 46 min Charges:  OT General Charges $OT Visit: 1 Visit OT Evaluation $OT Eval Moderate Complexity: 1 Mod G-Codes:     Shanautica Forker MSOT, OTR/L Acute Rehab Pager: [phone] Office: [place] 01/20/2018, 6:03 PM

## 2018-01-20 NOTE — Progress Notes (Signed)
Pt is still complaining about the burning/ pain in his penis and bladder. MD Kinsinger was pages again.The order for additional Dilaudid 1mg  was given. RN will continue to monitor.

## 2018-01-21 LAB — BASIC METABOLIC PANEL
ANION GAP: 8 (ref 5–15)
BUN: 12 mg/dL (ref 6–20)
CO2: 26 mmol/L (ref 22–32)
Calcium: 8 mg/dL — ABNORMAL LOW (ref 8.9–10.3)
Chloride: 102 mmol/L (ref 101–111)
Creatinine, Ser: 0.93 mg/dL (ref 0.61–1.24)
GFR calc Af Amer: >60 mL/min (ref >60)
Glucose, Bld: 104 mg/dL — ABNORMAL HIGH (ref 65–99)
POTASSIUM: 3.9 mmol/L (ref 3.5–5.1)
SODIUM: 136 mmol/L (ref 135–145)

## 2018-01-21 LAB — CBC
HCT: 30.1 % — ABNORMAL LOW (ref 39.0–52.0)
Hemoglobin: 9.2 g/dL — ABNORMAL LOW (ref 13.0–17.0)
MCH: 24 pg — AB (ref 26.0–34.0)
MCHC: 30.6 g/dL (ref 30.0–36.0)
MCV: 78.6 fL (ref 78.0–100.0)
Platelets: 367 10*3/uL (ref 150–400)
RBC: 3.83 MIL/uL — AB (ref 4.22–5.81)
RDW: 16.2 % — ABNORMAL HIGH (ref 11.5–15.5)
WBC: 11 10*3/uL — AB (ref 4.0–10.5)

## 2018-01-21 NOTE — Progress Notes (Signed)
Patient ID: Tyler Walters, male   DOB: 1966/10/03, 52 y.o.   MRN: [id number]   Acute Care Surgery Service Progress Note:    Chief Complaint/Subjective: Feel a little tight in abd but no n/v Had BM Urinating better since foley out  Objective: Vital signs in last 24 hours: Temp:  [98.5 F (36.9 C)-99 F (37.2 C)] 98.7 F (37.1 C) (03/17 0521) Pulse Rate:  [96-103] 103 (03/17 0521) Resp:  [18] 18 (03/16 1500) BP: (146-175)/(82-102) 155/91 (03/17 0606) SpO2:  [96 %-97 %] 97 % (03/17 0521) Last BM Date: 01/20/18  Intake/Output from previous day: 03/16 0701 - 03/17 0700 In: -  Out: 1165 [Urine:900; Drains:265] Intake/Output this shift: Total I/O In: 120 [P.O.:120] Out: -   Lungs: cta, nonlabored  Cardiovascular: reg  Abd: severe obesity; prevena vac intact; drains serosang  Extremities: no edema, +SCDs; LLE ext fix  Neuro: alert, nonfocal  Lab Results: CBC  Recent Labs    01/21/18 0923  WBC 11.0*  HGB 9.2*  HCT 30.1*  PLT 367   BMET No results for input(s): NA, K, CL, CO2, GLUCOSE, BUN, CREATININE, CALCIUM in the last 72 hours. LFT Hepatic Function Latest Ref Rng & Units 12/23/2016 06/14/2009  Total Protein 6.5 - 8.1 g/dL 7.9 7.7  Albumin 3.5 - 5.0 g/dL 3.7 3.0(L)  AST 15 - 41 U/L 23 15  ALT 17 - 63 U/L 20 14  Alk Phosphatase 38 - 126 U/L 80 86  Total Bilirubin 0.3 - 1.2 mg/dL 0.7 0.7   PT/INR No results for input(s): LABPROT, INR in the last 72 hours. ABG No results for input(s): PHART, HCO3 in the last 72 hours.  Invalid input(s): PCO2, PO2  Studies/Results:  Anti-infectives: Anti-infectives (From admission, onward)   Start     Dose/Rate Route Frequency Ordered Stop   01/19/18 1800  ceFAZolin (ANCEF) 3 g in dextrose 5 % 50 mL IVPB     3 g 160 mL/hr over 30 Minutes Intravenous To ShortStay Surgical 01/19/18 1720 01/20/18 1800   01/19/18 1200  ceFAZolin (ANCEF) 3 g in dextrose 5 % 50 mL IVPB     3 g 160 mL/hr over 30 Minutes Intravenous To ShortStay  Surgical 01/19/18 1157 01/19/18 1202   01/17/18 0200  ceFAZolin (ANCEF) IVPB 2g/100 mL premix     2 g 200 mL/hr over 30 Minutes Intravenous Every 6 hours 01/16/18 2226 01/17/18 1430   01/16/18 1915  ceFAZolin (ANCEF) 3 g in dextrose 5 % 50 mL IVPB    Comments:  Anesthesia to give preop   3 g 130 mL/hr over 30 Minutes Intravenous  Once 01/16/18 1859 01/16/18 1922      Medications: Scheduled Meds: . docusate sodium  100 mg Oral BID  . enoxaparin (LOVENOX) injection  95 mg Subcutaneous Q24H  . mupirocin ointment  1 application Nasal BID  . tamsulosin  0.4 mg Oral QPC supper   Continuous Infusions: . sodium chloride 125 mL/hr at 01/19/18 0630  . methocarbamol (ROBAXIN)  IV     PRN Meds:.acetaminophen, diphenhydrAMINE, HYDROmorphone (DILAUDID) injection, magnesium citrate, methocarbamol **OR** methocarbamol (ROBAXIN)  IV, metoCLOPramide **OR** metoCLOPramide (REGLAN) injection, morphine injection, ondansetron **OR** ondansetron (ZOFRAN) IV, oxyCODONE, polyethylene glycol, sorbitol  Assessment/Plan: Patient Active Problem List   Diagnosis Date Noted  . Pannus, abdominal 01/19/2018  . Lipoma of abdominal wall   . Closed bicondylar fracture of left tibial plateau 01/16/2018  . MORBID OBESITY 06/25/2009  . CELLULITIS AND ABSCESS OF TRUNK 06/25/2009   Right chest wall  lipoma - POD 2 s/p resection of lipoma. Right abdominal wall soft tissue mass - POD 2 s/p excision of right abdominal wall subcutaneous mass with panniculectomy - Dr [facility] and JP drains in place.  Pain is controlled at this time.   Tibial plateau fx - per ortho, likely OR next week  FEN - regular diet VTE - Lovenox increased to 95 mg daily yesterday due to weight. Will reweigh pt today since had large mass/pannus excised Friday to confirm correct prophylactic dose ID - Ancef 3g preop  ABL anemia - hgb from 13 to 9 today; ?due to increased lovenox; repeat cbc in am  Plan: cont Prevena VAC in place.   Sutures, staples, etc will be left in place for at least 2 weeks.  Will follow.   Disposition:  LOS: 5 days    Leighton Ruff. Redmond Pulling, MD, FACS General, Bariatric, & Minimally Invasive Surgery [phone] [facility]

## 2018-01-21 NOTE — Progress Notes (Signed)
Orthopaedic Trauma Service (OTS)  2 Days Post-Op Procedure(s) (LRB): EXCISIONOF RIGHT CHEST WALL MASS, EXCISION OF ABDOMINAL MASS (Right) PANNICULECTOMY (N/A)  Subjective: Patient reports pain as improving but feels tight. Stood on edge of bed three times. Correct chair bing brought to the room to facilitate chair time today. Surgery antcipated Wed with Dr. Doreatha Martin as I will be out of town.    Objective: Current Vitals Blood pressure (!) 155/91, pulse (!) 103, temperature 98.7 F (37.1 C), temperature source Oral, resp. rate 18, height 5\' 8"  (1.727 m), weight (!) 186 kg (410 lb), SpO2 97 %. Vital signs in last 24 hours: Temp:  [98.5 F (36.9 C)-99 F (37.2 C)] 98.7 F (37.1 C) (03/17 0521) Pulse Rate:  [96-103] 103 (03/17 0521) Resp:  [18] 18 (03/16 1500) BP: (146-175)/(82-102) 155/91 (03/17 0606) SpO2:  [96 %-97 %] 97 % (03/17 0521)  Intake/Output from previous day: 03/16 0701 - 03/17 0700 In: -  Out: 1165 [Urine:900; Drains:265]  LABS Recent Labs    01/21/18 0923  HGB 9.2*   Recent Labs    01/21/18 0923  WBC 11.0*  RBC 3.83*  HCT 30.1*  PLT 367   Recent Labs    01/21/18 0923  NA 136  K 3.9  CL 102  CO2 26  BUN 12  CREATININE 0.93  GLUCOSE 104*  CALCIUM 8.0*   No results for input(s): LABPT, INR in the last 72 hours.   Physical Exam Abd drains still in and productive L>R LLE  Dressing intact, scant drainage  Edema/ swelling controlled; skin wrinkles  Sens: DPN, SPN, TN intact  Motor: EHL, FHL, and lessor toe ext and flex all intact grossly  Brisk cap refill, warm to touch  Assessment/Plan: 2 Days Post-Op Procedure(s) (LRB): EXCISIONOF RIGHT CHEST WALL MASS, EXCISION OF ABDOMINAL MASS (Right) PANNICULECTOMY (N/A) 1. PT/OT bed to chair WBAT on the RLE 2. DVT proph Lovenox weight based with another weigh in scheduled post excision. 3. Surgery for left tibial plateau on Wed.  Altamese Edison, MD [facility]  PC [phone] (p)

## 2018-01-21 NOTE — Plan of Care (Signed)
  Elimination: Will not experience complications related to bowel motility 01/21/2018 0955 - Progressing by Williams Che, RN   Pain Managment: General experience of comfort will improve 01/21/2018 0955 - Progressing by Williams Che, RN   Safety: Ability to remain free from injury will improve 01/21/2018 0955 - Progressing by Williams Che, RN   Skin Integrity: Risk for impaired skin integrity will decrease 01/21/2018 0955 - Progressing by Williams Che, RN

## 2018-01-22 LAB — CBC
HCT: 30.8 % — ABNORMAL LOW (ref 39.0–52.0)
HEMOGLOBIN: 9.6 g/dL — AB (ref 13.0–17.0)
MCH: 24.7 pg — AB (ref 26.0–34.0)
MCHC: 31.2 g/dL (ref 30.0–36.0)
MCV: 79.2 fL (ref 78.0–100.0)
PLATELETS: 403 10*3/uL — AB (ref 150–400)
RBC: 3.89 MIL/uL — AB (ref 4.22–5.81)
RDW: 16.7 % — ABNORMAL HIGH (ref 11.5–15.5)
WBC: 10.5 10*3/uL (ref 4.0–10.5)

## 2018-01-22 NOTE — Progress Notes (Signed)
Occupational Therapy Treatment Patient Details Name: Tyler Walters MRN: [id number] DOB: [birth year] Today's Date: 01/22/2018    History of present illness Tyler Walters is a 52 y.o. male who sustained a left tibial plateau fracture s/p fall from step ladder and is currently in ex-fix awaiting surgical intervention this week. Pt also found to have growing abdominal mass that has been surgically removed 3/14.    OT comments  This 52 yo male admitted with and underwent above (with follow up surgery planned this coming Wednesday to remove external fixator) presents to acute OT today making progress with overall mobility since eval. Increased bed mobility and able to do a stand pivot transfer today. He will continue to benefit from acute OT with follow up OT on CIR.   Follow Up Recommendations  CIR    Equipment Recommendations  Other (comment);3 in 1 bedside commode;Tub/shower bench;Wheelchair (measurements OT);Wheelchair cushion (measurements OT)       Precautions / Restrictions Precautions Precautions: Fall Restrictions Weight Bearing Restrictions: Yes LLE Weight Bearing: Non weight bearing       Mobility Bed Mobility Overal bed mobility: Needs Assistance Bed Mobility: Supine to Sit;Sit to Supine     Supine to sit: Mod assist;+2 for physical assistance     General bed mobility comments: Mod A x2 to assist LLE over bed, patient with improved trunk control and ability to rise to sitting today due to less pain in abdomen  Transfers Overall transfer level: Needs assistance Equipment used: Rolling walker (2 wheeled) Transfers: Sit to/from [facility] Sit to Stand: Min assist;+2 physical assistance(+3 for managing NWB status and ex-fix) Stand pivot transfers: Mod assist;+2 physical assistance;+2 safety/equipment       General transfer comment: min A x2 to power up into standing, mod A x 2 for safe pivot transfer into bedside chair. cues for hand placement. pt able  to toelrate standing with min guard    Balance Overall balance assessment: Needs assistance Sitting-balance support: Feet unsupported;Bilateral upper extremity supported Sitting balance-Leahy Scale: Poor Sitting balance - Comments: requires min guard for balance due to pain   Standing balance support: Bilateral upper extremity supported;During functional activity Standing balance-Leahy Scale: Poor Standing balance comment: Requiring UE support and therapist assistance                           ADL either performed or assessed with clinical judgement   ADL Overall ADL's : Needs assistance/impaired                           Toilet Transfer Details (indicate cue type and reason): Mod A +3 for simulated transfer to 3n1 (bed>recliner at bedside); 2 at top and one for LLE to maintain [place]                 Vision Patient Visual Report: No change from baseline            Cognition Arousal/Alertness: Awake/alert Behavior During Therapy: WFL for tasks assessed/performed Overall Cognitive Status: Within Functional Limits for tasks assessed                                          Exercises General Exercises - Lower Extremity Ankle Circles/Pumps: 20 reps           Pertinent Vitals/ Pain  Pain Assessment: Faces Faces Pain Scale: Hurts even more Pain Location: LLE and stomach along incision  Pain Descriptors / Indicators: Aching;Contraction;Discomfort;Operative site guarding Pain Intervention(s): Limited activity within patient's tolerance;Premedicated before session;Monitored during session;Repositioned         Frequency  Min 2X/week        Progress Toward Goals  OT Goals(current goals can now be found in the care plan section)  Progress towards OT goals: Progressing toward goals  Acute Rehab OT Goals Patient Stated Goal: walk again   Plan Discharge plan remains appropriate    Co-evaluation    PT/OT/SLP  Co-Evaluation/Treatment: Yes Reason for Co-Treatment: Complexity of the patient's impairments (multi-system involvement);For patient/therapist safety;To address functional/ADL transfers PT goals addressed during session: Mobility/safety with mobility;Balance;Proper use of DME;Strengthening/ROM OT goals addressed during session: ADL's and self-care;Strengthening/ROM      AM-PAC PT "6 Clicks" Daily Activity     Outcome Measure   Help from another person eating meals?: None Help from another person taking care of personal grooming?: A Little Help from another person toileting, which includes using toliet, bedpan, or urinal?: A Little Help from another person bathing (including washing, rinsing, drying)?: A Little Help from another person to put on and taking off regular upper body clothing?: A Little Help from another person to put on and taking off regular lower body clothing?: Total 6 Click Score: 17    End of Session Equipment Utilized During Treatment: Gait belt;Rolling walker  OT Visit Diagnosis: Unsteadiness on feet (R26.81);Other abnormalities of gait and mobility (R26.89);Muscle weakness (generalized) (M62.81);Pain Pain - Right/Left: Left Pain - part of body: Leg   Activity Tolerance Patient tolerated treatment well   Patient Left in chair;with call bell/phone within reach;with family/visitor present   Nurse Communication Mobility status;Precautions;Weight bearing status(NT assisted [place] with tranfers (monitoring/holding LLE))        Time: 1017-1040 OT Time Calculation (min): 23 min  Charges: OT General Charges $OT Visit: 1 Visit OT Treatments $Self Care/Home Management : 8-22 mins  [place], OTR/L [phone] 01/22/2018

## 2018-01-22 NOTE — Progress Notes (Signed)
Orthopedic Trauma Service Progress Note   Patient ID: Tyler Walters MRN: [id number] DOB/AGE: 53-Aug-1967 53 y.o.  Subjective:  Doing well Pain tolerable in L leg abd less sore Mobilized to chair today and was much easier   Has been in chair for about 45 min  No other major complaints    ROS As above  Objective:   VITALS:   Vitals:   01/21/18 1755 01/21/18 1900 01/22/18 0425 01/22/18 0639  BP:  (!) 178/98  (!) 162/93  Pulse:  (!) 101  96  Resp:  18  17  Temp:  98.8 F (37.1 C)  98.4 F (36.9 C)  TempSrc:  Oral  Oral  SpO2:  96%  98%  Weight: (!) 152.4 kg (336 lb)  (!) 147.9 kg (326 lb)   Height: 5\' 8"  (1.727 m)       Estimated body mass index is 49.57 kg/m as calculated from the following:   Height as of this encounter: 5\' 8"  (1.727 m).   Weight as of this encounter: 147.9 kg (326 lb).   Intake/Output      03/17 0701 - 03/18 0700 03/18 0701 - 03/19 0700   P.O. 760 120   Total Intake(mL/kg) 760 (5.1) 120 (0.8)   Urine (mL/kg/hr) 1460 (0.4)    Drains 180    Stool     Total Output 1640    Net -880 +120          LABS  Results for orders placed or performed during the hospital encounter of 01/16/18 (from the past 24 hour(s))  CBC     Status: Abnormal   Collection Time: 01/22/18  5:25 AM  Result Value Ref Range   WBC 10.5 4.0 - 10.5 K/uL   RBC 3.89 (L) 4.22 - 5.81 MIL/uL   Hemoglobin 9.6 (L) 13.0 - 17.0 g/dL   HCT 30.8 (L) 39.0 - 52.0 %   MCV 79.2 78.0 - 100.0 fL   MCH 24.7 (L) 26.0 - 34.0 pg   MCHC 31.2 30.0 - 36.0 g/dL   RDW 16.7 (H) 11.5 - 15.5 %   Platelets 403 (H) 150 - 400 K/uL     PHYSICAL EXAM:   Gen: awake and alert, sitting up in chair, NAD Lungs: breathing unlabored Cardiac: pulse regular L leg Abd: drains present  Ext:       Left Lower Extremity   Ex fix stable  Bulky compressive dressing intact  Swelling improved  Ext warm   Motor and sensory functions intact    Assessment/Plan: 3 Days Post-Op    Principal Problem:   Closed bicondylar fracture of left tibial plateau Active Problems:   Pannus, abdominal   Anti-infectives (From admission, onward)   Start     Dose/Rate Route Frequency Ordered Stop   01/19/18 1800  ceFAZolin (ANCEF) 3 g in dextrose 5 % 50 mL IVPB     3 g 160 mL/hr over 30 Minutes Intravenous To ShortStay Surgical 01/19/18 1720 01/20/18 1800   01/19/18 1200  ceFAZolin (ANCEF) 3 g in dextrose 5 % 50 mL IVPB     3 g 160 mL/hr over 30 Minutes Intravenous To ShortStay Surgical 01/19/18 1157 01/19/18 1202   01/17/18 0200  ceFAZolin (ANCEF) IVPB 2g/100 mL premix     2 g 200 mL/hr over 30 Minutes Intravenous Every 6 hours 01/16/18 2226 01/17/18 1430   01/16/18 1915  ceFAZolin (ANCEF) 3 g in dextrose 5 % 50 mL IVPB    Comments:  Anesthesia to give  preop   3 g 130 mL/hr over 30 Minutes Intravenous  Once 01/16/18 1859 01/16/18 1922    .  POD/HD#: 34  52 y/o male s/p fall off ladder with closed L bicondylar tibial plateau fracture    - closed L bicondylar tibial plateau fracture s/p Ex fix              OR weds with Dr. Doreatha Martin              NWB L leg              Ice and elevate L leg             Move toes and ankle    - massive abdominal pannus, R chest wall lipoma s/p panniculectomy and excision R chest wall lipoma/mass             per general surgery    - Pain management:             Continue with current regimen    - ABL anemia/Hemodynamics             Stable    - DVT/PE prophylaxis:             lovenox - weight based given obesity   Hold tomorrow evening     - Activity:             NWB L leg    - FEN/Foley/Lines:             reg diet     -Ex-fix/Splint care:             Ok to manipulate leg by ex fix              Pin care as needed    - Dispo:          OR midweek for ORIF L tibial plateau   Will order CIR eval after all surgeries have been completed     Jari Pigg, PA-C [facility] [phone] (P) [phone]  (O) [phone] (C) 01/22/2018, 11:08 AM

## 2018-01-22 NOTE — Progress Notes (Signed)
[facility] Progress Note  3 Days Post-Op  Subjective: CC: R sided swelling Patient reports R drain has not been putting out as much and he feels like that side is a little more swollen than the left. R drainage also more bloody than L drain with some clots in drain tubing. Abdominal pain well controlled. Tolerating diet, passing flatus.  UOP good. Mild tachycardia  Objective: Vital signs in last 24 hours: Temp:  [98.4 F (36.9 C)-98.8 F (37.1 C)] 98.4 F (36.9 C) (03/18 0639) Pulse Rate:  [96-109] 96 (03/18 0639) Resp:  [17-18] 17 (03/18 0639) BP: (158-178)/(93-98) 162/93 (03/18 0639) SpO2:  [96 %-98 %] 98 % (03/18 0639) Weight:  [147.9 kg (326 lb)-152.4 kg (336 lb)] 147.9 kg (326 lb) (03/18 0425) Last BM Date: 01/20/18  Intake/Output from previous day: 03/17 0701 - 03/18 0700 In: 760 [P.O.:760] Out: 1640 [Urine:1460; Drains:180] Intake/Output this shift: No intake/output data recorded.  PE: Gen:  Alert, NAD, pleasant Card:  Regular rate and rhythm, pedal pulses 2+ BL Pulm:  Normal effort, clear to auscultation bilaterally R shoulder: incision c/d/i, no surrounding erythema or swelling Abd: Soft, non-tender, non-distended, bowel sounds present, no HSM, incision with provena VAC in place with sanguinous drainage in VAC cannister. L drain with moderate serosanguinous drainage. R drain with minimal sanguinous drainage and clot material in bulb and tubing. No surrounding erythema or discrete mass felt.  Ext: ex-fix present on LLE Skin: warm and dry, no rashes  Psych: A&Ox3   Lab Results:  Recent Labs    01/21/18 0923 01/22/18 0525  WBC 11.0* 10.5  HGB 9.2* 9.6*  HCT 30.1* 30.8*  PLT 367 403*   BMET Recent Labs    01/21/18 0923  NA 136  K 3.9  CL 102  CO2 26  GLUCOSE 104*  BUN 12  CREATININE 0.93  CALCIUM 8.0*   PT/INR No results for input(s): LABPROT, INR in the last 72 hours. CMP     Component Value Date/Time   NA 136 01/21/2018 0923   K  3.9 01/21/2018 0923   CL 102 01/21/2018 0923   CO2 26 01/21/2018 0923   GLUCOSE 104 (H) 01/21/2018 0923   BUN 12 01/21/2018 0923   CREATININE 0.93 01/21/2018 0923   CALCIUM 8.0 (L) 01/21/2018 0923   PROT 7.9 12/23/2016 0818   ALBUMIN 3.7 12/23/2016 0818   AST 23 12/23/2016 0818   ALT 20 12/23/2016 0818   ALKPHOS 80 12/23/2016 0818   BILITOT 0.7 12/23/2016 0818   GFRNONAA >60 01/21/2018 0923   GFRAA >60 01/21/2018 0923   Lipase     Component Value Date/Time   LIPASE 19 12/23/2016 0818       Studies/Results: No results found.  Anti-infectives: Anti-infectives (From admission, onward)   Start     Dose/Rate Route Frequency Ordered Stop   01/19/18 1800  ceFAZolin (ANCEF) 3 g in dextrose 5 % 50 mL IVPB     3 g 160 mL/hr over 30 Minutes Intravenous To ShortStay Surgical 01/19/18 1720 01/20/18 1800   01/19/18 1200  ceFAZolin (ANCEF) 3 g in dextrose 5 % 50 mL IVPB     3 g 160 mL/hr over 30 Minutes Intravenous To ShortStay Surgical 01/19/18 1157 01/19/18 1202   01/17/18 0200  ceFAZolin (ANCEF) IVPB 2g/100 mL premix     2 g 200 mL/hr over 30 Minutes Intravenous Every 6 hours 01/16/18 2226 01/17/18 1430   01/16/18 1915  ceFAZolin (ANCEF) 3 g in dextrose 5 % 50 mL  IVPB    Comments:  Anesthesia to give preop   3 g 130 mL/hr over 30 Minutes Intravenous  Once 01/16/18 1859 01/16/18 1922       Assessment/Plan Tibial plateau fracture - per ortho, OR possibly Wednesday  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 Georgette Dover - POD#3 - incision c/d/i, surgical dressing removed - patient may cleanse gently with soap and warm water R abdominal wall soft tissue mass - s/p panniculectomy 01/19/18 Dr. Georgette Dover - POD#3 - provena VAC and JP drains - L drain with 105 cc out; R drain with 25 cc out - try to flush R JP tubing and continue to monitor - sutures/staples to remain at least 2 weeks ABL anemia - hgb 9.6, mildly tachycardic - continue to monitor  FEN: reg diet VTE: SCD, lovenox  95 mg Q24h ID: Ancef 3/12, 3/13, 3/15-16;   LOS: 6 days    Brigid Re , [facility] 01/22/2018, 8:11 AM Pager: [phone] Consults: [phone] Mon-Fri 7:00 am-4:30 pm Sat-Sun 7:00 am-11:30 am

## 2018-01-22 NOTE — Progress Notes (Addendum)
Pt is concerned about the mass on his right abdomen. He states that he feels like it is expanding in size. There are blood clots in the tubing, although there is a small amount of drainage in the bulb. There is a bulge in the lower right abdomen, but the skin is soft.

## 2018-01-22 NOTE — Progress Notes (Signed)
Physical Therapy Treatment Patient Details Name: Tyler Walters MRN: [id number] DOB: January 14, 1966 Today's Date: 01/22/2018    History of Present Illness Tyler Walters is a 52 y.o. male who sustained a left tibial plateau fracture s/p fall from step ladder and is currently in ex-fix awaiting surgical intervention this week. Pt also found to have growing abdominal mass that has been surgically removed 3/14.     PT Comments    Patient progressing with therapy this visit, requiring less assistance with bed mobility and able to perform stand pivot transfer into bedside chair today. Session focused on improving fluidity and safety with sit<>standing, pt demonstrating improvement, now min A x2.     Follow Up Recommendations  CIR;Supervision/Assistance - 24 hour     Equipment Recommendations  Rolling walker with 5" wheels;3in1 (PT)    Recommendations for Other Services Rehab consult     Precautions / Restrictions Precautions Precautions: Fall Restrictions Weight Bearing Restrictions: Yes LLE Weight Bearing: Non weight bearing    Mobility  Bed Mobility Overal bed mobility: Needs Assistance Bed Mobility: Supine to Sit;Sit to Supine     Supine to sit: Mod assist;+2 for physical assistance     General bed mobility comments: Mod A x2 to assist LLE over bed, patient with improved trunk control and ability to rise to sitting today due to less pain in abdomen  Transfers Overall transfer level: Needs assistance Equipment used: Rolling walker (2 wheeled) Transfers: Sit to/from [facility] Sit to Stand: Min assist;+2 physical assistance(+3 for managing NWB status and ex-fix) Stand pivot transfers: Mod assist;+2 physical assistance;+2 safety/equipment       General transfer comment: min A x2 to power up into standing, mod A x 2 for safe pivot transfer into bedside chair. cues for hand placement. pt able to toelrate standing with min guard  Ambulation/Gait              General Gait Details: unable this visit   Stairs            Wheelchair Mobility    Modified Rankin (Stroke Patients Only)       Balance Overall balance assessment: Needs assistance Sitting-balance support: Feet unsupported;Bilateral upper extremity supported Sitting balance-Leahy Scale: Poor Sitting balance - Comments: requires min guard for balance due to pain   Standing balance support: Bilateral upper extremity supported;During functional activity Standing balance-Leahy Scale: Poor Standing balance comment: Requiring UE support and therapist assistance                            Cognition Arousal/Alertness: Awake/alert Behavior During Therapy: WFL for tasks assessed/performed Overall Cognitive Status: Within Functional Limits for tasks assessed                                        Exercises General Exercises - Lower Extremity Ankle Circles/Pumps: 20 reps    General Comments        Pertinent Vitals/Pain Pain Assessment: Faces Faces Pain Scale: Hurts even more Pain Location: LLE and stomach along incision  Pain Descriptors / Indicators: Aching;Contraction;Discomfort;Operative site guarding Pain Intervention(s): Limited activity within patient's tolerance;Premedicated before session;Monitored during session;Repositioned    Home Living                      Prior Function  PT Goals (current goals can now be found in the care plan section) Acute Rehab PT Goals Patient Stated Goal: walk again  PT Goal Formulation: With patient Time For Goal Achievement: 01/27/18 Potential to Achieve Goals: Good Progress towards PT goals: Progressing toward goals    Frequency    Min 5X/week      PT Plan Current plan remains appropriate    Co-evaluation PT/OT/SLP Co-Evaluation/Treatment: Yes Reason for Co-Treatment: Complexity of the patient's impairments (multi-system involvement);To address functional/ADL  transfers;Necessary to address cognition/behavior during functional activity;For patient/therapist safety PT goals addressed during session: Mobility/safety with mobility;Balance;Proper use of DME;Strengthening/ROM        AM-PAC PT "6 Clicks" Daily Activity  Outcome Measure  Difficulty turning over in bed (including adjusting bedclothes, sheets and blankets)?: Unable Difficulty moving from lying on back to sitting on the side of the bed? : Unable Difficulty sitting down on and standing up from a chair with arms (e.g., wheelchair, bedside commode, etc,.)?: Unable Help needed moving to and from a bed to chair (including a wheelchair)?: A Lot Help needed walking in hospital room?: Total Help needed climbing 3-5 steps with a railing? : Total 6 Click Score: 7    End of Session Equipment Utilized During Treatment: Gait belt Activity Tolerance: Patient limited by fatigue Patient left: in bed;with call bell/phone within reach;with family/visitor present Nurse Communication: Mobility status PT Visit Diagnosis: Unsteadiness on feet (R26.81);Other abnormalities of gait and mobility (R26.89);Pain;History of falling (Z91.81) Pain - Right/Left: Left Pain - part of body: Leg     Time: 6270-3500 PT Time Calculation (min) (ACUTE ONLY): 28 min  Charges:  $Therapeutic Activity: 8-22 mins                    G Codes:       Reinaldo Berber, PT, DPT Acute Rehab Services Pager: [phone]     Reinaldo Berber 01/22/2018, 11:13 AM

## 2018-01-23 LAB — CBC
HEMATOCRIT: 32.2 % — AB (ref 39.0–52.0)
HEMOGLOBIN: 9.9 g/dL — AB (ref 13.0–17.0)
MCH: 24.3 pg — AB (ref 26.0–34.0)
MCHC: 30.7 g/dL (ref 30.0–36.0)
MCV: 79.1 fL (ref 78.0–100.0)
Platelets: 403 10*3/uL — ABNORMAL HIGH (ref 150–400)
RBC: 4.07 MIL/uL — ABNORMAL LOW (ref 4.22–5.81)
RDW: 16.4 % — ABNORMAL HIGH (ref 11.5–15.5)
WBC: 11 10*3/uL — ABNORMAL HIGH (ref 4.0–10.5)

## 2018-01-23 LAB — CREATININE, SERUM
Creatinine, Ser: 0.91 mg/dL (ref 0.61–1.24)
GFR calc Af Amer: >60 mL/min (ref >60)

## 2018-01-23 MED ORDER — ENOXAPARIN SODIUM 80 MG/0.8ML ~~LOC~~ SOLN
70.0000 mg | SUBCUTANEOUS | Status: DC
  Administered 2018-01-25 – 2018-01-26 (×2): 70 mg via SUBCUTANEOUS
  Filled 2018-01-23 (×2): qty 0.8

## 2018-01-23 MED ORDER — CEFAZOLIN SODIUM 10 G IJ SOLR
3.0000 g | Freq: Once | INTRAMUSCULAR | Status: AC
  Administered 2018-01-24: 2 g via INTRAVENOUS
  Administered 2018-01-24: 3 g via INTRAVENOUS
  Filled 2018-01-23: qty 3
  Filled 2018-01-23: qty 3000

## 2018-01-23 MED ORDER — METHOCARBAMOL 1000 MG/10ML IJ SOLN
750.0000 mg | Freq: Four times a day (QID) | INTRAMUSCULAR | Status: DC | PRN
  Filled 2018-01-23: qty 7.5

## 2018-01-23 MED ORDER — METHOCARBAMOL 750 MG PO TABS
750.0000 mg | ORAL_TABLET | Freq: Four times a day (QID) | ORAL | Status: DC | PRN
  Administered 2018-01-23 – 2018-01-26 (×7): 750 mg via ORAL
  Filled 2018-01-23 (×7): qty 1

## 2018-01-23 NOTE — Progress Notes (Signed)
Message sent to pharmacy regarding morphine removed from chart due to allergy.

## 2018-01-23 NOTE — Progress Notes (Signed)
Orthopedic Trauma Service Progress Note   Patient ID: Tyler Walters MRN: [id number] DOB/AGE: [birth year] 52 y.o.  Subjective:  Doing ok  Ready for surgery tomorrow  No new issues Mild abd cramping but nothing too severe    ROS As above   Objective:   VITALS:   Vitals:   01/22/18 0639 01/22/18 1300 01/22/18 1957 01/23/18 0505  BP: (!) 162/93 (!) 163/101 (!) 158/83 (!) 152/98  Pulse: 96 (!) 118 (!) 114 100  Resp: 17 16 17 19   Temp: 98.4 F (36.9 C) 99.3 F (37.4 C) 99.3 F (37.4 C) 98.8 F (37.1 C)  TempSrc: Oral Oral Oral Oral  SpO2: 98% 99% 99% 97%  Weight:      Height:        Estimated body mass index is 49.57 kg/m as calculated from the following:   Height as of this encounter: 5\' 8"  (1.727 m).   Weight as of this encounter: 147.9 kg (326 lb).   Intake/Output      03/18 0701 - 03/19 0700 03/19 0701 - 03/20 0700   P.O. 600 240   Other 10    Total Intake(mL/kg) 610 (4.1) 240 (1.6)   Urine (mL/kg/hr) 625 (0.2)    Drains 88    Total Output 713    Net -103 +240        Urine Occurrence 3 x      LABS  Results for orders placed or performed during the hospital encounter of 01/16/18 (from the past 24 hour(s))  Creatinine, serum     Status: None   Collection Time: 01/23/18  6:35 AM  Result Value Ref Range   Creatinine, Ser 0.91 0.61 - 1.24 mg/dL   GFR calc non Af Amer >60 >60 mL/min   GFR calc Af Amer >60 >60 mL/min  CBC     Status: Abnormal   Collection Time: 01/23/18  6:35 AM  Result Value Ref Range   WBC 11.0 (H) 4.0 - 10.5 K/uL   RBC 4.07 (L) 4.22 - 5.81 MIL/uL   Hemoglobin 9.9 (L) 13.0 - 17.0 g/dL   HCT 32.2 (L) 39.0 - 52.0 %   MCV 79.1 78.0 - 100.0 fL   MCH 24.3 (L) 26.0 - 34.0 pg   MCHC 30.7 30.0 - 36.0 g/dL   RDW 16.4 (H) 11.5 - 15.5 %   Platelets 403 (H) 150 - 400 K/uL     PHYSICAL EXAM:   Gen: in bed, NAD, appears well  Ext:       Left Lower Extremity   Ex fix stable  Ext warm   Motor and sensory functions  intact  Mild pin drainage    + DP pulse   Assessment/Plan: 4 Days Post-Op   Principal Problem:   Closed bicondylar fracture of left tibial plateau Active Problems:   Pannus, abdominal   Anti-infectives (From admission, onward)   Start     Dose/Rate Route Frequency Ordered Stop   01/19/18 1800  ceFAZolin (ANCEF) 3 g in dextrose 5 % 50 mL IVPB     3 g 160 mL/hr over 30 Minutes Intravenous To ShortStay Surgical 01/19/18 1720 01/20/18 1800   01/19/18 1200  ceFAZolin (ANCEF) 3 g in dextrose 5 % 50 mL IVPB     3 g 160 mL/hr over 30 Minutes Intravenous To ShortStay Surgical 01/19/18 1157 01/19/18 1202   01/17/18 0200  ceFAZolin (ANCEF) IVPB 2g/100 mL premix     2 g 200 mL/hr over 30 Minutes Intravenous Every 6  hours 01/16/18 2226 01/17/18 1430   01/16/18 1915  ceFAZolin (ANCEF) 3 g in dextrose 5 % 50 mL IVPB    Comments:  Anesthesia to give preop   3 g 130 mL/hr over 30 Minutes Intravenous  Once 01/16/18 1859 01/16/18 1922    .  POD/HD#: 62  52 y/o male s/p fall off ladder with closed L bicondylar tibial plateau fracture    - closed L bicondylar tibial plateau fracture s/p Ex fix              OR tomorrow with Dr. Doreatha Martin              NWB L leg              Ice and elevate L leg             Move toes and ankle    - massive abdominal pannus, R chest wall lipoma s/p panniculectomy and excision R chest wall lipoma/mass             per general surgery    - Pain management:             Continue with current regimen    - ABL anemia/Hemodynamics             Stable    - DVT/PE prophylaxis:             lovenox - weight based given obesity              Hold this evening   - Activity:             NWB L leg    - FEN/Foley/Lines:             reg diet    NPO after MN    -Ex-fix/Splint care:             Ok to manipulate leg by ex fix              Pin care as needed    - Dispo:          OR tomorrow for Left tibial plateau    Jari Pigg, PA-C [facility] [phone] (P) [phone] [name] (C) 01/23/2018, 9:16 AM

## 2018-01-23 NOTE — Progress Notes (Signed)
[facility] Progress Note  4 Days Post-Op  Subjective: CC: abdominal cramping Patient denies abdominal pain but does report he feels some cramping at times. Notices some asymmetry in R/L sides of incision. Tolerating diet, passing flatus. OR for LLE planned for tomorrow.  Objective: Vital signs in last 24 hours: Temp:  [98.8 F (37.1 C)-99.3 F (37.4 C)] 98.8 F (37.1 C) (03/19 0505) Pulse Rate:  [100-118] 100 (03/19 0505) Resp:  [16-19] 19 (03/19 0505) BP: (152-163)/(83-101) 152/98 (03/19 0505) SpO2:  [97 %-99 %] 97 % (03/19 0505) Last BM Date: 01/20/18  Intake/Output from previous day: 03/18 0701 - 03/19 0700 In: 610 [P.O.:600] Out: 713 [Urine:625; Drains:88] Intake/Output this shift: No intake/output data recorded.  PE: Gen:  Alert, NAD, pleasant Card:  Regular rate and rhythm, pedal pulses 2+ BL Pulm:  Normal effort, clear to auscultation bilaterally R shoulder: incision c/d/i, no surrounding erythema or swelling Abd: Soft, non-tender, non-distended, bowel sounds present, no HSM, incision with provena VAC in place with sanguinous drainage in VAC cannister. L drain with moderate serosanguinous drainage. R drain with minimal sanguinous drainage and clot material in bulb and tubing. No surrounding erythema or discrete mass felt.  Ext: ex-fix present on LLE Skin: warm and dry, no rashes  Psych: A&Ox3     Lab Results:  Recent Labs    01/22/18 0525 01/23/18 0635  WBC 10.5 11.0*  HGB 9.6* 9.9*  HCT 30.8* 32.2*  PLT 403* 403*   BMET Recent Labs    01/21/18 0923 01/23/18 0635  NA 136  --   K 3.9  --   CL 102  --   CO2 26  --   GLUCOSE 104*  --   BUN 12  --   CREATININE 0.93 0.91  CALCIUM 8.0*  --    PT/INR No results for input(s): LABPROT, INR in the last 72 hours. CMP     Component Value Date/Time   NA 136 01/21/2018 0923   K 3.9 01/21/2018 0923   CL 102 01/21/2018 0923   CO2 26 01/21/2018 0923   GLUCOSE 104 (H) 01/21/2018 0923   BUN  12 01/21/2018 0923   CREATININE 0.91 01/23/2018 0635   CALCIUM 8.0 (L) 01/21/2018 0923   PROT 7.9 12/23/2016 0818   ALBUMIN 3.7 12/23/2016 0818   AST 23 12/23/2016 0818   ALT 20 12/23/2016 0818   ALKPHOS 80 12/23/2016 0818   BILITOT 0.7 12/23/2016 0818   GFRNONAA >60 01/23/2018 0635   GFRAA >60 01/23/2018 0635   Lipase     Component Value Date/Time   LIPASE 19 12/23/2016 0818       Studies/Results: No results found.  Anti-infectives: Anti-infectives (From admission, onward)   Start     Dose/Rate Route Frequency Ordered Stop   01/19/18 1800  ceFAZolin (ANCEF) 3 g in dextrose 5 % 50 mL IVPB     3 g 160 mL/hr over 30 Minutes Intravenous To ShortStay Surgical 01/19/18 1720 01/20/18 1800   01/19/18 1200  ceFAZolin (ANCEF) 3 g in dextrose 5 % 50 mL IVPB     3 g 160 mL/hr over 30 Minutes Intravenous To ShortStay Surgical 01/19/18 1157 01/19/18 1202   01/17/18 0200  ceFAZolin (ANCEF) IVPB 2g/100 mL premix     2 g 200 mL/hr over 30 Minutes Intravenous Every 6 hours 01/16/18 2226 01/17/18 1430   01/16/18 1915  ceFAZolin (ANCEF) 3 g in dextrose 5 % 50 mL IVPB    Comments:  Anesthesia to give preop  3 g 130 mL/hr over 30 Minutes Intravenous  Once 01/16/18 1859 01/16/18 1922       Assessment/Plan Tibial plateau fracture - per ortho, OR possibly Wednesday  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 Georgette Dover - POD#4 - incision c/d/i, surgical dressing removed - patient may cleanse gently with soap and warm water R abdominal wall soft tissue mass - s/p panniculectomy 01/19/18 Dr. Georgette Dover - POD#4 - provena VAC and JP drains - L drain with 70 cc out; R drain with 18 cc out - continue to flush R JP tubing as needed and continue to monitor - sutures/staples to remain at least 2 weeks ABL anemia - hgb 9.9, stable  FEN: reg diet VTE: SCD, lovenox 95 mg Q24h ID: Ancef 3/12, 3/13, 3/15-16;     LOS: 7 days    Brigid Re , [facility] 01/23/2018, 8:25  AM Pager: [phone] Consults: [phone] Mon-Fri 7:00 am-4:30 pm Sat-Sun 7:00 am-11:30 am

## 2018-01-23 NOTE — H&P (View-Only) (Signed)
Orthopedic Trauma Service Progress Note   Patient ID: Tyler Walters MRN: [id number] DOB/AGE: 1966-05-06 52 y.o.  Subjective:  Doing ok  Ready for surgery tomorrow  No new issues Mild abd cramping but nothing too severe    ROS As above   Objective:   VITALS:   Vitals:   01/22/18 0639 01/22/18 1300 01/22/18 1957 01/23/18 0505  BP: (!) 162/93 (!) 163/101 (!) 158/83 (!) 152/98  Pulse: 96 (!) 118 (!) 114 100  Resp: 17 16 17 19   Temp: 98.4 F (36.9 C) 99.3 F (37.4 C) 99.3 F (37.4 C) 98.8 F (37.1 C)  TempSrc: Oral Oral Oral Oral  SpO2: 98% 99% 99% 97%  Weight:      Height:        Estimated body mass index is 49.57 kg/m as calculated from the following:   Height as of this encounter: 5\' 8"  (1.727 m).   Weight as of this encounter: 147.9 kg (326 lb).   Intake/Output      03/18 0701 - 03/19 0700 03/19 0701 - 03/20 0700   P.O. 600 240   Other 10    Total Intake(mL/kg) 610 (4.1) 240 (1.6)   Urine (mL/kg/hr) 625 (0.2)    Drains 88    Total Output 713    Net -103 +240        Urine Occurrence 3 x      LABS  Results for orders placed or performed during the hospital encounter of 01/16/18 (from the past 24 hour(s))  Creatinine, serum     Status: None   Collection Time: 01/23/18  6:35 AM  Result Value Ref Range   Creatinine, Ser 0.91 0.61 - 1.24 mg/dL   GFR calc non Af Amer >60 >60 mL/min   GFR calc Af Amer >60 >60 mL/min  CBC     Status: Abnormal   Collection Time: 01/23/18  6:35 AM  Result Value Ref Range   WBC 11.0 (H) 4.0 - 10.5 K/uL   RBC 4.07 (L) 4.22 - 5.81 MIL/uL   Hemoglobin 9.9 (L) 13.0 - 17.0 g/dL   HCT 32.2 (L) 39.0 - 52.0 %   MCV 79.1 78.0 - 100.0 fL   MCH 24.3 (L) 26.0 - 34.0 pg   MCHC 30.7 30.0 - 36.0 g/dL   RDW 16.4 (H) 11.5 - 15.5 %   Platelets 403 (H) 150 - 400 K/uL     PHYSICAL EXAM:   Gen: in bed, NAD, appears well  Ext:       Left Lower Extremity   Ex fix stable  Ext warm   Motor and sensory functions  intact  Mild pin drainage    + DP pulse   Assessment/Plan: 4 Days Post-Op   Principal Problem:   Closed bicondylar fracture of left tibial plateau Active Problems:   Pannus, abdominal   Anti-infectives (From admission, onward)   Start     Dose/Rate Route Frequency Ordered Stop   01/19/18 1800  ceFAZolin (ANCEF) 3 g in dextrose 5 % 50 mL IVPB     3 g 160 mL/hr over 30 Minutes Intravenous To ShortStay Surgical 01/19/18 1720 01/20/18 1800   01/19/18 1200  ceFAZolin (ANCEF) 3 g in dextrose 5 % 50 mL IVPB     3 g 160 mL/hr over 30 Minutes Intravenous To ShortStay Surgical 01/19/18 1157 01/19/18 1202   01/17/18 0200  ceFAZolin (ANCEF) IVPB 2g/100 mL premix     2 g 200 mL/hr over 30 Minutes Intravenous Every 6  hours 01/16/18 2226 01/17/18 1430   01/16/18 1915  ceFAZolin (ANCEF) 3 g in dextrose 5 % 50 mL IVPB    Comments:  Anesthesia to give preop   3 g 130 mL/hr over 30 Minutes Intravenous  Once 01/16/18 1859 01/16/18 1922    .  POD/HD#: 57  52 y/o male s/p fall off ladder with closed L bicondylar tibial plateau fracture    - closed L bicondylar tibial plateau fracture s/p Ex fix              OR tomorrow with Dr. Doreatha Martin              NWB L leg              Ice and elevate L leg             Move toes and ankle    - massive abdominal pannus, R chest wall lipoma s/p panniculectomy and excision R chest wall lipoma/mass             per general surgery    - Pain management:             Continue with current regimen    - ABL anemia/Hemodynamics             Stable    - DVT/PE prophylaxis:             lovenox - weight based given obesity              Hold this evening   - Activity:             NWB L leg    - FEN/Foley/Lines:             reg diet    NPO after MN    -Ex-fix/Splint care:             Ok to manipulate leg by ex fix              Pin care as needed    - Dispo:          OR tomorrow for Left tibial plateau    Jari Pigg, PA-C [facility] [phone] (P) [phone] [name] (C) 01/23/2018, 9:16 AM

## 2018-01-24 ENCOUNTER — Encounter (HOSPITAL_COMMUNITY)
Admission: EM | Disposition: A | Discharge status: Rehabilitation Facility | Payer: Self-pay | Source: Home / Self Care | Attending: Orthopedic Surgery

## 2018-01-24 ENCOUNTER — Inpatient Hospital Stay (HOSPITAL_COMMUNITY): Payer: Medicaid Other | Admitting: Certified Registered Nurse Anesthetist

## 2018-01-24 ENCOUNTER — Inpatient Hospital Stay (HOSPITAL_COMMUNITY): Payer: Medicaid Other

## 2018-01-24 ENCOUNTER — Encounter (HOSPITAL_COMMUNITY): Payer: Self-pay | Admitting: *Deleted

## 2018-01-24 HISTORY — PX: EXTERNAL FIXATION REMOVAL: SHX5040

## 2018-01-24 HISTORY — PX: ORIF TIBIA PLATEAU: SHX2132

## 2018-01-24 LAB — COMPREHENSIVE METABOLIC PANEL
ALBUMIN: 2.4 g/dL — AB (ref 3.5–5.0)
ALK PHOS: 63 U/L (ref 38–126)
ALT: 18 U/L (ref 17–63)
ANION GAP: 9 (ref 5–15)
AST: 22 U/L (ref 15–41)
BILIRUBIN TOTAL: 0.6 mg/dL (ref 0.3–1.2)
BUN: 13 mg/dL (ref 6–20)
CALCIUM: 8.2 mg/dL — AB (ref 8.9–10.3)
CO2: 27 mmol/L (ref 22–32)
Chloride: 99 mmol/L — ABNORMAL LOW (ref 101–111)
Creatinine, Ser: 0.93 mg/dL (ref 0.61–1.24)
GFR calc non Af Amer: >60 mL/min (ref >60)
GLUCOSE: 96 mg/dL (ref 65–99)
Potassium: 3.8 mmol/L (ref 3.5–5.1)
Sodium: 135 mmol/L (ref 135–145)
TOTAL PROTEIN: 6.2 g/dL — AB (ref 6.5–8.1)

## 2018-01-24 LAB — CBC
HEMATOCRIT: 31.2 % — AB (ref 39.0–52.0)
HEMOGLOBIN: 9.5 g/dL — AB (ref 13.0–17.0)
MCH: 24 pg — AB (ref 26.0–34.0)
MCHC: 30.4 g/dL (ref 30.0–36.0)
MCV: 78.8 fL (ref 78.0–100.0)
Platelets: 459 10*3/uL — ABNORMAL HIGH (ref 150–400)
RBC: 3.96 MIL/uL — ABNORMAL LOW (ref 4.22–5.81)
RDW: 16 % — ABNORMAL HIGH (ref 11.5–15.5)
WBC: 11.5 10*3/uL — ABNORMAL HIGH (ref 4.0–10.5)

## 2018-01-24 LAB — PROTIME-INR
INR: 1.15
PROTHROMBIN TIME: 14.7 s (ref 11.4–15.2)

## 2018-01-24 LAB — APTT: APTT: 35 s (ref 24–36)

## 2018-01-24 SURGERY — OPEN REDUCTION INTERNAL FIXATION (ORIF) TIBIAL PLATEAU
Anesthesia: General | Site: Leg Upper | Laterality: Left

## 2018-01-24 MED ORDER — TOBRAMYCIN SULFATE 1.2 G IJ SOLR
INTRAMUSCULAR | Status: DC | PRN
  Administered 2018-01-24: 1.2 g

## 2018-01-24 MED ORDER — HYDROMORPHONE HCL 1 MG/ML IJ SOLN
INTRAMUSCULAR | Status: AC
  Filled 2018-01-24: qty 1

## 2018-01-24 MED ORDER — CEFAZOLIN SODIUM-DEXTROSE 2-4 GM/100ML-% IV SOLN
2.0000 g | Freq: Three times a day (TID) | INTRAVENOUS | Status: AC
  Administered 2018-01-24 – 2018-01-25 (×3): 2 g via INTRAVENOUS
  Filled 2018-01-24 (×3): qty 100

## 2018-01-24 MED ORDER — LACTATED RINGERS IV SOLN
INTRAVENOUS | Status: DC
  Administered 2018-01-24 (×3): via INTRAVENOUS

## 2018-01-24 MED ORDER — FENTANYL CITRATE (PF) 250 MCG/5ML IJ SOLN
INTRAMUSCULAR | Status: DC | PRN
  Administered 2018-01-24: 50 ug via INTRAVENOUS
  Administered 2018-01-24: 100 ug via INTRAVENOUS
  Administered 2018-01-24: 50 ug via INTRAVENOUS
  Administered 2018-01-24 (×3): 100 ug via INTRAVENOUS
  Administered 2018-01-24 (×2): 50 ug via INTRAVENOUS
  Administered 2018-01-24: 150 ug via INTRAVENOUS

## 2018-01-24 MED ORDER — ONDANSETRON HCL 4 MG/2ML IJ SOLN
INTRAMUSCULAR | Status: DC | PRN
  Administered 2018-01-24: 4 mg via INTRAVENOUS

## 2018-01-24 MED ORDER — DEXMEDETOMIDINE HCL 200 MCG/2ML IV SOLN
INTRAVENOUS | Status: DC | PRN
  Administered 2018-01-24: 12 ug via INTRAVENOUS
  Administered 2018-01-24: 8 ug via INTRAVENOUS
  Administered 2018-01-24 (×2): 20 ug via INTRAVENOUS
  Administered 2018-01-24: 12 ug via INTRAVENOUS
  Administered 2018-01-24: 8 ug via INTRAVENOUS
  Administered 2018-01-24: 12 ug via INTRAVENOUS
  Administered 2018-01-24: 8 ug via INTRAVENOUS
  Administered 2018-01-24: 20 ug via INTRAVENOUS

## 2018-01-24 MED ORDER — MIDAZOLAM HCL 5 MG/5ML IJ SOLN
INTRAMUSCULAR | Status: DC | PRN
  Administered 2018-01-24: 2 mg via INTRAVENOUS

## 2018-01-24 MED ORDER — MEPERIDINE HCL 50 MG/ML IJ SOLN: 6.2500 mg | INTRAMUSCULAR | Status: DC | PRN

## 2018-01-24 MED ORDER — VANCOMYCIN HCL 1000 MG IV SOLR
INTRAVENOUS | Status: AC
  Filled 2018-01-24: qty 1000

## 2018-01-24 MED ORDER — OXYCODONE HCL 5 MG PO TABS
ORAL_TABLET | ORAL | Status: AC
  Filled 2018-01-24: qty 2

## 2018-01-24 MED ORDER — PROPOFOL 10 MG/ML IV BOLUS
INTRAVENOUS | Status: DC | PRN
  Administered 2018-01-24: 200 mg via INTRAVENOUS

## 2018-01-24 MED ORDER — 0.9 % SODIUM CHLORIDE (POUR BTL) OPTIME
TOPICAL | Status: DC | PRN
  Administered 2018-01-24: 1000 mL

## 2018-01-24 MED ORDER — VANCOMYCIN HCL 1000 MG IV SOLR
INTRAVENOUS | Status: DC | PRN
  Administered 2018-01-24: 1000 mg via TOPICAL

## 2018-01-24 MED ORDER — FENTANYL CITRATE (PF) 250 MCG/5ML IJ SOLN
INTRAMUSCULAR | Status: AC
  Filled 2018-01-24: qty 5

## 2018-01-24 MED ORDER — DEXAMETHASONE SODIUM PHOSPHATE 10 MG/ML IJ SOLN
INTRAMUSCULAR | Status: DC | PRN
  Administered 2018-01-24: 10 mg via INTRAVENOUS

## 2018-01-24 MED ORDER — BACITRACIN ZINC 500 UNIT/GM EX OINT
TOPICAL_OINTMENT | CUTANEOUS | Status: AC
  Filled 2018-01-24: qty 28.35

## 2018-01-24 MED ORDER — CEFAZOLIN SODIUM-DEXTROSE 2-4 GM/100ML-% IV SOLN
INTRAVENOUS | Status: AC
Start: 2018-01-24 — End: ?
  Filled 2018-01-24: qty 100

## 2018-01-24 MED ORDER — PROMETHAZINE HCL 25 MG/ML IJ SOLN
INTRAMUSCULAR | Status: AC
  Filled 2018-01-24: qty 1

## 2018-01-24 MED ORDER — PROMETHAZINE HCL 25 MG/ML IJ SOLN
6.2500 mg | INTRAMUSCULAR | Status: DC | PRN
  Administered 2018-01-24: 6.25 mg via INTRAVENOUS

## 2018-01-24 MED ORDER — SUCCINYLCHOLINE CHLORIDE 20 MG/ML IJ SOLN
INTRAMUSCULAR | Status: DC | PRN
Start: 2018-01-24 — End: 2018-01-24
  Administered 2018-01-24: 120 mg via INTRAVENOUS

## 2018-01-24 MED ORDER — ROCURONIUM BROMIDE 100 MG/10ML IV SOLN
INTRAVENOUS | Status: DC | PRN
  Administered 2018-01-24: 10 mg via INTRAVENOUS
  Administered 2018-01-24: 30 mg via INTRAVENOUS
  Administered 2018-01-24: 70 mg via INTRAVENOUS

## 2018-01-24 MED ORDER — PROPOFOL 10 MG/ML IV BOLUS
INTRAVENOUS | Status: AC
  Filled 2018-01-24: qty 20

## 2018-01-24 MED ORDER — LIDOCAINE HCL (CARDIAC) 20 MG/ML IV SOLN
INTRAVENOUS | Status: DC | PRN
  Administered 2018-01-24: 100 mg via INTRATRACHEAL

## 2018-01-24 MED ORDER — SUGAMMADEX SODIUM 500 MG/5ML IV SOLN
INTRAVENOUS | Status: DC | PRN
  Administered 2018-01-24: 300 mg via INTRAVENOUS

## 2018-01-24 MED ORDER — TOBRAMYCIN SULFATE 1.2 G IJ SOLR
INTRAMUSCULAR | Status: AC
  Filled 2018-01-24: qty 1.2

## 2018-01-24 MED ORDER — MIDAZOLAM HCL 2 MG/2ML IJ SOLN
INTRAMUSCULAR | Status: AC
  Filled 2018-01-24: qty 2

## 2018-01-24 MED ORDER — PHENYLEPHRINE HCL 10 MG/ML IJ SOLN
INTRAVENOUS | Status: DC | PRN
  Administered 2018-01-24: 25 ug/min via INTRAVENOUS

## 2018-01-24 MED ORDER — HYDROMORPHONE HCL 1 MG/ML IJ SOLN
0.2500 mg | INTRAMUSCULAR | Status: DC | PRN
  Administered 2018-01-24 (×4): 0.5 mg via INTRAVENOUS

## 2018-01-24 SURGICAL SUPPLY — 87 items
BANDAGE ACE 4X5 VEL STRL LF (GAUZE/BANDAGES/DRESSINGS) ×4 IMPLANT
BANDAGE ACE 6X5 VEL STRL LF (GAUZE/BANDAGES/DRESSINGS) ×2 IMPLANT
BANDAGE ESMARK 6X9 LF (GAUZE/BANDAGES/DRESSINGS) ×2 IMPLANT
BIT DRILL 2.5 X LONG (BIT) ×2
BIT DRILL CALIBR QC 2.5X250 (BIT) ×2 IMPLANT
BIT DRILL CALIBR QC 2.8X250 (BIT) ×2 IMPLANT
BIT DRILL QC 3.5X110 (BIT) ×2 IMPLANT
BIT DRILL X LONG 2.5 (BIT) IMPLANT
BLADE CLIPPER SURG (BLADE) IMPLANT
BLADE SURG 10 STRL SS (BLADE) ×2 IMPLANT
BLADE SURG 15 STRL LF DISP TIS (BLADE) ×2 IMPLANT
BLADE SURG 15 STRL SS (BLADE) ×8
BNDG CMPR 9X6 STRL LF SNTH (GAUZE/BANDAGES/DRESSINGS) ×2
BNDG CMPR MED 10X6 ELC LF (GAUZE/BANDAGES/DRESSINGS) ×2
BNDG ELASTIC 6X10 VLCR STRL LF (GAUZE/BANDAGES/DRESSINGS) ×2 IMPLANT
BNDG ESMARK 6X9 LF (GAUZE/BANDAGES/DRESSINGS) ×4
BNDG GAUZE ELAST 4 BULKY (GAUZE/BANDAGES/DRESSINGS) ×2 IMPLANT
BRUSH SCRUB SURG 4.25 DISP (MISCELLANEOUS) ×8 IMPLANT
CANISTER SUCT 3000ML PPV (MISCELLANEOUS) ×4 IMPLANT
CHLORAPREP W/TINT 26ML (MISCELLANEOUS) ×4 IMPLANT
COVER SURGICAL LIGHT HANDLE (MISCELLANEOUS) ×4 IMPLANT
CUFF TOURNIQUET SINGLE 34IN LL (TOURNIQUET CUFF) ×4 IMPLANT
DRAPE C-ARM 42X72 X-RAY (DRAPES) ×4 IMPLANT
DRAPE C-ARMOR (DRAPES) ×4 IMPLANT
DRAPE ORTHO SPLIT 77X108 STRL (DRAPES) ×8
DRAPE SURG ORHT 6 SPLT 77X108 (DRAPES) ×4 IMPLANT
DRAPE U-SHAPE 47X51 STRL (DRAPES) ×4 IMPLANT
DRILL BIT X LONG 2.5 (BIT) ×4
DRSG PAD ABDOMINAL 8X10 ST (GAUZE/BANDAGES/DRESSINGS) ×8 IMPLANT
ELECT REM PT RETURN 9FT ADLT (ELECTROSURGICAL) ×4
ELECTRODE REM PT RTRN 9FT ADLT (ELECTROSURGICAL) ×2 IMPLANT
GAUZE SPONGE 4X4 12PLY STRL (GAUZE/BANDAGES/DRESSINGS) ×4 IMPLANT
GLOVE BIO SURGEON STRL SZ7.5 (GLOVE) ×16 IMPLANT
GLOVE BIOGEL PI IND STRL 7.5 (GLOVE) ×2 IMPLANT
GLOVE BIOGEL PI INDICATOR 7.5 (GLOVE) ×2
GOWN STRL REUS W/ TWL LRG LVL3 (GOWN DISPOSABLE) ×4 IMPLANT
GOWN STRL REUS W/TWL LRG LVL3 (GOWN DISPOSABLE) ×8
IMMOBILIZER KNEE 20 (SOFTGOODS) ×4
IMMOBILIZER KNEE 20 THIGH 36 (SOFTGOODS) IMPLANT
IMMOBILIZER KNEE 22 UNIV (SOFTGOODS) ×4 IMPLANT
KIT BASIN OR (CUSTOM PROCEDURE TRAY) ×4 IMPLANT
KIT ROOM TURNOVER OR (KITS) ×4 IMPLANT
NDL SUT 6 .5 CRC .975X.05 MAYO (NEEDLE) IMPLANT
NEEDLE MAYO TAPER (NEEDLE) ×8
NS IRRIG 1000ML POUR BTL (IV SOLUTION) ×4 IMPLANT
PACK TOTAL JOINT (CUSTOM PROCEDURE TRAY) ×4 IMPLANT
PAD ABD 8X10 STRL (GAUZE/BANDAGES/DRESSINGS) ×4 IMPLANT
PAD ARMBOARD 7.5X6 YLW CONV (MISCELLANEOUS) ×8 IMPLANT
PAD CAST 4YDX4 CTTN HI CHSV (CAST SUPPLIES) ×2 IMPLANT
PADDING CAST COTTON 4X4 STRL (CAST SUPPLIES) ×4
PADDING CAST COTTON 6X4 STRL (CAST SUPPLIES) ×4 IMPLANT
PLATE TIBIA PROX 12H 3.5VA  LT (Plate) ×2 IMPLANT
PLATE TIBIA PROX 12H 3.5VA LT (Plate) IMPLANT
SCREW CORTEX 3.5 36MM (Screw) ×2 IMPLANT
SCREW CORTEX 3.5 40MM (Screw) ×4 IMPLANT
SCREW CORTEX 3.5 45MM (Screw) ×2 IMPLANT
SCREW CORTEX 3.5 50MM (Screw) ×2 IMPLANT
SCREW CORTEX 3.5 65MM (Screw) ×2 IMPLANT
SCREW CORTEX 3.5 70MM SELF TAP (Screw) ×2 IMPLANT
SCREW LOCK 3.5X85 (Screw) ×4 IMPLANT
SCREW LOCK CORT ST 3.5X36 (Screw) IMPLANT
SCREW LOCK CORT ST 3.5X40 (Screw) IMPLANT
SCREW LOCK VA 3.5X40MM (Screw) ×4 IMPLANT
SCREW LOCKING VA 3.5X85MM (Screw) ×6 IMPLANT
STAPLER VISISTAT 35W (STAPLE) ×4 IMPLANT
SUCTION FRAZIER HANDLE 10FR (MISCELLANEOUS) ×2
SUCTION TUBE FRAZIER 10FR DISP (MISCELLANEOUS) ×2 IMPLANT
SUT ETHILON 2 0 FS 18 (SUTURE) ×6 IMPLANT
SUT ETHILON 3 0 PS 1 (SUTURE) ×4 IMPLANT
SUT FIBERWIRE #2 38 T-5 BLUE (SUTURE) ×8
SUT MNCRL AB 3-0 PS2 18 (SUTURE) ×4 IMPLANT
SUT PROLENE 0 CT (SUTURE) IMPLANT
SUT PROLENE 0 CT 2 (SUTURE) ×8 IMPLANT
SUT VIC AB 0 CT1 27 (SUTURE) ×4
SUT VIC AB 0 CT1 27XBRD ANBCTR (SUTURE) ×2 IMPLANT
SUT VIC AB 1 CT1 18XCR BRD 8 (SUTURE) IMPLANT
SUT VIC AB 1 CT1 27 (SUTURE) ×12
SUT VIC AB 1 CT1 27XBRD ANBCTR (SUTURE) ×2 IMPLANT
SUT VIC AB 1 CT1 8-18 (SUTURE) ×4
SUT VIC AB 2-0 CT1 27 (SUTURE) ×16
SUT VIC AB 2-0 CT1 TAPERPNT 27 (SUTURE) ×4 IMPLANT
SUTURE FIBERWR #2 38 T-5 BLUE (SUTURE) IMPLANT
TOWEL OR 17X26 10 PK STRL BLUE (TOWEL DISPOSABLE) ×8 IMPLANT
TRAY FOLEY W/METER SILVER 16FR (SET/KITS/TRAYS/PACK) IMPLANT
WASHER 7MM DIA (Washer) ×2 IMPLANT
WATER STERILE IRR 1000ML POUR (IV SOLUTION) ×8 IMPLANT
YANKAUER SUCT BULB TIP NO VENT (SUCTIONS) ×2 IMPLANT

## 2018-01-24 NOTE — Transfer of Care (Signed)
Immediate Anesthesia Transfer of Care Note  Patient: Tyler Walters  Procedure(s) Performed: OPEN REDUCTION INTERNAL FIXATION (ORIF) TIBIAL PLATEAU (Left Knee) REMOVAL EXTERNAL FIXATION LEG (Left Leg Upper)  Patient Location: PACU  Anesthesia Type:General  Level of Consciousness: awake, alert  and oriented  Airway & Oxygen Therapy: Patient Spontanous Breathing and Patient connected to nasal cannula oxygen  Post-op Assessment: Report given to RN and Post -op Vital signs reviewed and stable  Post vital signs: Reviewed and stable  Last Vitals:  Vitals:   01/24/18 0459 01/24/18 1531  BP: 129/76 127/81  Pulse: 100 97  Resp:  (!) 31  Temp: 36.8 C 36.5 C  SpO2: 99% 94%    Last Pain:  Vitals:   01/24/18 1529  TempSrc:   PainSc: Asleep      Patients Stated Pain Goal: 3 (90/21/11 5520)  Complications: No apparent anesthesia complications

## 2018-01-24 NOTE — Progress Notes (Signed)
[facility] Progress Note  Day of Surgery  Subjective: CC: cramping pain Patient seen in pre-op holding, going for ORIF today. Tolerating diet. Denies n/v. Cramping pain slightly improved with increase in robaxin yesterday.  UOP good. VSS.   Objective: Vital signs in last 24 hours: Temp:  [98.1 F (36.7 C)-98.6 F (37 C)] 98.3 F (36.8 C) (03/20 0459) Pulse Rate:  [100-106] 100 (03/20 0459) Resp:  [18-20] 18 (03/19 1951) BP: (129-141)/(76-96) 129/76 (03/20 0459) SpO2:  [96 %-99 %] 99 % (03/20 0459) Weight:  [147.9 kg (326 lb)] 147.9 kg (326 lb) (03/20 1020) Last BM Date: 01/20/18  Intake/Output from previous day: 03/19 0701 - 03/20 0700 In: 960 [P.O.:960] Out: 27 [Drains:27] Intake/Output this shift: No intake/output data recorded.  PE: Gen: Alert, NAD, pleasant Card: Regular rate and rhythm, pedal pulses 2+ BL Pulm: Normal effort, clear to auscultation bilaterally R shoulder: incision c/d/i, no surrounding erythema or swelling Abd: Soft, non-tender, non-distended, bowel sounds present, no HSM, incisionwith provena VAC in place. L drain with moderate serosanguinous drainage. R drain with minimal sanguinous drainage and clot material in bulb and tubing. No surrounding erythema or discrete mass felt.  Ext: ex-fix present on LLE Skin: warm and dry, no rashes  Psych: A&Ox3    Lab Results:  Recent Labs    01/23/18 0635 01/24/18 0623  WBC 11.0* 11.5*  HGB 9.9* 9.5*  HCT 32.2* 31.2*  PLT 403* 459*   BMET Recent Labs    01/23/18 0635 01/24/18 0623  NA  --  135  K  --  3.8  CL  --  99*  CO2  --  27  GLUCOSE  --  96  BUN  --  13  CREATININE 0.91 0.93  CALCIUM  --  8.2*   PT/INR Recent Labs    01/24/18 0623  LABPROT 14.7  INR 1.15   CMP     Component Value Date/Time   NA 135 01/24/2018 0623   K 3.8 01/24/2018 0623   CL 99 (L) 01/24/2018 0623   CO2 27 01/24/2018 0623   GLUCOSE 96 01/24/2018 0623   BUN 13 01/24/2018 0623   CREATININE  0.93 01/24/2018 0623   CALCIUM 8.2 (L) 01/24/2018 0623   PROT 6.2 (L) 01/24/2018 0623   ALBUMIN 2.4 (L) 01/24/2018 0623   AST 22 01/24/2018 0623   ALT 18 01/24/2018 0623   ALKPHOS 63 01/24/2018 0623   BILITOT 0.6 01/24/2018 0623   GFRNONAA >60 01/24/2018 0623   GFRAA >60 01/24/2018 0623   Lipase     Component Value Date/Time   LIPASE 19 12/23/2016 0818       Studies/Results: No results found.  Anti-infectives: Anti-infectives (From admission, onward)   Start     Dose/Rate Route Frequency Ordered Stop   01/24/18 0800  [MAR Hold]  ceFAZolin (ANCEF) 3 g in dextrose 5 % 50 mL IVPB     (MAR Hold since 01/24/18 1014)   3 g 130 mL/hr over 30 Minutes Intravenous  Once 01/23/18 0921     01/19/18 1800  ceFAZolin (ANCEF) 3 g in dextrose 5 % 50 mL IVPB     3 g 160 mL/hr over 30 Minutes Intravenous To ShortStay Surgical 01/19/18 1720 01/20/18 1800   01/19/18 1200  ceFAZolin (ANCEF) 3 g in dextrose 5 % 50 mL IVPB     3 g 160 mL/hr over 30 Minutes Intravenous To ShortStay Surgical 01/19/18 1157 01/19/18 1202   01/17/18 0200  ceFAZolin (ANCEF) IVPB 2g/100 mL  premix     2 g 200 mL/hr over 30 Minutes Intravenous Every 6 hours 01/16/18 2226 01/17/18 1430   01/16/18 1915  ceFAZolin (ANCEF) 3 g in dextrose 5 % 50 mL IVPB    Comments:  Anesthesia to give preop   3 g 130 mL/hr over 30 Minutes Intravenous  Once 01/16/18 1859 01/16/18 1922       Assessment/Plan Tibial plateau fracture - per ortho, OR today  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 Georgette Dover - POD#5 - incision c/d/i, surgical dressing removed - patient may cleanse gently with soap and warm water R abdominal wall soft tissue mass - s/p panniculectomy 01/19/18 Dr. Georgette Dover - POD#5 - provena VAC and JP drains - L drain with 15 cc out; R drain with 12 cc out - continue to flush R JP tubing as needed and continue to monitor - sutures/staples to remain at least 2 weeks ABL anemia - hgb 9.5, stable  FEN: reg diet VTE:  SCD, lovenox 95 mg Q24h ID: Ancef 3/12, 3/13, 3/15-16;  Will discontinue drains tomorrow if output remains low. D/C provena VAC from abdomen Friday.   LOS: 8 days    Brigid Re , [facility] 01/24/2018, 10:27 AM Pager: [phone] Consults: [phone] Mon-Fri 7:00 am-4:30 pm Sat-Sun 7:00 am-11:30 am

## 2018-01-24 NOTE — Anesthesia Procedure Notes (Signed)
Procedure Name: Intubation Date/Time: 01/24/2018 11:23 AM Performed by: Mariea Clonts, CRNA Pre-anesthesia Checklist: Patient identified, Emergency Drugs available, Suction available and Patient being monitored Patient Re-evaluated:Patient Re-evaluated prior to induction Oxygen Delivery Method: Circle System Utilized Preoxygenation: Pre-oxygenation with 100% oxygen Induction Type: IV induction Ventilation: Mask ventilation without difficulty Laryngoscope Size: Glidescope and 4 Grade View: Grade I Tube type: Oral Number of attempts: 1 Airway Equipment and Method: Stylet and Oral airway Placement Confirmation: ETT inserted through vocal cords under direct vision,  positive ETCO2 and breath sounds checked- equal and bilateral Tube secured with: Tape Dental Injury: Teeth and Oropharynx as per pre-operative assessment  Difficulty Due To: Difficulty was anticipated

## 2018-01-24 NOTE — Progress Notes (Signed)
Orthopedic Tech Progress Note Patient Details:  Tyler Walters 05-Feb-1966 [id number]  Ortho Devices Type of Ortho Device: Knee Immobilizer Ortho Device/Splint Location: Left leg Ortho Device/Splint Interventions: Ordered   Post Interventions Patient Tolerated: Well Instructions Provided: Care of device As ordered by Dr. Gretel Acre, Jaye Saal 01/24/2018, 3:01 PM

## 2018-01-24 NOTE — Anesthesia Preprocedure Evaluation (Addendum)
Anesthesia Evaluation  Patient identified by MRN, date of birth, ID band Patient awake    Reviewed: Allergy & Precautions, NPO status , Patient's Chart, lab work & pertinent test results  Airway Mallampati: II  TM Distance: >3 FB Neck ROM: Full    Dental no notable dental hx. (+) Teeth Intact, Loose,    Pulmonary neg pulmonary ROS,    Pulmonary exam normal breath sounds clear to auscultation       Cardiovascular negative cardio ROS Normal cardiovascular exam Rhythm:Regular Rate:Normal     Neuro/Psych negative neurological ROS     GI/Hepatic negative GI ROS, Neg liver ROS,   Endo/Other  Morbid obesity  Renal/GU negative Renal ROS     Musculoskeletal negative musculoskeletal ROS (+) Bicondylar left Tibial plateau Fx   Abdominal (+) + obese,   Peds  Hematology   Anesthesia Other Findings   Reproductive/Obstetrics                            Anesthesia Physical Anesthesia Plan  ASA: III  Anesthesia Plan: General   Post-op Pain Management:    Induction: Intravenous  PONV Risk Score and Plan: 3 and Midazolam, Dexamethasone, Ondansetron and Treatment may vary due to age or medical condition  Airway Management Planned: Oral ETT  Additional Equipment:   Intra-op Plan:   Post-operative Plan: Extubation in OR  Informed Consent: I have reviewed the patients History and Physical, chart, labs and discussed the procedure including the risks, benefits and alternatives for the proposed anesthesia with the patient or authorized representative who has indicated his/her understanding and acceptance.   Dental advisory given  Plan Discussed with: CRNA, Anesthesiologist and Surgeon  Anesthesia Plan Comments:         Anesthesia Quick Evaluation

## 2018-01-24 NOTE — Interval H&P Note (Signed)
History and Physical Interval Note:  01/24/2018 10:50 AM  Tyler Walters  has presented today for surgery, with the diagnosis of LEFT Tibial plateau fracture  The various methods of treatment have been discussed with the patient and family. After consideration of risks, benefits and other options for treatment, the patient has consented to  Procedure(s): OPEN REDUCTION INTERNAL FIXATION (ORIF) TIBIAL PLATEAU (Left) as a surgical intervention .  The patient's history has been reviewed, patient examined, no change in status, stable for surgery.  I have reviewed the patient's chart and labs.  Questions were answered to the patient's satisfaction.     Lennette Bihari P Ruther Ephraim

## 2018-01-24 NOTE — Op Note (Signed)
OrthopaedicSurgeryOperativeNote (IRC:[id number]) Date of Surgery: 01/24/2018  Admit Date: 01/16/2018   Diagnoses: Pre-Op Diagnoses: Left bicondylar tibial plateau fracture Left tibial shaft facture with fracture of tibial tubercle  Post-Op Diagnosis: Left bicondylar tibial plateau fracture Left tibial shaft facture with fracture of tibial tubercle Left peripheral lateral meniscus tear  Procedures: 1. CPT 27536-ORIF of left bicondylar tibial plateau fracture 2. CPT 27758-ORIF of left tibial shaft 3. CPT 27540-ORIF of tibial tubercle fracture 4. CPT 27403-Repair of lateral meniscus 5. CPT 20694-Removal of external fixator 6. CPT 11044-Debridement of external fixator pin sites  Surgeons: Primary: Haddix, Thomasene Lot, MD   Location:[facility] OR ROOM 07   AnesthesiaGeneral   Antibiotics:Ancef 3g preop  Tourniquettime:None used  PZWCHENIDPOEUMPNTI:144 mL   Complications:None  Specimens:None  Implants: Implant Name Type Inv. Item Serial No. Manufacturer Lot No. LRB No. Used Action  SCREW CORTEX 3.5 50MM - [id number] Screw SCREW CORTEX 3.5 50MM  [employer]  Left 1 Implanted  3.5 VA Proximal tibial plate 12 hole  Orthopedic Implant   [employer]  Left 1 Implanted  SCREW LOCK 3.5X85 - [id number] Screw SCREW LOCK 3.5X85  [employer]  Left 2 Implanted  WASHER 7MM DIA - [id number] Washer WASHER 7MM DIA  [employer]  Left 1 Implanted  SCREW CORTEX 3.5 65MM - [id number] Screw SCREW CORTEX 3.5 65MM  [employer]  Left 1 Implanted  SCREW CORTEX 3.5 70MM SELF TAP - [id number] Screw SCREW CORTEX 3.5 70MM SELF TAP  [employer]  Left 1 Implanted  SCREW LOCK VA 3.5X40MM - [id number] Screw SCREW LOCK VA 3.5X40MM  [employer]  Left 1 Implanted  SCREW LOCKING VA 3.5X85MM - [id number] Screw SCREW LOCKING VA 3.5X85MM  [employer]  Left 3 Implanted  SCREW CORTEX 3.5 45MM - [id number] Screw SCREW CORTEX 3.5 45MM  [employer]  Left 1 Implanted  SCREW CORTEX 3.5 40MM - [id number]  Screw SCREW CORTEX 3.5 40MM  [employer]  Left 2 Implanted  SCREW CORTEX 3.5 36MM - [id number] Screw SCREW CORTEX 3.5 36MM  [employer]  Left 1 Implanted    IndicationsforSurgery: 52 year old male with morbid obesity who fell off a stepladder and sustained a complex bicondylar tibial plateau fracture with associated comminution of the tibial shaft with fracture extension into the tibial tubercle.  He underwent provisional external fixation and was referred to the orthopedic trauma team due to the complexity of his fracture.  After reviewing the images I felt that definitive ORIF of his fracture was most appropriate.  I discussed with him the risks and benefits of proceeding with surgery. Risks discussed included bleeding requiring blood transfusion, bleeding causing a hematoma, infection, malunion, nonunion, damage to surrounding nerves and blood vessels, pain, hardware prominence or irritation, hardware failure, stiffness, post-traumatic arthritis, DVT/PE, compartment syndrome, and even death.  The patient understood and agreed to proceed with surgical fixation and consent was obtained.  Operative Findings: 1.  Comminuted bicondylar tibial plateau fracture with associated shaft comminution and extension.  The tibial tubercle was a free fragment with the patellar tendon attached. 2.  ORIF of the above injuries.  The tibial plateau fracture was fixed with independent anterior to posterior lag screws for the posterior medial joint fragment.  A 3.5 mm lag screw was used to provide fixation for the tibial tubercle. 3.  The lateral plateau and tibial shaft was fixed with a Synthes variable angle 3.5 LCP proximal tibial locking plate as noted above.  For cortical screws were placed in the shaft and 6 proximal screws  were placed in the metaphyseal region. 4.  External fixator was removed and ex-fix pin sites were debrided.  Procedure: The patient was identified in the preoperative holding area. Consent  was confirmed with the patient and their family and all questions were answered. The operative extremity was marked after confirmation with the patient. he was then brought back to the operating room by our anesthesia colleagues.  The patient was carefully transferred over to a radiolucent flat top table.  He was then placed under general anesthetic.  A bump was placed under his operative hip and the external fixator was then removed. The operative extremity was then prepped and draped in usual sterile fashion. A preoperative timeout was performed to verify the patient, the procedure, and the extremity. Preoperative antibiotics were dosed.  Fluoroscopy was used evaluate the injury and were saved. An anterolateral parapatellar incision was performed and carried through skin and subcutaneous tissue. The overlying fascia was incised in line with the skin incision just lateral to the patellar tendon. It was extended distally into the anterior compartment fascia. Bovie electrocautery was then used to elevated the IT band and musculature off of the anterolateral cortex of the tibia. The release was taken back until the fibular head was palpable. The plane between the IT band and the lateral capsule was developed and the anterior fat pad was resected to expose the capsule.  A submensical arthrotomy was then performed with a 15 blade. Tag sutures were used to retract the capsule and here I was able to visualize the impacted lateral joint and was able to see the meniscus. There was a peripheral tear of the lateral meniscus with full separation from the meniscocapsular junction.  At the posterior lateral corner of the meniscus had split with a radial tear.  I felt that this was reconstructable.  Posterior horn of the meniscus appeared to be still intact and did not appear to be torn. Number 2 Fiberwire was used to throw horizontal mattress sutures through the capsule and into the body of the anterolatera meniscus. A total  of 3 sutures were thrown and these were tagged with hemostats.  There was a split in the anterolateral cortex that was entered into with a Cobb elevator.  The lateral portion of the patellar tendon was had to be elevated anteriorly to access the splint.  There was a large lateral fragment along with an intercalary posterior lateral fragment that had elevate using a Cobb elevator.  I elevated up until it was flush with the femoral condyle.  I held this provisionally with a medially based K wires.  I used AP and lateral fluoroscopy to confirm decent reduction as well as visualizing the reduction itself.  This was done with assistance by fluoroscopy in both the lateral and AP. Once I had elevated the joint sufficiently both with visualization and with fluoroscopy.  I then proceeded to address the posterior medial articular involvement.  It was a relatively small portion of the joint that was involved and I felt that a posterior medial buttress plate would not provide any significant improved fixation.  I felt that percutaneous fixation using 3.5 mm lag screws would be most appropriate.  I localized an incision over the medial aspect of the plateau.  I used a 3.5 mm drill bit to drill the near cortex on the AP and lateral planes.  I then used a 2.5 mm drill bit to finish the far cortex.  I measured and placed a 3.5 mm lag screw.  I repeated this technique with a more laterally based screw to provide another point of fixation of the posterior medial joint. I then used a large periarticular reduction clamp to reduce the lateral plateau split and restore the width of the tibial plateau.  A 3.5 mm lag screw was placed from lateral to medial to hold this lateral splint in place.  A percutaneous incision was then made over the tibial tubercle.  This was split down to the bone.  A 3.5 mm lag screw was placed from anterior lateral to posterior medial to provide fixation of the tibial tubercle segment.  A washer was used  with this screw to provide better compression.  The large periarticular clamp was then removed.  A 3.4mm LCP proximal tibial locking plate was chosen and was aligned to the tibia in both the AP and lateral fluoroscopic views.  The plate was held flush to the lateral cortex and held provisionally with a K wire.  The plate was then slid submuscularly across the fracture ending lateral to the tibial shaft.  Closed reduction techniques were used to align the shaft fracture in the AP and lateral fluoroscopic images.  The fracture was held while a nonlocking 3.5 millimeters screw was used to bring the proximal portion of the plate flush to bone.  A percutaneous incision was made along the distal tibial shaft segment.  A 3.5 mm nonlocking screw was placed to provisionally hold the shaft segment and reduction relative to the plate.  The reduction was then fine tuned and rotation and coronal and sagittal alignment was confirmed.  Locking screws were then placed in the proximal segment gaining good fixation in the medial plateau.  A total of 5 locking screws along with the 3.5 mm nonlocking screw was placed in the proximal segment.  Percutaneous incisions were made to place 3.5 mm nonlocking screws in the tibial shaft.  A total of 4 nonlocking screws were placed.  The incision was thoroughly irrigated. The tag sutures for the capsule were brought through the plate and tied down and the meniscus repair sutures were tied down to the capsule. One gram of vancomycin powder and 1.2 grams of tobramycin powder was placed in the wound and the IT band and anterior tibialis fascia was closed with 0-vicryl suture. The skin was closed with 2-0 vicryl and 3-0 nylon. After the surgical incisions were closed, I then used a knife to debride the skin edges of the ex-fix pin sites.  I also used a knife to debride the fat.  I used a curette to debride the bone tracts.  I then irrigated the sites and placed Vanco and tobramycin powder into  the wound.  These were closed with interrupted 3-0 nylon sutures.  The of the incisions were dressed with bacitracin ointment, adaptic and 4x4s. The knee and leg were dressed with sterile cast padding and an ACE wrap and was placed back in a knee immobilizer. The patient was then awoken from anesthesia and taken to PACU in stable condition.  Post Op Plan/Instructions: The patient will be nonweightbearing to left lower extremity.  He was placed in a hinged knee brace to start range of motion of the knee.  He will receive postoperative antibiotics.  He received Lovenox for DVT prophylaxis.  He will mobilize with physical and occupational therapy.  I was present and performed the entire surgery.  Katha Hamming, MD [facility]

## 2018-01-24 NOTE — Anesthesia Postprocedure Evaluation (Signed)
Anesthesia Post Note  Patient: Tyler Walters  Procedure(s) Performed: OPEN REDUCTION INTERNAL FIXATION (ORIF) TIBIAL PLATEAU (Left Knee) REMOVAL EXTERNAL FIXATION LEG (Left Leg Upper)     Patient location during evaluation: PACU Anesthesia Type: General Level of consciousness: awake and alert and oriented Pain management: pain level controlled Vital Signs Assessment: post-procedure vital signs reviewed and stable Respiratory status: spontaneous breathing, nonlabored ventilation, respiratory function stable and patient connected to nasal cannula oxygen Cardiovascular status: blood pressure returned to baseline and stable Postop Assessment: no apparent nausea or vomiting Anesthetic complications: no    Last Vitals:  Vitals:   01/24/18 1600 01/24/18 1610  BP: 137/90 (!) 151/90  Pulse: (!) 101 100  Resp: (!) 32 (!) 31  Temp:  36.7 C  SpO2: 96% 97%    Last Pain:  Vitals:   01/24/18 1610  TempSrc:   PainSc: Asleep                 Nori Winegar A.

## 2018-01-25 LAB — CBC
HCT: 29 % — ABNORMAL LOW (ref 39.0–52.0)
Hemoglobin: 8.9 g/dL — ABNORMAL LOW (ref 13.0–17.0)
MCH: 24.3 pg — ABNORMAL LOW (ref 26.0–34.0)
MCHC: 30.7 g/dL (ref 30.0–36.0)
MCV: 79.2 fL (ref 78.0–100.0)
PLATELETS: 491 10*3/uL — AB (ref 150–400)
RBC: 3.66 MIL/uL — ABNORMAL LOW (ref 4.22–5.81)
RDW: 16.1 % — ABNORMAL HIGH (ref 11.5–15.5)
WBC: 16.3 10*3/uL — AB (ref 4.0–10.5)

## 2018-01-25 MED ORDER — KETOROLAC TROMETHAMINE 15 MG/ML IJ SOLN
15.0000 mg | Freq: Four times a day (QID) | INTRAMUSCULAR | Status: DC
  Administered 2018-01-25 – 2018-01-26 (×6): 15 mg via INTRAVENOUS
  Filled 2018-01-25 (×7): qty 1

## 2018-01-25 NOTE — Progress Notes (Signed)
Orthopedic Tech Progress Note Patient Details:  [name] Aug 26, 1966 [id number]  Patient ID: Tyler Walters, male   DOB: 05/14/1966, 52 y.o.   MRN: [id number]   [name] 01/25/2018, 9:39 AM Called in bio-tech brace order; spoke with [facility].

## 2018-01-25 NOTE — Progress Notes (Signed)
Physical Therapy Treatment Patient Details Name: Tyler Walters MRN: [id number] DOB: 1966-01-20 Today's Date: 01/25/2018    History of Present Illness Tyler Walters is a 52 y.o. male who sustained a left tibial plateau fracture s/p fall from step ladder and is currently in ex-fix awaiting surgical intervention this week. Pt also found to have growing abdominal mass that has been surgically removed 3/14 with wound vac placed.  Pt had ext fix removed and internalized on 01/24/18.     PT Comments    Pt is progressing nicely with mobility only requiring one person assist today to stand and pivot to the recliner chair.  He was strong enough to maintain NWB on his left leg without someone helping him do so, and he did very well directing his care (telling PT what worked well in previous sessions).  I would like to try to do some small hops if he is able next session and work on standing tolerance (how long can he stand) as well as SL mini squats at EOB to strengthen his right leg.  PT to continue to follow acutely.    Follow Up Recommendations  CIR;Supervision/Assistance - 24 hour     Equipment Recommendations  Rolling walker with 5" wheels;3in1 (PT);Wheelchair (measurements PT);Wheelchair cushion (measurements PT)(20x 20 (and maybe even 22x22))    Recommendations for Other Services Rehab consult     Precautions / Restrictions Precautions Precautions: Fall Required Braces or Orthoses: Other Brace/Splint Other Brace/Splint: L knee bledsoe brace, locked in extension during mobility.  Restrictions LLE Weight Bearing: Non weight bearing    Mobility  Bed Mobility Overal bed mobility: Needs Assistance Bed Mobility: Supine to Sit     Supine to sit: Min assist;HOB elevated     General bed mobility comments: Min assist to help support and progress his left leg to EOB.  Pt with HOB minimally elevated and using bed rail for leverage at trunk.   Transfers Overall transfer level: Needs  assistance   Transfers: Sit to/from Stand;Stand Pivot Transfers Sit to Stand: Min assist;From elevated surface Stand pivot transfers: Min assist;From elevated surface       General transfer comment: Min assist from fully inflated bed to lower recliner chair.  Pt needed assist more for balance and to stabilize his RW from tipping during transitions.  He was not able to hop, merely turned on his right foot, but did a good job of maintaining NWB on his left leg.  Assist needed during transition to sit down to help lift and control his left leg until the recliner part of the chair could be pulled out.   Ambulation/Gait             General Gait Details: Unable at this time.           Balance Overall balance assessment: Needs assistance Sitting-balance support: Feet supported;Single extremity supported Sitting balance-Leahy Scale: Fair Sitting balance - Comments: one hand supported on bed rail in sitting.    Standing balance support: Bilateral upper extremity supported Standing balance-Leahy Scale: Poor Standing balance comment: needs external support from RW to stand.                             Cognition Arousal/Alertness: Awake/alert Behavior During Therapy: WFL for tasks assessed/performed Overall Cognitive Status: Within Functional Limits for tasks assessed  Exercises Total Joint Exercises Ankle Circles/Pumps: AROM;AAROM;Right;Left;20 reps Quad Sets: AROM;Left;10 reps Hip ABduction/ADduction: AAROM;Left;10 reps    General Comments        Pertinent Vitals/Pain Pain Assessment: 0-10 Pain Score: 5  Pain Location: LLE and stomach along incision  Pain Descriptors / Indicators: Aching;Contraction;Discomfort;Operative site guarding Pain Intervention(s): Limited activity within patient's tolerance;Monitored during session;Repositioned        PT Goals (current goals can now be found in the care plan  section) Acute Rehab PT Goals Patient Stated Goal: walk again  Progress towards PT goals: Progressing toward goals    Frequency    Min 5X/week      PT Plan Current plan remains appropriate       AM-PAC PT "6 Clicks" Daily Activity  Outcome Measure  Difficulty turning over in bed (including adjusting bedclothes, sheets and blankets)?: A Little Difficulty moving from lying on back to sitting on the side of the bed? : Unable Difficulty sitting down on and standing up from a chair with arms (e.g., wheelchair, bedside commode, etc,.)?: Unable Help needed moving to and from a bed to chair (including a wheelchair)?: A Little Help needed walking in hospital room?: Total Help needed climbing 3-5 steps with a railing? : Total 6 Click Score: 10    End of Session Equipment Utilized During Treatment: Left knee immobilizer;Other (comment)(left bledsoe brace) Activity Tolerance: Patient tolerated treatment well Patient left: in chair;with call bell/phone within reach;with family/visitor present   PT Visit Diagnosis: Unsteadiness on feet (R26.81);Other abnormalities of gait and mobility (R26.89);Pain;History of falling (Z91.81) Pain - Right/Left: Left Pain - part of body: Leg     Time: 7591-6384 PT Time Calculation (min) (ACUTE ONLY): 24 min  Charges:  $Therapeutic Activity: 23-37 mins          Kamauri Kathol B. Thurston, [place], DPT [phone]            01/25/2018, 3:51 PM

## 2018-01-25 NOTE — Progress Notes (Signed)
[facility] Progress Note  1 Day Post-Op  Subjective: CC: pain in LLE Patient reports cramping abdominal pain is slightly improved. Tolerating diet. Some soreness around R shoulder.  UOP good. VSS.   Objective: Vital signs in last 24 hours: Temp:  [97.7 F (36.5 C)-98.7 F (37.1 C)] 98.7 F (37.1 C) (03/21 0507) Pulse Rate:  [97-116] 105 (03/21 0507) Resp:  [24-32] 24 (03/20 1642) BP: (127-159)/(76-90) 159/86 (03/21 0507) SpO2:  [93 %-97 %] 96 % (03/21 0507) Weight:  [147.9 kg (326 lb)] 147.9 kg (326 lb) (03/20 1020) Last BM Date: 01/20/18  Intake/Output from previous day: 03/20 0701 - 03/21 0700 In: 3200 [I.V.:3200] Out: 1437 [Urine:1075; Drains:12; Blood:350] Intake/Output this shift: No intake/output data recorded.  PE: Gen: Alert, NAD, pleasant Card: Regular rate and rhythm, pedal pulses 2+ BL Pulm: Normal effort, clear to auscultation bilaterally R shoulder: incision c/d/i, no surrounding erythema or swelling Abd: Soft, non-tender, non-distended, bowel sounds present, no HSM, incisionwith provena VAC in place. L drain with minimal serosanguinous drainage. R drain with minimal sanguinous drainage and clot material in bulb and tubing. No surrounding erythema or discrete mass felt.  Ext: LLE in ace wrap Skin: warm and dry, no rashes  Psych: A&Ox3    Lab Results:  Recent Labs    01/24/18 0623 01/25/18 0641  WBC 11.5* 16.3*  HGB 9.5* 8.9*  HCT 31.2* 29.0*  PLT 459* 491*   BMET Recent Labs    01/23/18 0635 01/24/18 0623  NA  --  135  K  --  3.8  CL  --  99*  CO2  --  27  GLUCOSE  --  96  BUN  --  13  CREATININE 0.91 0.93  CALCIUM  --  8.2*   PT/INR Recent Labs    01/24/18 0623  LABPROT 14.7  INR 1.15   CMP     Component Value Date/Time   NA 135 01/24/2018 0623   K 3.8 01/24/2018 0623   CL 99 (L) 01/24/2018 0623   CO2 27 01/24/2018 0623   GLUCOSE 96 01/24/2018 0623   BUN 13 01/24/2018 0623   CREATININE 0.93 01/24/2018  0623   CALCIUM 8.2 (L) 01/24/2018 0623   PROT 6.2 (L) 01/24/2018 0623   ALBUMIN 2.4 (L) 01/24/2018 0623   AST 22 01/24/2018 0623   ALT 18 01/24/2018 0623   ALKPHOS 63 01/24/2018 0623   BILITOT 0.6 01/24/2018 0623   GFRNONAA >60 01/24/2018 0623   GFRAA >60 01/24/2018 0623   Lipase     Component Value Date/Time   LIPASE 19 12/23/2016 0818       Studies/Results: Dg Tibia/fibula Left  Result Date: 01/24/2018 CLINICAL DATA:  Fixation of fracture EXAM: DG C-ARM 61-120 MIN; LEFT TIBIA AND FIBULA - 2 VIEW COMPARISON:  CT 01/17/2018, radiograph 01/16/2017 FINDINGS: Nine low resolution intraoperative spot views of the left tibia and fibula. Total fluoroscopy time was 5 minutes 31 seconds. Images demonstrate surgical plate and multiple screw fixation of markedly comminuted proximal tibial fracture. A markedly comminuted proximal fibular fracture is also evident. IMPRESSION: Intraoperative fluoroscopic assistance provided during surgical fixation of proximal tibial fracture. Electronically Signed   By: Donavan Foil M.D.   On: 01/24/2018 14:52   Dg Knee Left Port  Result Date: 01/24/2018 CLINICAL DATA:  Status post ORIF of proximal left tibial fracture EXAM: PORTABLE LEFT KNEE - 1-2 VIEW COMPARISON:  Intra op films from earlier in the same day. FINDINGS: Fixation sideplate is noted along the lateral aspect  of the tibia. Multiple fixation screws are noted. Fracture fragments are stable in appearance when compared with the prior intraoperative exams. IMPRESSION: Status post ORIF of proximal left tibial fracture. Electronically Signed   By: Inez Catalina M.D.   On: 01/24/2018 16:30   Dg C-arm 1-60 Min  Result Date: 01/24/2018 CLINICAL DATA:  Fixation of fracture EXAM: DG C-ARM 61-120 MIN; LEFT TIBIA AND FIBULA - 2 VIEW COMPARISON:  CT 01/17/2018, radiograph 01/16/2017 FINDINGS: Nine low resolution intraoperative spot views of the left tibia and fibula. Total fluoroscopy time was 5 minutes 31 seconds.  Images demonstrate surgical plate and multiple screw fixation of markedly comminuted proximal tibial fracture. A markedly comminuted proximal fibular fracture is also evident. IMPRESSION: Intraoperative fluoroscopic assistance provided during surgical fixation of proximal tibial fracture. Electronically Signed   By: Donavan Foil M.D.   On: 01/24/2018 14:52   Dg C-arm 1-60 Min  Result Date: 01/24/2018 CLINICAL DATA:  Fixation of fracture EXAM: DG C-ARM 61-120 MIN; LEFT TIBIA AND FIBULA - 2 VIEW COMPARISON:  CT 01/17/2018, radiograph 01/16/2017 FINDINGS: Nine low resolution intraoperative spot views of the left tibia and fibula. Total fluoroscopy time was 5 minutes 31 seconds. Images demonstrate surgical plate and multiple screw fixation of markedly comminuted proximal tibial fracture. A markedly comminuted proximal fibular fracture is also evident. IMPRESSION: Intraoperative fluoroscopic assistance provided during surgical fixation of proximal tibial fracture. Electronically Signed   By: Donavan Foil M.D.   On: 01/24/2018 14:52   Dg C-arm 1-60 Min  Result Date: 01/24/2018 CLINICAL DATA:  Fixation of fracture EXAM: DG C-ARM 61-120 MIN; LEFT TIBIA AND FIBULA - 2 VIEW COMPARISON:  CT 01/17/2018, radiograph 01/16/2017 FINDINGS: Nine low resolution intraoperative spot views of the left tibia and fibula. Total fluoroscopy time was 5 minutes 31 seconds. Images demonstrate surgical plate and multiple screw fixation of markedly comminuted proximal tibial fracture. A markedly comminuted proximal fibular fracture is also evident. IMPRESSION: Intraoperative fluoroscopic assistance provided during surgical fixation of proximal tibial fracture. Electronically Signed   By: Donavan Foil M.D.   On: 01/24/2018 14:52    Anti-infectives: Anti-infectives (From admission, onward)   Start     Dose/Rate Route Frequency Ordered Stop   01/24/18 2300  ceFAZolin (ANCEF) IVPB 2g/100 mL premix     2 g 200 mL/hr over 30 Minutes  Intravenous Every 8 hours 01/24/18 1629 01/25/18 2159   01/24/18 1320  vancomycin (VANCOCIN) powder  Status:  Discontinued       As needed 01/24/18 1321 01/24/18 1525   01/24/18 1310  tobramycin (NEBCIN) powder  Status:  Discontinued       As needed 01/24/18 1311 01/24/18 1525   01/24/18 0800  ceFAZolin (ANCEF) 3 g in dextrose 5 % 50 mL IVPB     3 g 130 mL/hr over 30 Minutes Intravenous  Once 01/23/18 0921 01/24/18 1507   01/19/18 1800  ceFAZolin (ANCEF) 3 g in dextrose 5 % 50 mL IVPB     3 g 160 mL/hr over 30 Minutes Intravenous To [facility] 01/19/18 1720 01/20/18 1800   01/19/18 1200  ceFAZolin (ANCEF) 3 g in dextrose 5 % 50 mL IVPB     3 g 160 mL/hr over 30 Minutes Intravenous To [facility]Stay Surgical 01/19/18 1157 01/19/18 1202   01/17/18 0200  ceFAZolin (ANCEF) IVPB 2g/100 mL premix     2 g 200 mL/hr over 30 Minutes Intravenous Every 6 hours 01/16/18 2226 01/17/18 1430   01/16/18 1915  ceFAZolin (ANCEF) 3 g in  dextrose 5 % 50 mL IVPB    Note to Pharmacy:  Anesthesia to give preop   3 g 130 mL/hr over 30 Minutes Intravenous  Once 01/16/18 1859 01/16/18 1922       Assessment/Plan Tibial plateau fracture - s/p ORIF, per ortho  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 Georgette Dover - POD#6 - incision c/d/i - patient may cleanse gently with soap and warm water R abdominal wall soft tissue mass - s/p panniculectomy 01/19/18 Dr. Georgette Dover - POD#6 - provena VAC to be removed tomorrow, JP drains both removed today and covered with dry dressing - sutures/staples to remain at least 2 weeks ABL anemia - hgb 8.9 post-op,continue to monitor   FEN: reg diet VTE: SCD, lovenox 95 mg Q24h ID: Ancef 3/12, 3/13, 3/15-16;3/20  Will discontinue provena tomorrow, sutures/staples to remain for at least another week.     LOS: 9 days    Brigid Re , [facility] Surgery 01/25/2018, 8:23 AM Pager: [phone] Consults: [phone] Mon-Fri 7:00 am-4:30 pm Sat-Sun 7:00  am-11:30 am

## 2018-01-25 NOTE — Progress Notes (Signed)
Occupational Therapy Treatment Patient Details Name: Tyler Walters MRN: [id number] DOB: 03-14-1966 Today's Date: 01/25/2018    History of present illness Tyler Walters is a 52 y.o. male who sustained a left tibial plateau fracture s/p fall from step ladder and is currently in ex-fix awaiting surgical intervention this week. Pt also found to have growing abdominal mass that has been surgically removed 3/14 with wound vac placed.  Pt had ext fix removed and internalized on 01/24/18.    OT comments  Pt progressing towards OT goals. Education provided on AE for completing LB ADLs with pt return demonstrating with min verbal cues. Pt completing sit<>stand at RW with MinA (+2 for safety). Issued level 4 theraband with pt return demonstrating bil UE exercise across multiple planes. Pt remains motivated and willing to work/progress with therapy. Feel CIR recommendation remains appropriate at this time. Will continue to follow acutely to progress pt towards established OT goals.   Follow Up Recommendations  CIR    Equipment Recommendations  Other (comment);3 in 1 bedside commode;Tub/shower bench;Wheelchair (measurements OT);Wheelchair cushion (measurements OT)          Precautions / Restrictions Precautions Precautions: Fall Required Braces or Orthoses: Other Brace/Splint Other Brace/Splint: L knee bledsoe brace, locked in extension during mobility.  Restrictions Weight Bearing Restrictions: Yes LLE Weight Bearing: Non weight bearing       Mobility Bed Mobility Overal bed mobility: Needs Assistance Bed Mobility: Supine to Sit     Supine to sit: Min assist;HOB elevated     General bed mobility comments: OOB in recliner   Transfers Overall transfer level: Needs assistance Equipment used: Rolling walker (2 wheeled) Transfers: Sit to/from Stand Sit to Stand: Min assist;From elevated surface;+2 safety/equipment Stand pivot transfers: Min assist;From elevated surface       General  transfer comment: MinA with therapist providing support of LLE to maintain NWB and RN providing additional stand by assist as pt powers up from recliner and transitions UEs to RW, verbal cues for hand placement     Balance Overall balance assessment: Needs assistance Sitting-balance support: Feet supported;Single extremity supported Sitting balance-Leahy Scale: Fair Sitting balance - Comments: one hand supported on bed rail in sitting.    Standing balance support: Bilateral upper extremity supported Standing balance-Leahy Scale: Poor Standing balance comment: needs external support from RW to stand.                            ADL either performed or assessed with clinical judgement   ADL Overall ADL's : Needs assistance/impaired                                     Functional mobility during ADLs: Minimal assistance;Rolling walker(sit<>stand) General ADL Comments: education provded regarding AE for ADL completion for LB ADLs and for peri-care after toileting; pt return demonstrating use of reacher and sock aide with min verbal cues; issued level 4 theraband and instructed pt in bil UE HEP                        Cognition Arousal/Alertness: Awake/alert Behavior During Therapy: WFL for tasks assessed/performed Overall Cognitive Status: Within Functional Limits for tasks assessed  Exercises Exercises: Total Joint Total Joint Exercises Ankle Circles/Pumps: AROM;AAROM;Right;Left;20 reps Quad Sets: AROM;Left;10 reps Hip ABduction/ADduction: AAROM;Left;10 reps General Exercises - Upper Extremity Shoulder Flexion: AROM;10 reps;Theraband;Seated;Both Theraband Level (Shoulder Flexion): Level 4 (Blue) Shoulder ABduction: AROM;10 reps;Theraband;Seated;Both Theraband Level (Shoulder Abduction): Level 4 (Blue) Shoulder ADduction: AROM;10 reps;Theraband;Seated;Both Theraband Level (Shoulder Adduction):  Level 4 (Blue) Shoulder Horizontal ABduction: AROM;10 reps;Seated;Theraband Theraband Level (Shoulder Horizontal Abduction): Level 4 (Blue) Shoulder Horizontal ADduction: AROM;10 reps;Both;Seated;Theraband Theraband Level (Shoulder Horizontal Adduction): Level 4 (Blue) Elbow Flexion: AROM;10 reps;Both;Theraband;Seated Theraband Level (Elbow Flexion): Level 4 (Blue) Elbow Extension: AROM;10 reps;Seated;Both;Theraband Theraband Level (Elbow Extension): Level 4 (Blue)                Pertinent Vitals/ Pain       Pain Assessment: Faces Pain Score: 5  Faces Pain Scale: Hurts even more Pain Location: LLE and stomach along incision  Pain Descriptors / Indicators: Aching;Contraction;Discomfort;Operative site guarding Pain Intervention(s): Monitored during session;Repositioned                                                          Frequency  Min 2X/week        Progress Toward Goals  OT Goals(current goals can now be found in the care plan section)  Progress towards OT goals: Progressing toward goals  Acute Rehab OT Goals Patient Stated Goal: walk again  OT Goal Formulation: With patient Time For Goal Achievement: 02/03/18 Potential to Achieve Goals: Good  Plan Discharge plan remains appropriate                    AM-PAC PT "6 Clicks" Daily Activity     Outcome Measure   Help from another person eating meals?: None Help from another person taking care of personal grooming?: A Little Help from another person toileting, which includes using toliet, bedpan, or urinal?: A Little Help from another person bathing (including washing, rinsing, drying)?: A Little Help from another person to put on and taking off regular upper body clothing?: A Little Help from another person to put on and taking off regular lower body clothing?: A Lot 6 Click Score: 18    End of Session Equipment Utilized During Treatment: Rolling walker  OT Visit Diagnosis:  Unsteadiness on feet (R26.81);Other abnormalities of gait and mobility (R26.89);Muscle weakness (generalized) (M62.81);Pain Pain - Right/Left: Left Pain - part of body: Leg   Activity Tolerance Patient tolerated treatment well   Patient Left in chair;with call bell/phone within reach;with family/visitor present   Nurse Communication Mobility status        Time: 1610-9604 OT Time Calculation (min): 28 min  Charges: OT General Charges $OT Visit: 1 Visit OT Treatments $Self Care/Home Management : 8-22 mins $Therapeutic Exercise: 8-22 mins  Lou Cal, OT Pager [phone] 01/25/2018    Raymondo Band 01/25/2018, 5:01 PM

## 2018-01-25 NOTE — Progress Notes (Signed)
Orthopaedic Trauma Progress Note  S: Having a lot of pain this AM, not able to move leg much. States pain medication does not help significantly  O: NAD, AAOx3 LLE: compartments soft and compressible. No pain with passive stretch. Dressing clean, dry and intact. Flexion only to 15-20 deg. Active DF/PF. Sensation grossly intact. Warm and well perfused foot. . Labs:  CBC    Component Value Date/Time   WBC 16.3 (H) 01/25/2018 0641   RBC 3.66 (L) 01/25/2018 0641   HGB 8.9 (L) 01/25/2018 0641   HCT 29.0 (L) 01/25/2018 0641   PLT 491 (H) 01/25/2018 0641   MCV 79.2 01/25/2018 0641   MCH 24.3 (L) 01/25/2018 0641   MCHC 30.7 01/25/2018 0641   RDW 16.1 (H) 01/25/2018 0641   LYMPHSABS 1.4 01/16/2018 1813   MONOABS 0.9 01/16/2018 1813   EOSABS 0.0 01/16/2018 1813   BASOSABS 0.0 01/16/2018 4226   A/P: 52 year old male s/p ORIF of left tibial plateau fracture  -NWB LLE -Pain control, added toradol to regiment -PT/OT -Abdomen per general surgery -Hinged knee brace today -Plan for discharge to CIR  Shona Needles, MD [facility] [phone] (phone)

## 2018-01-26 ENCOUNTER — Inpatient Hospital Stay (HOSPITAL_COMMUNITY)
Admission: RE | Admit: 2018-01-26 | Discharge: 2018-02-03 | DRG: 560 | Disposition: A | Discharge status: Home / Self Care | Payer: Medicaid Other | Source: Intra-hospital | Attending: Physical Medicine & Rehabilitation | Admitting: Physical Medicine & Rehabilitation

## 2018-01-26 ENCOUNTER — Encounter (HOSPITAL_COMMUNITY): Payer: Self-pay | Admitting: Emergency Medicine

## 2018-01-26 ENCOUNTER — Encounter (HOSPITAL_COMMUNITY): Payer: Self-pay | Admitting: Physical Medicine and Rehabilitation

## 2018-01-26 ENCOUNTER — Other Ambulatory Visit: Payer: Self-pay

## 2018-01-26 DIAGNOSIS — L24A9 Irritant contact dermatitis due friction or contact with other specified body fluids: Secondary | ICD-10-CM

## 2018-01-26 DIAGNOSIS — T40605A Adverse effect of unspecified narcotics, initial encounter: Secondary | ICD-10-CM | POA: Diagnosis present

## 2018-01-26 DIAGNOSIS — S82142D Displaced bicondylar fracture of left tibia, subsequent encounter for closed fracture with routine healing: Secondary | ICD-10-CM | POA: Diagnosis present

## 2018-01-26 DIAGNOSIS — K5903 Drug induced constipation: Secondary | ICD-10-CM | POA: Diagnosis present

## 2018-01-26 DIAGNOSIS — Z833 Family history of diabetes mellitus: Secondary | ICD-10-CM

## 2018-01-26 DIAGNOSIS — Z6841 Body Mass Index (BMI) 40.0 and over, adult: Secondary | ICD-10-CM

## 2018-01-26 DIAGNOSIS — D62 Acute posthemorrhagic anemia: Secondary | ICD-10-CM | POA: Diagnosis present

## 2018-01-26 DIAGNOSIS — E65 Localized adiposity: Secondary | ICD-10-CM | POA: Diagnosis present

## 2018-01-26 DIAGNOSIS — R600 Localized edema: Secondary | ICD-10-CM | POA: Diagnosis present

## 2018-01-26 DIAGNOSIS — W11XXXD Fall on and from ladder, subsequent encounter: Secondary | ICD-10-CM | POA: Diagnosis present

## 2018-01-26 DIAGNOSIS — S82142A Displaced bicondylar fracture of left tibia, initial encounter for closed fracture: Secondary | ICD-10-CM | POA: Diagnosis present

## 2018-01-26 DIAGNOSIS — D171 Benign lipomatous neoplasm of skin and subcutaneous tissue of trunk: Secondary | ICD-10-CM | POA: Diagnosis present

## 2018-01-26 DIAGNOSIS — D72828 Other elevated white blood cell count: Secondary | ICD-10-CM | POA: Diagnosis present

## 2018-01-26 DIAGNOSIS — S82132S Displaced fracture of medial condyle of left tibia, sequela: Secondary | ICD-10-CM

## 2018-01-26 DIAGNOSIS — T148XXA Other injury of unspecified body region, initial encounter: Secondary | ICD-10-CM

## 2018-01-26 LAB — CBC
HEMATOCRIT: 28.3 % — AB (ref 39.0–52.0)
Hemoglobin: 8.5 g/dL — ABNORMAL LOW (ref 13.0–17.0)
MCH: 23.9 pg — ABNORMAL LOW (ref 26.0–34.0)
MCHC: 30 g/dL (ref 30.0–36.0)
MCV: 79.7 fL (ref 78.0–100.0)
Platelets: 484 10*3/uL — ABNORMAL HIGH (ref 150–400)
RBC: 3.55 MIL/uL — AB (ref 4.22–5.81)
RDW: 16.3 % — ABNORMAL HIGH (ref 11.5–15.5)
WBC: 11.3 10*3/uL — ABNORMAL HIGH (ref 4.0–10.5)

## 2018-01-26 MED ORDER — METHOCARBAMOL 750 MG PO TABS
750.0000 mg | ORAL_TABLET | Freq: Four times a day (QID) | ORAL | Status: DC | PRN
  Administered 2018-01-27 – 2018-02-02 (×5): 750 mg via ORAL
  Filled 2018-01-26 (×5): qty 1

## 2018-01-26 MED ORDER — FLEET ENEMA 7-19 GM/118ML RE ENEM: 1.0000 | ENEMA | Freq: Once | RECTAL | Status: DC | PRN

## 2018-01-26 MED ORDER — POLYETHYLENE GLYCOL 3350 17 G PO PACK
17.0000 g | PACK | Freq: Two times a day (BID) | ORAL | Status: DC
  Administered 2018-01-28 – 2018-02-01 (×9): 17 g via ORAL
  Filled 2018-01-26 (×13): qty 1

## 2018-01-26 MED ORDER — TRAZODONE HCL 50 MG PO TABS: 25.0000 mg | ORAL_TABLET | Freq: Every evening | ORAL | Status: DC | PRN

## 2018-01-26 MED ORDER — DIPHENHYDRAMINE HCL 12.5 MG/5ML PO ELIX: 12.5000 mg | ORAL_SOLUTION | Freq: Four times a day (QID) | ORAL | Status: DC | PRN

## 2018-01-26 MED ORDER — GUAIFENESIN-DM 100-10 MG/5ML PO SYRP: 5.0000 mL | ORAL_SOLUTION | Freq: Four times a day (QID) | ORAL | Status: DC | PRN

## 2018-01-26 MED ORDER — OXYCODONE HCL 5 MG PO TABS
5.0000 mg | ORAL_TABLET | ORAL | Status: DC | PRN
  Administered 2018-01-26 – 2018-01-29 (×8): 10 mg via ORAL
  Administered 2018-01-30: 5 mg via ORAL
  Administered 2018-01-30 – 2018-01-31 (×2): 10 mg via ORAL
  Filled 2018-01-26 (×9): qty 2
  Filled 2018-01-26: qty 1
  Filled 2018-01-26: qty 2

## 2018-01-26 MED ORDER — TRAMADOL HCL 50 MG PO TABS
50.0000 mg | ORAL_TABLET | Freq: Four times a day (QID) | ORAL | Status: DC | PRN
  Administered 2018-01-27 – 2018-01-28 (×2): 50 mg via ORAL
  Filled 2018-01-26 (×2): qty 1

## 2018-01-26 MED ORDER — KETOROLAC TROMETHAMINE 15 MG/ML IJ SOLN
15.0000 mg | Freq: Four times a day (QID) | INTRAMUSCULAR | Status: AC
  Administered 2018-01-27 – 2018-01-30 (×13): 15 mg via INTRAVENOUS
  Filled 2018-01-26 (×14): qty 1

## 2018-01-26 MED ORDER — TAMSULOSIN HCL 0.4 MG PO CAPS
0.4000 mg | ORAL_CAPSULE | Freq: Every day | ORAL | Status: DC
  Administered 2018-01-27 – 2018-02-02 (×7): 0.4 mg via ORAL
  Filled 2018-01-26 (×7): qty 1

## 2018-01-26 MED ORDER — ENOXAPARIN SODIUM 80 MG/0.8ML ~~LOC~~ SOLN
70.0000 mg | SUBCUTANEOUS | Status: DC
  Administered 2018-01-27: 70 mg via SUBCUTANEOUS
  Filled 2018-01-26: qty 0.7

## 2018-01-26 MED ORDER — POLYETHYLENE GLYCOL 3350 17 G PO PACK
17.0000 g | PACK | Freq: Every day | ORAL | Status: DC | PRN
  Administered 2018-01-28: 17 g via ORAL
  Filled 2018-01-26: qty 1

## 2018-01-26 MED ORDER — PROCHLORPERAZINE EDISYLATE 5 MG/ML IJ SOLN: 5.0000 mg | Freq: Four times a day (QID) | INTRAMUSCULAR | Status: DC | PRN

## 2018-01-26 MED ORDER — ACETAMINOPHEN 325 MG PO TABS: 325.0000 mg | ORAL_TABLET | ORAL | Status: DC | PRN

## 2018-01-26 MED ORDER — DIPHENHYDRAMINE HCL 12.5 MG/5ML PO ELIX: 25.0000 mg | ORAL_SOLUTION | ORAL | Status: DC | PRN

## 2018-01-26 MED ORDER — ALUM & MAG HYDROXIDE-SIMETH 200-200-20 MG/5ML PO SUSP
30.0000 mL | ORAL | Status: DC | PRN
  Administered 2018-02-02: 30 mL via ORAL
  Filled 2018-01-26: qty 30

## 2018-01-26 MED ORDER — PROCHLORPERAZINE MALEATE 5 MG PO TABS: 5.0000 mg | ORAL_TABLET | Freq: Four times a day (QID) | ORAL | Status: DC | PRN

## 2018-01-26 MED ORDER — BISACODYL 10 MG RE SUPP: 10.0000 mg | Freq: Every day | RECTAL | Status: DC | PRN

## 2018-01-26 MED ORDER — PROCHLORPERAZINE 25 MG RE SUPP: 12.5000 mg | Freq: Four times a day (QID) | RECTAL | Status: DC | PRN

## 2018-01-26 NOTE — Discharge Instructions (Signed)
Orthopaedic Trauma Service Discharge Instructions   General Discharge Instructions  WEIGHT BEARING STATUS: Nonweightbearing left leg   RANGE OF MOTION/ACTIVITY: unrestricted range of motion left knee. No pillows under bend of knee at rest. Keep knee either in full extension at rest or 90 degrees if in chair.  Hinged brace on and unlocked during the day and locked in full extension at night   Wound Care: daily wound care starting 01/26/2018. See below  Discharge Wound Care Instructions  Do NOT apply any ointments, solutions or lotions to pin sites or surgical wounds.  These prevent needed drainage and even though solutions like hydrogen peroxide kill bacteria, they also damage cells lining the pin sites that help fight infection.  Applying lotions or ointments can keep the wounds moist and can cause them to breakdown and open up as well. This can increase the risk for infection. When in doubt call the office.  Surgical incisions should be dressed daily.  If any drainage is noted, use one layer of adaptic, then gauze, Kerlix, and an ace wrap.  Once the incision is completely dry and without drainage, it may be left open to air out.  Showering may begin 36-48 hours later.  Cleaning gently with soap and water.  Traumatic wounds should be dressed daily as well.    One layer of adaptic, gauze, Kerlix, then ace wrap.  The adaptic can be discontinued once the draining has ceased    If you have a wet to dry dressing: wet the gauze with saline the squeeze as much saline out so the gauze is moist (not soaking wet), place moistened gauze over wound, then place a dry gauze over the moist one, followed by Kerlix wrap, then ace wrap.   Diet: as you were eating previously.  Can use over the counter stool softeners and bowel preparations, such as Miralax, to help with bowel movements.  Narcotics can be constipating.  Be sure to drink plenty of fluids  PAIN MEDICATION USE AND EXPECTATIONS  You have likely  been given narcotic medications to help control your pain.  After a traumatic event that results in an fracture (broken bone) with or without surgery, it is ok to use narcotic pain medications to help control one's pain.  We understand that everyone responds to pain differently and each individual patient will be evaluated on a regular basis for the continued need for narcotic medications. Ideally, narcotic medication use should last no more than 6-8 weeks (coinciding with fracture healing).   As a patient it is your responsibility as well to monitor narcotic medication use and report the amount and frequency you use these medications when you come to your office visit.   We would also advise that if you are using narcotic medications, you should take a dose prior to therapy to maximize you participation.  IF YOU ARE ON NARCOTIC MEDICATIONS IT IS NOT PERMISSIBLE TO OPERATE A MOTOR VEHICLE (MOTORCYCLE/CAR/TRUCK/MOPED) OR HEAVY MACHINERY DO NOT MIX NARCOTICS WITH OTHER CNS (CENTRAL NERVOUS SYSTEM) DEPRESSANTS SUCH AS ALCOHOL   STOP SMOKING OR USING NICOTINE PRODUCTS!!!!  As discussed nicotine severely impairs your body's ability to heal surgical and traumatic wounds but also impairs bone healing.  Wounds and bone heal by forming microscopic blood vessels (angiogenesis) and nicotine is a vasoconstrictor (essentially, shrinks blood vessels).  Therefore, if vasoconstriction occurs to these microscopic blood vessels they essentially disappear and are unable to deliver necessary nutrients to the healing tissue.  This is one modifiable factor that you  can do to dramatically increase your chances of healing your injury.    (This means no smoking, no nicotine gum, patches, etc)  DO NOT USE NONSTEROIDAL ANTI-INFLAMMATORY DRUGS (NSAID'S)  Using products such as Advil (ibuprofen), Aleve (naproxen), Motrin (ibuprofen) for additional pain control during fracture healing can delay and/or prevent the healing response.   If you would like to take over the counter (OTC) medication, Tylenol (acetaminophen) is ok.  However, some narcotic medications that are given for pain control contain acetaminophen as well. Therefore, you should not exceed more than 4000 mg of tylenol in a day if you do not have liver disease.  Also note that there are may OTC medicines, such as cold medicines and allergy medicines that my contain tylenol as well.  If you have any questions about medications and/or interactions please ask your doctor/PA or your pharmacist.      ICE AND ELEVATE INJURED/OPERATIVE EXTREMITY  Using ice and elevating the injured extremity above your heart can help with swelling and pain control.  Icing in a pulsatile fashion, such as 20 minutes on and 20 minutes off, can be followed.    Do not place ice directly on skin. Make sure there is a barrier between to skin and the ice pack.    Using frozen items such as frozen peas works well as the conform nicely to the are that needs to be iced.  USE AN ACE WRAP OR TED HOSE FOR SWELLING CONTROL  In addition to icing and elevation, Ace wraps or TED hose are used to help limit and resolve swelling.  It is recommended to use Ace wraps or TED hose until you are informed to stop.    When using Ace Wraps start the wrapping distally (farthest away from the body) and wrap proximally (closer to the body)   Example: If you had surgery on your leg or thing and you do not have a splint on, start the ace wrap at the toes and work your way up to the thigh        If you had surgery on your upper extremity and do not have a splint on, start the ace wrap at your fingers and work your way up to the upper arm  IF YOU ARE IN A SPLINT OR CAST DO NOT Mark   If your splint gets wet for any reason please contact the office immediately. You may shower in your splint or cast as long as you keep it dry.  This can be done by wrapping in a cast cover or garbage back (or similar)  Do  Not stick any thing down your splint or cast such as pencils, money, or hangers to try and scratch yourself with.  If you feel itchy take benadryl as prescribed on the bottle for itching  IF YOU ARE IN A CAM BOOT (BLACK BOOT)  You may remove boot periodically. Perform daily dressing changes as noted below.  Wash the liner of the boot regularly and wear a sock when wearing the boot. It is recommended that you sleep in the boot until told otherwise  CALL THE OFFICE WITH ANY QUESTIONS OR CONCERNS: [phone]

## 2018-01-26 NOTE — Progress Notes (Signed)
I met with pt and his Mom at bedside. We discussed goals and expectations of an inpt rehab admit. He is in agreement to admit. I weill make the arrangements to admit today. RN CM made [phone] 

## 2018-01-26 NOTE — Progress Notes (Signed)
All questions and concerns addressed, Pt not in distress, gave report to Denise,RN, Pt discharged to 4W Rm#3.

## 2018-01-26 NOTE — Progress Notes (Signed)
Pt A/O, no noted distress. Pain 3/10. LLQ 0.2 CM, RLQ 0.2 cm, Abd surgical lateral 78 cm, LLE sutures, RUE/LUE bruising, and Rt upper chest steri-strips. Pt requested social worker Photographer). No other skin issues. Wt 406 lb, without the machine which there is a 25% difference in weight. Pt stated "the other unit had a hard time getting the bed to zero out." Pt noted he fell from a ladder. Pt spouse at bed side. Staff will maintain safety and monitor and meet needs.

## 2018-01-26 NOTE — Progress Notes (Signed)
Physical Therapy Treatment Patient Details Name: Tyler Walters MRN: [id number] DOB: Apr 05, 1966 Today's Date: 01/26/2018    History of Present Illness CRISTOFHER Walters is a 52 y.o. male who sustained a left tibial plateau fracture s/p fall from step ladder and is currently in ex-fix awaiting surgical intervention this week. Pt also found to have growing abdominal mass that has been surgically removed 3/14 with wound vac placed.  Pt had ext fix removed and internalized on 01/24/18.     PT Comments    Pt progressing well with mobility. Min assist required for bed mobility and transfers. Min guard assist needed for ambulation 5 feet with RW. Pt able to maintain NWB LLE throughout session. Goals updated. PT to continue per POC.     Follow Up Recommendations  CIR;Supervision/Assistance - 24 hour     Equipment Recommendations  Rolling walker with 5" wheels;3in1 (PT);Wheelchair (measurements PT);Wheelchair cushion (measurements PT)(20x20, maybe even 22x22)    Recommendations for Other Services       Precautions / Restrictions Precautions Precautions: Fall Required Braces or Orthoses: Other Brace/Splint Other Brace/Splint: L knee bledsoe brace, locked in extension during mobility.  Restrictions LLE Weight Bearing: Non weight bearing    Mobility  Bed Mobility         Supine to sit: Min assist;HOB elevated     General bed mobility comments: +rail, assist with LLE  Transfers   Equipment used: Rolling walker (2 wheeled)   Sit to Stand: Min assist;From elevated surface Stand pivot transfers: Min assist       General transfer comment: Increased time to stabilize initial standing balance.  Ambulation/Gait Ambulation/Gait assistance: Min guard Ambulation Distance (Feet): 5 Feet Assistive device: Rolling walker (2 wheeled) Gait Pattern/deviations: Step-to pattern Gait velocity: decreased Gait velocity interpretation: Below normal speed for age/gender General Gait Details: Pt  able to maintain NWB LLE for ambulation 5 feet bed to recliner.   Stairs            Wheelchair Mobility    Modified Rankin (Stroke Patients Only)       Balance   Sitting-balance support: Feet supported;Single extremity supported Sitting balance-Leahy Scale: Fair Sitting balance - Comments: one hand supported on bed rail in sitting.    Standing balance support: Bilateral upper extremity supported;During functional activity Standing balance-Leahy Scale: Poor Standing balance comment: Static stand x 2 minutes with RW min guard assist. Pt able to maintain LLE NWB.                             Cognition Arousal/Alertness: Awake/alert Behavior During Therapy: WFL for tasks assessed/performed Overall Cognitive Status: Within Functional Limits for tasks assessed                                        Exercises      General Comments        Pertinent Vitals/Pain Pain Assessment: 0-10 Pain Score: 3  Pain Location: LLE and stomach along incision  Pain Descriptors / Indicators: Sore;Discomfort Pain Intervention(s): Monitored during session;Repositioned    Home Living                      Prior Function            PT Goals (current goals can now be found in the care plan section) Acute Rehab PT  Goals Patient Stated Goal: walk again  PT Goal Formulation: With patient Time For Goal Achievement: 02/02/18 Potential to Achieve Goals: Good Progress towards PT goals: Progressing toward goals    Frequency    Min 5X/week      PT Plan Current plan remains appropriate    Co-evaluation              AM-PAC PT "6 Clicks" Daily Activity  Outcome Measure  Difficulty turning over in bed (including adjusting bedclothes, sheets and blankets)?: A Little Difficulty moving from lying on back to sitting on the side of the bed? : A Lot Difficulty sitting down on and standing up from a chair with arms (e.g., wheelchair, bedside  commode, etc,.)?: A Lot Help needed moving to and from a bed to chair (including a wheelchair)?: A Little Help needed walking in hospital room?: A Little Help needed climbing 3-5 steps with a railing? : Total 6 Click Score: 14    End of Session Equipment Utilized During Treatment: Other (comment)(L bledsoe brace locked in extension) Activity Tolerance: Patient tolerated treatment well Patient left: in chair;with call bell/phone within reach Nurse Communication: Mobility status PT Visit Diagnosis: Unsteadiness on feet (R26.81);Other abnormalities of gait and mobility (R26.89);Pain;History of falling (Z91.81) Pain - Right/Left: Left Pain - part of body: Leg     Time: 0037-0488 PT Time Calculation (min) (ACUTE ONLY): 18 min  Charges:  $Therapeutic Activity: 8-22 mins                    G Codes:       Lorrin Goodell, PT  Office # [phone] Pager [phone]    Lorriane Shire 01/26/2018, 10:29 AM

## 2018-01-26 NOTE — Progress Notes (Signed)
Orthopedic Trauma Service Progress Note   Patient ID: Tyler Walters MRN: [id number] DOB/AGE: December 01, 1965 52 y.o.  Subjective:  Patient reports pain as mild.    [name] states he is feeling well this morning with no acute complaints. States he has been working with PT and completing exercises and stretches without significant pain or difficulty. States he was able to sit in the chair for most of the day yesterday. No difficulty breathing, fever, chills, painful urination.  Pain control much better with Toradol    ROS Per HPI  Objective:   VITALS:   Vitals:   01/25/18 0042 01/25/18 0507 01/25/18 2000 01/26/18 0450  BP: 138/76 (!) 159/86 135/87 (!) 143/93  Pulse: (!) 116 (!) 105 (!) 102 (!) 109  Resp:      Temp: 98.6 F (37 C) 98.7 F (37.1 C) 98.9 F (37.2 C) 98.8 F (37.1 C)  TempSrc: Oral Oral Oral Oral  SpO2: 95% 96% 98% 95%  Weight:      Height:        Estimated body mass index is 49.57 kg/m as calculated from the following:   Height as of this encounter: 5\' 8"  (1.727 m).   Weight as of this encounter: 147.9 kg (326 lb).   Intake/Output      03/21 0701 - 03/22 0700 03/22 0701 - 03/23 0700   P.O. 480    I.V. (mL/kg) 16542.6 (111.8)    IV Piggyback 300    Total Intake(mL/kg) 17322.6 (117.1)    Urine (mL/kg/hr) 300 (0.1) 325 (1)   Drains 5    Blood     Total Output 305 325   Net +17017.6 -325        Urine Occurrence 800 x      LABS  Results for DEMARCUS, THIELKE (MRN [id number]) as of 01/26/2018 09:46  Ref. Range 01/26/2018 08:56  WBC Latest Ref Range: 4.0 - 10.5 K/uL 11.3 (H)  RBC Latest Ref Range: 4.22 - 5.81 MIL/uL 3.55 (L)  Hemoglobin Latest Ref Range: 13.0 - 17.0 g/dL 8.5 (L)  HCT Latest Ref Range: 39.0 - 52.0 % 28.3 (L)  MCV Latest Ref Range: 78.0 - 100.0 fL 79.7  MCH Latest Ref Range: 26.0 - 34.0 pg 23.9 (L)  MCHC Latest Ref Range: 30.0 - 36.0 g/dL 30.0  RDW Latest Ref Range: 11.5 - 15.5 % 16.3 (H)  Platelets Latest Ref  Range: 150 - 400 K/uL 484 (H)    PHYSICAL EXAM:   Gen: Resting comfortably in bed. Lungs: Clear to auscultation bilaterally Cardiac: Regular rhythm. Abd: bowel sounds normoactive. Lower abdominal incision clean and dry Ext:      Left Lower Extremity    Sensation grossly intact   Foot warm and well perfused.   PF, DF, ankle rotation intact   Flexion, extension of digits intact   No DCT   Compartments soft   Dressing clean, dry, intact   Dressing removed   All wounds look good,incisions healing well. No drainage, no signs of infection    Ex fix pinsites are stable, no drainage      Assessment/Plan: 2 Days Post-Op   Principal Problem:   Closed bicondylar fracture of left tibial plateau Active Problems:   Pannus, abdominal   Anti-infectives (From admission, onward)   Start     Dose/Rate Route Frequency Ordered Stop   01/24/18 2300  ceFAZolin (ANCEF) IVPB 2g/100 mL premix     2 g 200 mL/hr over 30 Minutes Intravenous Every 8 hours 01/24/18  1629 01/25/18 1433   01/24/18 1320  vancomycin (VANCOCIN) powder  Status:  Discontinued       As needed 01/24/18 1321 01/24/18 1525   01/24/18 1310  tobramycin (NEBCIN) powder  Status:  Discontinued       As needed 01/24/18 1311 01/24/18 1525   01/24/18 0800  ceFAZolin (ANCEF) 3 g in dextrose 5 % 50 mL IVPB     3 g 130 mL/hr over 30 Minutes Intravenous  Once 01/23/18 0921 01/24/18 1507   01/19/18 1800  ceFAZolin (ANCEF) 3 g in dextrose 5 % 50 mL IVPB     3 g 160 mL/hr over 30 Minutes Intravenous To [facility]Stay [facility] 01/19/18 1720 01/20/18 1800   01/19/18 1200  ceFAZolin (ANCEF) 3 g in dextrose 5 % 50 mL IVPB     3 g 160 mL/hr over 30 Minutes Intravenous To ShortStay Surgical 01/19/18 1157 01/19/18 1202   01/17/18 0200  ceFAZolin (ANCEF) IVPB 2g/100 mL premix     2 g 200 mL/hr over 30 Minutes Intravenous Every 6 hours 01/16/18 2226 01/17/18 1430   01/16/18 1915  ceFAZolin (ANCEF) 3 g in dextrose 5 % 50 mL IVPB    Note to Pharmacy:   Anesthesia to give preop   3 g 130 mL/hr over 30 Minutes Intravenous  Once 01/16/18 1859 01/16/18 1922    .  POD#: 2  52 yo male s/p ORIF of left tibial plateau fracture  - L bicondylar tibial plateau fracture s/p ORIF  NWB x 8 weeks  Unrestricted ROM L knee  Do not let knee rest in flexion, use pillows under ankle or zero knee bone foam to maintain full extension at rest.  Can also sit in chair with knees bent to 90 degrees as well   Lock brace in full extension at night and unlock during the day    PT/OT  Ice PRN   Dressing changes as needed  - Pain management:  Dilaudid  toradol   - ABL anemia/Hemodynamics  Continue to monitor   - Medical issues   Abdomen per general surgery  - DVT/PE prophylaxis:  lovenox SQ - weight based dose  SCD  Recommend lovenox for another 21 days   - ID:  periop abx given  - Activity:  NWB LLE  - FEN/GI prophylaxis/Foley/Lines:  Reg diet  -Ex-fix/Splint care:  Hinged knee brace  - Dispo:  Continue PT/OT  Plan for discharge to CIR  CIR consulted    Tonny Branch, PA-S2 01/26/2018, 9:16 AM   I have seen and evaluated the Pt with PA student Hunt. I agree with the above note. We performed dressing change together  Formal consult to CIR made Pt can DC to CIR when bed available if he is deemed an appropriate candidate   Jari Pigg, PA-C [facility] [phone] (C) [phone] (O) 01/26/2018 9:53 AM

## 2018-01-26 NOTE — Progress Notes (Signed)
Cristina Gong, RN  Rehab Admission Coordinator  Physical Medicine and Rehabilitation  PMR Pre-admission  Signed  Date of Service:  01/26/2018 1:54 PM       Related encounter: ED to Hosp-Admission (Current) from 01/16/2018 in [place]           [] Hide copied text  [] Hover for details   PMR Admission Coordinator Pre-Admission Assessment  Patient: Tyler Walters is an 52 y.o., male MRN: [id number] DOB: 11-16-1965 Height: 5\' 8"  (172.7 cm) Weight: (!) 147.9 kg (326 lb)                                                                                                                                                  Insurance Information  PRIMARY: uninsured       Patient has applied for disability and Medicaid since 2014 and denied. He has a Sports coach firm, [employer] law firm, handling his case. He also has a case worker at [employer] Application Date:       Case Manager:  Disability Application Date:       Case Worker:   Emergency [place]    Name [employer]   [place] [facility] [phone]     Current Medical History  Patient Admitting Diagnosis: debility with left tibial plateau fracture after a fall  History of Present Illness:  HPI:Tyler A Sellarsis a 52 y.o.malewho was admitted on 01/16/18 after a fall of step ladder with subsequent left tibial plateau fracture. He was taken to OR for placement of external fixator for stabilization. He was evaluated by Dr. Marcelino Scot who recommended fixation once edema improved. General Surgery consulted for input on growing abdominal mass which was felt to be likely a massive lipoma. He was taken to OR on 3/15 for excision of right chest wall and right abdominal mass with panniculectomy by Dr. Georgette Dover. He was taken to OR for ORIF left bicondylar tibial plateau, Left tibial shaft and repair  of lateral meniscus by Dr. Doreatha Martin. Post op to be NWB LLE with hinged brace in place--brace to be locked in extension at nights and unlocked during the day . He has had issues with pain control and Toradol was added yesterday with improvement in symptoms.    Past Medical History      Past Medical History:  Diagnosis Date  . Lipoma of abdominal wall    Large lipoma    Family History  family history is not on file.  Prior Rehab/Hospitalizations:  Has the patient had major surgery during 100 days prior to admission? No  Current Medications   Current Facility-Administered Medications:  .  0.9 %  sodium chloride infusion, , Intravenous, Continuous, Xu, Naiping M,  MD, Last Rate: 125 mL/hr at 01/24/18 2156 .  acetaminophen (TYLENOL) tablet 325-650 mg, 325-650 mg, Oral, Q6H PRN, Leandrew Koyanagi, MD, 650 mg at 01/25/18 0605 .  diphenhydrAMINE (BENADRYL) 12.5 MG/5ML elixir 25 mg, 25 mg, Oral, Q4H PRN, Leandrew Koyanagi, MD .  docusate sodium (COLACE) capsule 100 mg, 100 mg, Oral, BID, Leandrew Koyanagi, MD, 100 mg at 01/26/18 0954 .  enoxaparin (LOVENOX) injection 70 mg, 70 mg, Subcutaneous, Q24H, Altamese , MD, 70 mg at 01/26/18 0956 .  HYDROmorphone (DILAUDID) injection 1 mg, 1 mg, Intravenous, Q2H PRN, Leandrew Koyanagi, MD, 1 mg at 01/25/18 0400 .  ketorolac (TORADOL) 15 MG/ML injection 15 mg, 15 mg, Intravenous, Q6H, Haddix, Thomasene Lot, MD, 15 mg at 01/26/18 1158 .  lactated ringers infusion, , Intravenous, Continuous, Josephine Igo, MD, Last Rate: 10 mL/hr at 01/24/18 1036 .  magnesium citrate solution 1 Bottle, 1 Bottle, Oral, Once PRN, Leandrew Koyanagi, MD .  methocarbamol (ROBAXIN) tablet 750 mg, 750 mg, Oral, Q6H PRN, 750 mg at 01/26/18 0954 **OR** methocarbamol (ROBAXIN) 750 mg in dextrose 5 % 50 mL IVPB, 750 mg, Intravenous, Q6H PRN, [place], Kelly A, PA-C .  metoCLOPramide (REGLAN) tablet 5-10 mg, 5-10 mg, Oral, Q8H PRN **OR** metoCLOPramide (REGLAN) injection 5-10 mg, 5-10 mg, Intravenous,  Q8H PRN, Leandrew Koyanagi, MD, 10 mg at 01/24/18 2154 .  morphine 2 MG/ML injection 0.5-1 mg, 0.5-1 mg, Intravenous, Q2H PRN, Leandrew Koyanagi, MD, 1 mg at 01/19/18 2130 .  ondansetron (ZOFRAN) tablet 4 mg, 4 mg, Oral, Q6H PRN, 4 mg at 01/23/18 1110 **OR** ondansetron (ZOFRAN) injection 4 mg, 4 mg, Intravenous, Q6H PRN, Leandrew Koyanagi, MD, 4 mg at 01/25/18 0605 .  oxyCODONE (Oxy IR/ROXICODONE) immediate release tablet 5-10 mg, 5-10 mg, Oral, Q3H PRN, Leandrew Koyanagi, MD, 10 mg at 01/26/18 0954 .  polyethylene glycol (MIRALAX / GLYCOLAX) packet 17 g, 17 g, Oral, Daily PRN, Leandrew Koyanagi, MD .  sorbitol 70 % solution 30 mL, 30 mL, Oral, Daily PRN, Leandrew Koyanagi, MD .  tamsulosin [facility]) capsule 0.4 mg, 0.4 mg, Oral, QPC supper, Leandrew Koyanagi, MD, 0.4 mg at 01/25/18 1607  Patients Current Diet: Diet regular Room service appropriate? Yes; Fluid consistency: Thin  Precautions / Restrictions Precautions Precautions: Fall Other Brace/Splint: L knee bledsoe brace, locked in extension during mobility.  Restrictions Weight Bearing Restrictions: Yes LLE Weight Bearing: Non weight bearing   Has the patient had 2 or more falls or a fall with injury in the past year?No  Prior Activity Level Community (5-7x/wk): Independent and self employed doing Proofreader.(drives)  [place] Devices/Equipment: Cane (specify quad or straight), Walker (specify type)(Straight cane and basic walker) Home Equipment: None  Prior Device Use: Indicate devices/aids used by the patient prior to current illness, exacerbation or injury? None of the above  Prior Functional Level Prior Function Level of Independence: Independent Comments: ADLs, IADLs, working, driving. Decreased activity tolerance  Self Care: Did the patient need help bathing, dressing, using the toilet or eating?  Independent  Indoor Mobility: Did the patient need assistance with walking from room to room  (with or without device)? Independent  Stairs: Did the patient need assistance with internal or external stairs (with or without device)? Independent  Functional Cognition: Did the patient need help planning regular tasks such as shopping or remembering to take medications? Independent  Current Functional Level Cognition  Overall Cognitive Status: Within Functional Limits for  tasks assessed Orientation Level: Oriented X4    Extremity Assessment (includes Sensation/Coordination)  Upper Extremity Assessment: Overall WFL for tasks assessed  Lower Extremity Assessment: Defer to PT evaluation, LLE deficits/detail LLE Deficits / Details: LLE painful consistent with fracture. Able to wiggle toes LLE Coordination: decreased gross motor    ADLs  Overall ADL's : Needs assistance/impaired Eating/Feeding: Set up, Bed level Grooming: Set up, Bed level Upper Body Bathing: Moderate assistance, Bed level Lower Body Bathing: Total assistance, Bed level Upper Body Dressing : Moderate assistance, Bed level Lower Body Dressing: Total assistance, Bed level Lower Body Dressing Details (indicate cue type and reason): don socks Toilet Transfer Details (indicate cue type and reason): Mod A +3 for simulated transfer to 3n1 (bed>recliner at bedside); 2 at top and one for LLE to maintain [place] Manipulation and Hygiene: Maximal assistance, +2 for physical assistance, Sit to/from stand Toileting - Clothing Manipulation Details (indicate cue type and reason): Max A for balance and to perform toilet hygiene Functional mobility during ADLs: Minimal assistance, Rolling walker(sit<>stand) General ADL Comments: education provded regarding AE for ADL completion for LB ADLs and for peri-care after toileting; pt return demonstrating use of reacher and sock aide with min verbal cues; issued level 4 theraband and instructed pt in bil UE HEP     Mobility  Overal bed mobility: Needs  Assistance Bed Mobility: Supine to Sit Supine to sit: Min assist, HOB elevated Sit to supine: Max assist, +2 for physical assistance General bed mobility comments: +rail, assist with LLE    Transfers  Overall transfer level: Needs assistance Equipment used: Rolling walker (2 wheeled) Transfers: Sit to/from Stand Sit to Stand: Min assist, From elevated surface Stand pivot transfers: Min assist General transfer comment: Increased time to stabilize initial standing balance.    Ambulation / Gait / Stairs / Wheelchair Mobility  Ambulation/Gait Ambulation/Gait assistance: Physicist, medical (Feet): 5 Feet Assistive device: Rolling walker (2 wheeled) Gait Pattern/deviations: Step-to pattern General Gait Details: Pt able to maintain NWB LLE for ambulation 5 feet bed to recliner. Gait velocity: decreased Gait velocity interpretation: Below normal speed for age/gender    Posture / Balance Dynamic Sitting Balance Sitting balance - Comments: one hand supported on bed rail in sitting.  Balance Overall balance assessment: Needs assistance Sitting-balance support: Feet supported, Single extremity supported Sitting balance-Leahy Scale: Fair Sitting balance - Comments: one hand supported on bed rail in sitting.  Standing balance support: Bilateral upper extremity supported, During functional activity Standing balance-Leahy Scale: Poor Standing balance comment: Static stand x 2 minutes with RW min guard assist. Pt able to maintain LLE NWB.     Special needs/care consideration BiPAP/CPAP  N/a CPM  N/a Continuous Drip IV  N/a Dialysis  N/a Life Vest  N/a Oxygen  N/a Special Bed Mighty air bed Trach Size  N/a Wound Vac n/a Skin surgical incisions and dressings; Abdominal surgical incision with staples and some serous sanguinous drainage that may have mositure related skin breakdown developing Bowel mgmt: LBM 3/16. Pt states he is having gas and pressure from the abdominal  surgical site. Bladder mgmt:  continent Diabetic mgmt  N/a   Previous Home Environment Living Arrangements: (wife and he has cuatody of a 35 year old grand child)  Lives With: Spouse, Other (Comment) Available Help at Discharge: Family, Available 24 hours/day Type of Home: House Home Layout: One level Home Access: Stairs to enter Entrance Stairs-Rails: Right, Left, Can reach both Entrance Stairs-Number of Steps: 7 Bathroom Shower/Tub: Walk-in  shower, Tub/shower unit Bathroom Toilet: Handicapped height Bathroom Accessibility: Yes How Accessible: Accessible via walker [place]: Other (Comment)  Discharge Living Setting Plans for Discharge Living Setting: Patient's home, Lives with (comment)(wife and 48 year old grand child) Type of Home at Discharge: House Discharge Home Layout: One level Discharge Home Access: Stairs to enter Entrance Stairs-Rails: Right, Left, Can reach both Entrance Stairs-Number of Steps: 7 Discharge Bathroom Shower/Tub: Tub/shower unit, Walk-in shower Discharge Bathroom Toilet: Handicapped height Discharge Bathroom Accessibility: Yes How Accessible: Accessible via walker Does the patient have any problems obtaining your medications?: Yes (Describe)(uninsured)  Social/Family/Support Systems Patient Roles: Spouse, 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 Information: wife Anticipated Caregiver: wife and son Anticipated Ambulance person Information: see above Ability/Limitations of Caregiver: wife disabled but self sufficient Caregiver Availability: 24/7 Discharge Plan Discussed with Primary Caregiver: Yes Is Caregiver In Agreement with Plan?: Yes Does Caregiver/Family have Issues with Lodging/Transportation while Pt is in Rehab?: No  Goals/Additional Needs Patient/Family Goal for Rehab: Mod I with PT and OT Expected length of stay: [facility] 10 to 14 days but pt feels 7 days ( may be because he in  uninsured) Equipment Needs: Mighty Air Bed Pt/Family Agrees to Admission and willing to participate: Yes Program Orientation Provided & Reviewed with Pt/Caregiver Including Roles  & Responsibilities: Yes  Decrease burden of Care through IP rehab admission: n/a  Possible need for SNF placement upon discharge:not anticipated  Patient Condition: This patient's condition remains as documented in the consult dated 01/26/2018, in which the Rehabilitation Physician determined and documented that the patient's condition is appropriate for intensive rehabilitative care in an inpatient rehabilitation facility. Will admit to inpatient rehab today.  Preadmission Screen Completed By:  Cleatrice Burke, 01/26/2018 1:54 PM ______________________________________________________________________   Discussed status with Dr. Naaman Plummer on 01/26/2018 at  1402 and received telephone approval for admission today.  Admission Coordinator:  Cleatrice Burke, time 8242 Date 01/26/2018             Cosigned by: Meredith Staggers, MD at 01/26/2018 2:45 PM  Revision History

## 2018-01-26 NOTE — Plan of Care (Signed)
New admit no time to progress.

## 2018-01-26 NOTE — PMR Pre-admission (Signed)
PMR Admission Coordinator Pre-Admission Assessment  Patient: Tyler Walters is an 52 y.o., male MRN: [id number] DOB: 1966-04-03 Height: 5\' 8"  (172.7 cm) Weight: (!) 147.9 kg (326 lb)              Insurance Information  PRIMARY: uninsured       Patient has applied for disability and Medicaid since 2014 and denied. He has a Sports coach firm, [employer] firm, handling his case. He also has a case worker at [employer]  Medicaid Application Date:       Case Manager:  Disability Application Date:       Case Worker:   Emergency [place]    Name Dike Work Mobile   [place] [facility] [phone]     Current Medical History  Patient Admitting Diagnosis: debility with left tibial plateau fracture after a fall  History of Present Illness:  HPI: Tyler Walters is a 52 y.o. male who was admitted on 01/16/18 after a fall of step ladder with subsequent left tibial plateau fracture. He was taken to OR for placement of external fixator for stabilization. He was evaluated by Dr. Marcelino Scot who recommended fixation once edema improved. General  Surgery consulted for input on growing abdominal mass which was felt to be likely a massive lipoma. He was taken to OR on  3/15 for excision of right chest wall and right abdominal mass with panniculectomy by Dr. Georgette Dover. He was taken to OR for ORIF left bicondylar tibial plateau,  Left tibial shaft and repair of lateral meniscus by Dr. Doreatha Martin. Post op to be NWB LLE with hinged brace in place--brace to be locked in extension at nights and unlocked during the day . He has had issues with pain control and Toradol was added yesterday with improvement in symptoms.      Past Medical History  Past Medical History:  Diagnosis Date  . Lipoma of abdominal wall    Large lipoma    Family History  family history is not on file.  Prior Rehab/Hospitalizations:  Has the patient had major surgery during 100 days prior to  admission? No  Current Medications   Current Facility-Administered Medications:  .  0.9 %  sodium chloride infusion, , Intravenous, Continuous, Leandrew Koyanagi, MD, Last Rate: 125 mL/hr at 01/24/18 2156 .  acetaminophen (TYLENOL) tablet 325-650 mg, 325-650 mg, Oral, Q6H PRN, Leandrew Koyanagi, MD, 650 mg at 01/25/18 0605 .  diphenhydrAMINE (BENADRYL) 12.5 MG/5ML elixir 25 mg, 25 mg, Oral, Q4H PRN, Leandrew Koyanagi, MD .  docusate sodium (COLACE) capsule 100 mg, 100 mg, Oral, BID, Leandrew Koyanagi, MD, 100 mg at 01/26/18 0954 .  enoxaparin (LOVENOX) injection 70 mg, 70 mg, Subcutaneous, Q24H, Altamese [place], MD, 70 mg at 01/26/18 0956 .  HYDROmorphone (DILAUDID) injection 1 mg, 1 mg, Intravenous, Q2H PRN, Leandrew Koyanagi, MD, 1 mg at 01/25/18 0400 .  ketorolac (TORADOL) 15 MG/ML injection 15 mg, 15 mg, Intravenous, Q6H, Haddix, Thomasene Lot, MD, 15 mg at 01/26/18 1158 .  lactated ringers infusion, , Intravenous, Continuous, Josephine Igo, MD, Last Rate: 10 mL/hr at 01/24/18 1036 .  magnesium citrate solution 1 Bottle, 1 Bottle, Oral, Once PRN, Leandrew Koyanagi, MD .  methocarbamol (ROBAXIN) tablet 750 mg, 750 mg, Oral, Q6H PRN, 750 mg at 01/26/18 0954 **OR** methocarbamol (ROBAXIN) 750 mg in dextrose 5 % 50 mL IVPB, 750 mg, Intravenous, Q6H PRN, [place], Kelly A, PA-C .  metoCLOPramide (REGLAN) tablet 5-10  mg, 5-10 mg, Oral, Q8H PRN **OR** metoCLOPramide (REGLAN) injection 5-10 mg, 5-10 mg, Intravenous, Q8H PRN, Leandrew Koyanagi, MD, 10 mg at 01/24/18 2154 .  morphine 2 MG/ML injection 0.5-1 mg, 0.5-1 mg, Intravenous, Q2H PRN, Leandrew Koyanagi, MD, 1 mg at 01/19/18 2130 .  ondansetron (ZOFRAN) tablet 4 mg, 4 mg, Oral, Q6H PRN, 4 mg at 01/23/18 1110 **OR** ondansetron (ZOFRAN) injection 4 mg, 4 mg, Intravenous, Q6H PRN, Leandrew Koyanagi, MD, 4 mg at 01/25/18 0605 .  oxyCODONE (Oxy IR/ROXICODONE) immediate release tablet 5-10 mg, 5-10 mg, Oral, Q3H PRN, Leandrew Koyanagi, MD, 10 mg at 01/26/18 0954 .  polyethylene glycol (MIRALAX /  GLYCOLAX) packet 17 g, 17 g, Oral, Daily PRN, Leandrew Koyanagi, MD .  sorbitol 70 % solution 30 mL, 30 mL, Oral, Daily PRN, Leandrew Koyanagi, MD .  tamsulosin [facility]) capsule 0.4 mg, 0.4 mg, Oral, QPC supper, Leandrew Koyanagi, MD, 0.4 mg at 01/25/18 1607  Patients Current Diet: Diet regular Room service appropriate? Yes; Fluid consistency: Thin  Precautions / Restrictions Precautions Precautions: Fall Other Brace/Splint: L knee bledsoe brace, locked in extension during mobility.  Restrictions Weight Bearing Restrictions: Yes LLE Weight Bearing: Non weight bearing   Has the patient had 2 or more falls or a fall with injury in the past year?No  Prior Activity Level Community (5-7x/wk): Independent and self employed doing Proofreader.(drives)  [place] / [place] Devices/Equipment: Cane (specify quad or straight), Walker (specify type)(Straight cane and basic walker) Home Equipment: None  Prior Device Use: Indicate devices/aids used by the patient prior to current illness, exacerbation or injury? None of the above  Prior Functional Level Prior Function Level of Independence: Independent Comments: ADLs, IADLs, working, driving. Decreased activity tolerance  Self Care: Did the patient need help bathing, dressing, using the toilet or eating?  Independent  Indoor Mobility: Did the patient need assistance with walking from room to room (with or without device)? Independent  Stairs: Did the patient need assistance with internal or external stairs (with or without device)? Independent  Functional Cognition: Did the patient need help planning regular tasks such as shopping or remembering to take medications? Independent  Current Functional Level Cognition  Overall Cognitive Status: Within Functional Limits for tasks assessed Orientation Level: Oriented X4    Extremity Assessment (includes Sensation/Coordination)  Upper Extremity Assessment: Overall WFL  for tasks assessed  Lower Extremity Assessment: Defer to PT evaluation, LLE deficits/detail LLE Deficits / Details: LLE painful consistent with fracture. Able to wiggle toes LLE Coordination: decreased gross motor    ADLs  Overall ADL's : Needs assistance/impaired Eating/Feeding: Set up, Bed level Grooming: Set up, Bed level Upper Body Bathing: Moderate assistance, Bed level Lower Body Bathing: Total assistance, Bed level Upper Body Dressing : Moderate assistance, Bed level Lower Body Dressing: Total assistance, Bed level Lower Body Dressing Details (indicate cue type and reason): don socks Toilet Transfer Details (indicate cue type and reason): Mod A +3 for simulated transfer to 3n1 (bed>recliner at bedside); 2 at top and one for LLE to maintain [place] Manipulation and Hygiene: Maximal assistance, +2 for physical assistance, Sit to/from stand Toileting - Clothing Manipulation Details (indicate cue type and reason): Max A for balance and to perform toilet hygiene Functional mobility during ADLs: Minimal assistance, Rolling walker(sit<>stand) General ADL Comments: education provded regarding AE for ADL completion for LB ADLs and for peri-care after toileting; pt return demonstrating use of reacher and sock aide with  min verbal cues; issued level 4 theraband and instructed pt in bil UE HEP     Mobility  Overal bed mobility: Needs Assistance Bed Mobility: Supine to Sit Supine to sit: Min assist, HOB elevated Sit to supine: Max assist, +2 for physical assistance General bed mobility comments: +rail, assist with LLE    Transfers  Overall transfer level: Needs assistance Equipment used: Rolling walker (2 wheeled) Transfers: Sit to/from Stand Sit to Stand: Min assist, From elevated surface Stand pivot transfers: Min assist General transfer comment: Increased time to stabilize initial standing balance.    Ambulation / Gait / Stairs / Wheelchair Mobility   Ambulation/Gait Ambulation/Gait assistance: Physicist, medical (Feet): 5 Feet Assistive device: Rolling walker (2 wheeled) Gait Pattern/deviations: Step-to pattern General Gait Details: Pt able to maintain NWB LLE for ambulation 5 feet bed to recliner. Gait velocity: decreased Gait velocity interpretation: Below normal speed for age/gender    Posture / Balance Dynamic Sitting Balance Sitting balance - Comments: one hand supported on bed rail in sitting.  Balance Overall balance assessment: Needs assistance Sitting-balance support: Feet supported, Single extremity supported Sitting balance-Leahy Scale: Fair Sitting balance - Comments: one hand supported on bed rail in sitting.  Standing balance support: Bilateral upper extremity supported, During functional activity Standing balance-Leahy Scale: Poor Standing balance comment: Static stand x 2 minutes with RW min guard assist. Pt able to maintain LLE NWB.     Special needs/care consideration BiPAP/CPAP  N/a CPM  N/a Continuous Drip IV  N/a Dialysis  N/a Life Vest  N/a Oxygen  N/a Special Bed Mighty air bed Trach Size  N/a Wound Vac n/a Skin surgical incisions and dressings; Abdominal surgical incision with staples and some serous sanguinous drainage that may have mositure related skin breakdown developing Bowel mgmt: LBM 3/16. Pt states he is having gas and pressure from the abdominal surgical site. Bladder mgmt:  continent Diabetic mgmt  N/a   Previous Home Environment Living Arrangements: (wife and he has cuatody of a 65 year old grand child)  Lives With: Spouse, Other (Comment) Available Help at Discharge: Family, Available 24 hours/day Type of Home: House Home Layout: One level Home Access: Stairs to enter Entrance Stairs-Rails: Right, Left, Can reach both Entrance Stairs-Number of Steps: 7 Bathroom Shower/Tub: Gaffer, Chiropodist: Handicapped height Bathroom Accessibility:  Yes How Accessible: Accessible via walker [place]: Other (Comment)  Discharge Living Setting Plans for Discharge Living Setting: Patient's home, Lives with (comment)(wife and 69 year old grand child) Type of Home at Discharge: House Discharge Home Layout: One level Discharge Home Access: Stairs to enter Entrance Stairs-Rails: Right, Left, Can reach both Entrance Stairs-Number of Steps: 7 Discharge Bathroom Shower/Tub: Tub/shower unit, Walk-in shower Discharge Bathroom Toilet: Handicapped height Discharge Bathroom Accessibility: Yes How Accessible: Accessible via walker Does the patient have any problems obtaining your medications?: Yes (Describe)(uninsured)  Social/Family/Support Systems Patient Roles: Spouse, 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 Information: wife Anticipated Caregiver: wife and son Anticipated Ambulance person Information: see above Ability/Limitations of Caregiver: wife disabled but self sufficient Caregiver Availability: 24/7 Discharge Plan Discussed with Primary Caregiver: Yes Is Caregiver In Agreement with Plan?: Yes Does Caregiver/Family have Issues with Lodging/Transportation while Pt is in Rehab?: No  Goals/Additional Needs Patient/Family Goal for Rehab: Mod I with PT and OT Expected length of stay: ELOS 10 to 14 days but pt feels 7 days ( may be because he in uninsured) Equipment Needs: Scientist, research (medical) Bed Pt/Family  Agrees to Admission and willing to participate: Yes Program Orientation Provided & Reviewed with Pt/Caregiver Including Roles  & Responsibilities: Yes  Decrease burden of Care through IP rehab admission: n/a  Possible need for SNF placement upon discharge:not anticipated  Patient Condition: This patient's condition remains as documented in the consult dated 01/26/2018, in which the Rehabilitation Physician determined and documented that the patient's condition is appropriate for intensive  rehabilitative care in an inpatient rehabilitation facility. Will admit to inpatient rehab today.  Preadmission Screen Completed By:  Cleatrice Burke, 01/26/2018 1:54 PM ______________________________________________________________________   Discussed status with Dr. Naaman Plummer on 01/26/2018 at  1402 and received telephone approval for admission today.  Admission Coordinator:  Cleatrice Burke, time 2549 Date 01/26/2018

## 2018-01-26 NOTE — Discharge Summary (Signed)
Orthopaedic Trauma Service (OTS)  Patient ID: Tyler Walters MRN: [id number] DOB/AGE: 53-May-1967 53 y.o.  Admit date: 01/16/2018 Discharge date: 01/26/2018  Admission Diagnoses: Fall off ladder Morbid obesity Closed left bicondylar tibial plateau fracture  closed left tibial shaft fracture   Discharge Diagnoses:  Principal Problem:   Closed bicondylar fracture of left tibial plateau Active Problems:   Morbid obesity (HCC)   Pannus, abdominal   Fracture of tibial shaft, left, closed   Past Medical History:  Diagnosis Date  . Lipoma of abdominal wall    Large lipoma     Procedures Performed: 01/16/2018- Dr. Erlinda Walters External fixation left tibial plateau fracture CPT 20690 uniplane   01/19/2018- Dr. Georgette Walters  Excision of right abdominal wall subcutaneous mass with panniculectomy Excision of right chest wall mass  01/24/2018- Haddix   1. CPT 27536-ORIF of left bicondylar tibial plateau fracture 2. CPT 27758-ORIF of left tibial shaft 3. CPT 27540-ORIF of tibial tubercle fracture 4. CPT 27403-Repair of lateral meniscus 5. CPT 20694-Removal of external fixator 6. CPT 11044-Debridement of external fixator pin sites      Discharged Condition: good  Hospital Course:   52 year old morbidly obese black male admitted to the hospital on 01/16/2018 after sustaining a fall off of a ladder while trying to repair a car port.  Patient had deformity to his left leg.  Brought to [facility] hospital.  Seen and evaluated by orthopedics for isolated tibial plateau and tibial shaft fractures.  Taken to the OR on the night of presentation for application of a spanning external fixator.  Due to the complexity of the injury the orthopedic trauma service was consulted for definitive management.  Patient was initially seen by the orthopedic trauma service on 01/17/2018.  This was at [facility].  It was noted that his swelling was too severe to allow for acute surgical intervention.  He would need  several days of aggressive ice elevation and soft tissue rest to allow for safe surgical intervention.  On further evaluation and discussion with the patient there is some concern given fairly rapidly growing abdominal mass which created an asymmetric pannus.  General surgery service was consulted.  Patient was eventually transferred to [facility].  He was taken to the operating room by the general surgery service where panniculectomy was performed.  Suspect that his weight and pannus caused him to fall from a ladder.  Patient's hospital stay really was uncomplicated no acute issues noted.  Ultimately his soft tissue swelling resolved enough for him to go to the OR on 01/24/2018 where the procedures noted above was performed.  He progressed very well after surgery and was ultimately deemed stable for discharge to inpatient rehab on 01/26/2018.  Consults: rehabilitation medicine and general surgery  Significant Diagnostic Studies: labs:    Results for Tyler, Walters (MRN [id number]) as of 02/08/2018 11:16  Ref. Range 01/26/2018 08:56  WBC Latest Ref Range: 4.0 - 10.5 K/uL 11.3 (H)  RBC Latest Ref Range: 4.22 - 5.81 MIL/uL 3.55 (L)  Hemoglobin Latest Ref Range: 13.0 - 17.0 g/dL 8.5 (L)  HCT Latest Ref Range: 39.0 - 52.0 % 28.3 (L)  MCV Latest Ref Range: 78.0 - 100.0 fL 79.7  MCH Latest Ref Range: 26.0 - 34.0 pg 23.9 (L)  MCHC Latest Ref Range: 30.0 - 36.0 g/dL 30.0  RDW Latest Ref Range: 11.5 - 15.5 % 16.3 (H)  Platelets Latest Ref Range: 150 - 400 K/uL 484 (H)    Treatments: IV hydration, antibiotics: Ancef, analgesia:  Dilaudid and oxycodone, anticoagulation: Weight-based Lovenox, therapies: PT, OT and RN and surgery: As above  Discharge Exam:            Orthopedic Trauma Service Progress Note    Patient ID: Tyler Walters MRN: [id number] DOB/AGE: November 12, 1965 52 y.o.   Subjective:   Patient reports pain as mild.     [name] states he is feeling well this morning with no acute  complaints. States he has been working with PT and completing exercises and stretches without significant pain or difficulty. States he was able to sit in the chair for most of the day yesterday. No difficulty breathing, fever, chills, painful urination.   Pain control much better with Toradol      ROS Per HPI   Objective:    VITALS:   Vitals:    01/25/18 0042 01/25/18 0507 01/25/18 2000 01/26/18 0450  BP: 138/76 (!) 159/86 135/87 (!) 143/93  Pulse: (!) 116 (!) 105 (!) 102 (!) 109  Resp:          Temp: 98.6 F (37 C) 98.7 F (37.1 C) 98.9 F (37.2 C) 98.8 F (37.1 C)  TempSrc: Oral Oral Oral Oral  SpO2: 95% 96% 98% 95%  Weight:          Height:              Estimated body mass index is 49.57 kg/m as calculated from the following:   Height as of this encounter: 5\' 8"  (1.727 m).   Weight as of this encounter: 147.9 kg (326 lb).     Intake/Output      03/21 0701 - 03/22 0700 03/22 0701 - 03/23 0700   P.O. 480    I.V. (mL/kg) 16542.6 (111.8)    IV Piggyback 300    Total Intake(mL/kg) 17322.6 (117.1)    Urine (mL/kg/hr) 300 (0.1) 325 (1)   Drains 5    Blood     Total Output 305 325   Net +17017.6 -325        Urine Occurrence 800 x       LABS   Results for Tyler, Walters (MRN [id number]) as of 01/26/2018 09:46   Ref. Range 01/26/2018 08:56  WBC Latest Ref Range: 4.0 - 10.5 K/uL 11.3 (H)  RBC Latest Ref Range: 4.22 - 5.81 MIL/uL 3.55 (L)  Hemoglobin Latest Ref Range: 13.0 - 17.0 g/dL 8.5 (L)  HCT Latest Ref Range: 39.0 - 52.0 % 28.3 (L)  MCV Latest Ref Range: 78.0 - 100.0 fL 79.7  MCH Latest Ref Range: 26.0 - 34.0 pg 23.9 (L)  MCHC Latest Ref Range: 30.0 - 36.0 g/dL 30.0  RDW Latest Ref Range: 11.5 - 15.5 % 16.3 (H)  Platelets Latest Ref Range: 150 - 400 K/uL 484 (H)      PHYSICAL EXAM:    Gen: Resting comfortably in bed. Lungs: Clear to auscultation bilaterally Cardiac: Regular rhythm. Abd: bowel sounds normoactive. Lower abdominal incision clean and  dry Ext:                                       Left Lower Extremity                          Sensation grossly intact  Foot warm and well perfused.                         PF, DF, ankle rotation intact                         Flexion, extension of digits intact                         No DCT                         Compartments soft                         Dressing clean, dry, intact                         Dressing removed                         All wounds look good,incisions healing well. No drainage, no signs of infection                          Ex fix pinsites are stable, no drainage                             Assessment/Plan: 2 Days Post-Op    Principal Problem:   Closed bicondylar fracture of left tibial plateau Active Problems:   Pannus, abdominal                Anti-infectives (From admission, onward)    Start     Dose/Rate Route Frequency Ordered Stop    01/24/18 2300   ceFAZolin (ANCEF) IVPB 2g/100 mL premix     2 g 200 mL/hr over 30 Minutes Intravenous Every 8 hours 01/24/18 1629 01/25/18 1433    01/24/18 1320   vancomycin (VANCOCIN) powder  Status:  Discontinued         As needed 01/24/18 1321 01/24/18 1525    01/24/18 1310   tobramycin (NEBCIN) powder  Status:  Discontinued         As needed 01/24/18 1311 01/24/18 1525    01/24/18 0800   ceFAZolin (ANCEF) 3 g in dextrose 5 % 50 mL IVPB     3 g 130 mL/hr over 30 Minutes Intravenous  Once 01/23/18 0921 01/24/18 1507    01/19/18 1800   ceFAZolin (ANCEF) 3 g in dextrose 5 % 50 mL IVPB     3 g 160 mL/hr over 30 Minutes Intravenous To [facility]Stay [facility] 01/19/18 1720 01/20/18 1800    01/19/18 1200   ceFAZolin (ANCEF) 3 g in dextrose 5 % 50 mL IVPB     3 g 160 mL/hr over 30 Minutes Intravenous To ShortStay Surgical 01/19/18 1157 01/19/18 1202    01/17/18 0200   ceFAZolin (ANCEF) IVPB 2g/100 mL premix     2 g 200 mL/hr over 30 Minutes Intravenous Every 6 hours 01/16/18 2226 01/17/18 1430     01/16/18 1915   ceFAZolin (ANCEF) 3 g in dextrose 5 % 50 mL IVPB    Note to Pharmacy:  Anesthesia to give preop   3 g 130 mL/hr over 30 Minutes Intravenous  Once 01/16/18 1859 01/16/18 1922     .  POD#: 2   52 yo male s/p ORIF of left tibial plateau fracture   - L bicondylar tibial plateau fracture s/p ORIF             NWB x 8 weeks             Unrestricted ROM L knee             Do not let knee rest in flexion, use pillows under ankle or zero knee bone foam to maintain full extension at rest.  Can also sit in chair with knees bent to 90 degrees as well              Lock brace in full extension at night and unlock during the day                PT/OT             Ice PRN              Dressing changes as needed   - Pain management:             Dilaudid             toradol    - ABL anemia/Hemodynamics             Continue to monitor       - Medical issues              Abdomen per general surgery   - DVT/PE prophylaxis:             lovenox SQ - weight based dose             SCD             Recommend lovenox for another 21 days    - ID:             periop abx given   - Activity:             NWB LLE   - FEN/GI prophylaxis/Foley/Lines:             Reg diet   -Ex-fix/Splint care:             Hinged knee brace   - Dispo:             Continue PT/OT             Plan for discharge to CIR             CIR consulted      Tonny Branch, PA-S2 01/26/2018, 9:16 AM    I have seen and evaluated the Pt with PA student Hunt. I agree with the above note. We performed dressing change together  Formal consult to CIR made Pt can DC to CIR when bed available if he is deemed an appropriate candidate    Jari Pigg, PA-C [facility] [phone] (C) [phone] (O) 01/26/2018 9:53 AM     Disposition: Inpatient rehab   Medications: Inpatient rehab admission  Allergies as of 01/26/2018      Reactions   Morphine And Related Nausea Only       Medication List    You have not been prescribed any medications.    Follow-up Information    Haddix, Thomasene Lot, MD. Schedule an appointment as soon as possible for a visit in 7 day(s).   Specialty:  Orthopedic Surgery Contact information: [address] [place] [address]  [phone]            Signed:  Jari Pigg, PA-C [facility] [phone] (P) 02/08/2018, 11:05 AM

## 2018-01-26 NOTE — Progress Notes (Signed)
Meredith Staggers, MD  Physician  Physical Medicine and Rehabilitation  Consult Note  Signed  Date of Service:  01/26/2018 11:20 AM       Related encounter: ED to Hosp-Admission (Current) from 01/16/2018 in [place] All Collapse All      [] Hide copied text  [] Hover for details        Physical Medicine and Rehabilitation Consult   Reason for Consult: Functional deficits due to left bicondylar tibial plateau fracture and morbid obesity Referring Physician: Dr. Doreatha Martin.    HPI: Tyler Walters is a 52 y.o. male who was admitted on 01/16/18 after a fall of step ladder with subsequent left tibial plateau fracture. He was taken to OR for placement of external fixator for stabilization. He was evaluated by Dr. Marcelino Scot who recommended fixation once edema improved. General  Surgery consulted for input on growing abdominal mass which was felt to be likely a massive lipoma. He was taken to OR on  3/15 for excision of right chest wall and right abdominal mass with panniculectomy by Dr. Georgette Dover. He was taken to OR for ORIF left bicondylar tibial plateau,  Left tibial shaft and repair of lateral meniscus by Dr. Doreatha Martin. Post op to be NWB LLE with hinged brace in place--brace to be locked in extension at nights and unlocked during the day . He has had issues with pain control and Toradol was added yesterday with improvement in symptoms. Therapy ongoing and CIR recommended for follow up therapy.    ROS        Past Medical History:  Diagnosis Date  . Lipoma of abdominal wall    Large lipoma         Past Surgical History:  Procedure Laterality Date  . EXTERNAL FIXATION LEG Left 01/16/2018   Procedure: LEFT KNEE SPANNING EXTERNAL FIXATION LEG;  Surgeon: Leandrew Koyanagi, MD;  Location: [facility];  Service: Orthopedics;  Laterality: Left;  . LIPOMA EXCISION Right 01/19/2018   Procedure: EXCISIONOF RIGHT CHEST WALL  MASS, EXCISION OF ABDOMINAL MASS;  Surgeon: Donnie Mesa, MD;  Location: [place];  Service: General;  Laterality: Right;  . PANNICULECTOMY N/A 01/19/2018   Procedure: PANNICULECTOMY;  Surgeon: Donnie Mesa, MD;  Location: [place];  Service: General;  Laterality: N/A;    History reviewed. No pertinent family history.    Social History:  reports that he has never smoked. He has never used smokeless tobacco. He reports that he does not drink alcohol or use drugs.       Allergies  Allergen Reactions  . Morphine And Related Nausea Only          Medications Prior to Admission  Medication Sig Dispense Refill  . docusate sodium (COLACE) 100 MG capsule Take 1 capsule (100 mg total) by mouth every 12 (twelve) hours. (Patient not taking: Reported on 01/16/2018) 60 capsule 0  . oxyCODONE (ROXICODONE) 5 MG immediate release tablet Take 1 tablet (5 mg total) by mouth every 4 (four) hours as needed for severe pain. (Patient not taking: Reported on 01/16/2018) 7 tablet 0  . tamsulosin (FLOMAX) 0.4 MG CAPS capsule Take 1 capsule (0.4 mg total) by mouth daily after supper. (Patient not taking: Reported on 01/16/2018) 30 capsule 0    Home: Home Living Family/patient expects to be discharged to:: Private residence Living Arrangements: Spouse/significant other, Other relatives Available Help at Discharge: Family Type of Home: House  Home Access: Stairs to enter [employer] of Steps: 7 Entrance Stairs-Rails: Can reach both Home Layout: One level Bathroom Shower/Tub: Gaffer, Chiropodist: Handicapped height Home Equipment: None  Functional History: Prior Function Level of Independence: Independent Comments: ADLs, IADLs, working, driving. Decreased activity tolerance Functional Status:  Mobility: Bed Mobility Overal bed mobility: Needs Assistance Bed Mobility: Supine to Sit Supine to sit: Min assist, HOB elevated Sit to supine: Max assist, +2 for  physical assistance General bed mobility comments: +rail, assist with LLE Transfers Overall transfer level: Needs assistance Equipment used: Rolling walker (2 wheeled) Transfers: Sit to/from Stand Sit to Stand: Min assist, From elevated surface Stand pivot transfers: Min assist General transfer comment: Increased time to stabilize initial standing balance. Ambulation/Gait Ambulation/Gait assistance: Min guard Ambulation Distance (Feet): 5 Feet Assistive device: Rolling walker (2 wheeled) Gait Pattern/deviations: Step-to pattern General Gait Details: Pt able to maintain NWB LLE for ambulation 5 feet bed to recliner. Gait velocity: decreased Gait velocity interpretation: Below normal speed for age/gender  ADL: ADL Overall ADL's : Needs assistance/impaired Eating/Feeding: Set up, Bed level Grooming: Set up, Bed level Upper Body Bathing: Moderate assistance, Bed level Lower Body Bathing: Total assistance, Bed level Upper Body Dressing : Moderate assistance, Bed level Lower Body Dressing: Total assistance, Bed level Lower Body Dressing Details (indicate cue type and reason): don socks Toilet Transfer Details (indicate cue type and reason): Mod A +3 for simulated transfer to 3n1 (bed>recliner at bedside); 2 at top and one for LLE to maintain [place] Manipulation and Hygiene: Maximal assistance, +2 for physical assistance, Sit to/from stand Toileting - Clothing Manipulation Details (indicate cue type and reason): Max A for balance and to perform toilet hygiene Functional mobility during ADLs: Minimal assistance, Rolling walker(sit<>stand) General ADL Comments: education provded regarding AE for ADL completion for LB ADLs and for peri-care after toileting; pt return demonstrating use of reacher and sock aide with min verbal cues; issued level 4 theraband and instructed pt in bil UE HEP   Cognition: Cognition Overall Cognitive Status: Within Functional Limits for  tasks assessed Orientation Level: Oriented X4 Cognition Arousal/Alertness: Awake/alert Behavior During Therapy: WFL for tasks assessed/performed Overall Cognitive Status: Within Functional Limits for tasks assessed  Blood pressure (!) 143/93, pulse (!) 109, temperature 98.8 F (37.1 C), temperature source Oral, resp. rate (!) 24, height 5\' 8"  (1.727 m), weight (!) 147.9 kg (326 lb), SpO2 95 %. Physical Exam  LabResultsLast24Hours       Results for orders placed or performed during the hospital encounter of 01/16/18 (from the past 24 hour(s))  CBC     Status: Abnormal   Collection Time: 01/26/18  8:56 AM  Result Value Ref Range   WBC 11.3 (H) 4.0 - 10.5 K/uL   RBC 3.55 (L) 4.22 - 5.81 MIL/uL   Hemoglobin 8.5 (L) 13.0 - 17.0 g/dL   HCT 28.3 (L) 39.0 - 52.0 %   MCV 79.7 78.0 - 100.0 fL   MCH 23.9 (L) 26.0 - 34.0 pg   MCHC 30.0 30.0 - 36.0 g/dL   RDW 16.3 (H) 11.5 - 15.5 %   Platelets 484 (H) 150 - 400 K/uL      ImagingResults(Last48hours)  Dg Tibia/fibula Left  Result Date: 01/24/2018 CLINICAL DATA:  Fixation of fracture EXAM: DG C-ARM 61-120 MIN; LEFT TIBIA AND FIBULA - 2 VIEW COMPARISON:  CT 01/17/2018, radiograph 01/16/2017 FINDINGS: Nine low resolution intraoperative spot views of the left tibia and fibula. Total fluoroscopy time was 5  minutes 31 seconds. Images demonstrate surgical plate and multiple screw fixation of markedly comminuted proximal tibial fracture. A markedly comminuted proximal fibular fracture is also evident. IMPRESSION: Intraoperative fluoroscopic assistance provided during surgical fixation of proximal tibial fracture. Electronically Signed   By: Donavan Foil M.D.   On: 01/24/2018 14:52   Dg Knee Left Port  Result Date: 01/24/2018 CLINICAL DATA:  Status post ORIF of proximal left tibial fracture EXAM: PORTABLE LEFT KNEE - 1-2 VIEW COMPARISON:  Intra op films from earlier in the same day. FINDINGS: Fixation sideplate is noted along the  lateral aspect of the tibia. Multiple fixation screws are noted. Fracture fragments are stable in appearance when compared with the prior intraoperative exams. IMPRESSION: Status post ORIF of proximal left tibial fracture. Electronically Signed   By: Inez Catalina M.D.   On: 01/24/2018 16:30   Dg C-arm 1-60 Min  Result Date: 01/24/2018 CLINICAL DATA:  Fixation of fracture EXAM: DG C-ARM 61-120 MIN; LEFT TIBIA AND FIBULA - 2 VIEW COMPARISON:  CT 01/17/2018, radiograph 01/16/2017 FINDINGS: Nine low resolution intraoperative spot views of the left tibia and fibula. Total fluoroscopy time was 5 minutes 31 seconds. Images demonstrate surgical plate and multiple screw fixation of markedly comminuted proximal tibial fracture. A markedly comminuted proximal fibular fracture is also evident. IMPRESSION: Intraoperative fluoroscopic assistance provided during surgical fixation of proximal tibial fracture. Electronically Signed   By: Donavan Foil M.D.   On: 01/24/2018 14:52   Dg C-arm 1-60 Min  Result Date: 01/24/2018 CLINICAL DATA:  Fixation of fracture EXAM: DG C-ARM 61-120 MIN; LEFT TIBIA AND FIBULA - 2 VIEW COMPARISON:  CT 01/17/2018, radiograph 01/16/2017 FINDINGS: Nine low resolution intraoperative spot views of the left tibia and fibula. Total fluoroscopy time was 5 minutes 31 seconds. Images demonstrate surgical plate and multiple screw fixation of markedly comminuted proximal tibial fracture. A markedly comminuted proximal fibular fracture is also evident. IMPRESSION: Intraoperative fluoroscopic assistance provided during surgical fixation of proximal tibial fracture. Electronically Signed   By: Donavan Foil M.D.   On: 01/24/2018 14:52   Dg C-arm 1-60 Min  Result Date: 01/24/2018 CLINICAL DATA:  Fixation of fracture EXAM: DG C-ARM 61-120 MIN; LEFT TIBIA AND FIBULA - 2 VIEW COMPARISON:  CT 01/17/2018, radiograph 01/16/2017 FINDINGS: Nine low resolution intraoperative spot views of the left tibia and  fibula. Total fluoroscopy time was 5 minutes 31 seconds. Images demonstrate surgical plate and multiple screw fixation of markedly comminuted proximal tibial fracture. A markedly comminuted proximal fibular fracture is also evident. IMPRESSION: Intraoperative fluoroscopic assistance provided during surgical fixation of proximal tibial fracture. Electronically Signed   By: Donavan Foil M.D.   On: 01/24/2018 14:52     Assessment/Plan: Diagnosis: Morbidly obese male with debility and left tibial plateau fx after fall from ladder. Also s/p large paniculectomy.  1. Does the need for close, 24 hr/day medical supervision in concert with the patient's rehab needs make it unreasonable for this patient to be served in a less intensive setting? Yes 2. Co-Morbidities requiring supervision/potential complications: wound care, pain 3. Due to bladder management, bowel management, safety, skin/wound care, disease management, medication administration, pain management and patient education, does the patient require 24 hr/day rehab nursing? Yes 4. Does the patient require coordinated care of a physician, rehab nurse, PT (1-2 hrs/day, 5 days/week) and OT (1-2 hrs/day, 5 days/week) to address physical and functional deficits in the context of the above medical diagnosis(es)? Yes Addressing deficits in the following areas: balance, endurance, locomotion, strength, transferring,  bowel/bladder control, bathing, dressing, feeding, grooming, toileting and psychosocial support 5. Can the patient actively participate in an intensive therapy program of at least 3 hrs of therapy per day at least 5 days per week? Yes 6. The potential for patient to make measurable gains while on inpatient rehab is excellent 7. Anticipated functional outcomes upon discharge from inpatient rehab are modified independent and supervision  with PT, modified independent and supervision with OT, n/a with SLP. 8. Estimated rehab length of stay to reach  the above functional goals is: 10-14 days 9. Anticipated D/C setting: Home 10. Anticipated post D/C treatments: [place] therapy 11. Overall Rehab/Functional Prognosis: excellent  RECOMMENDATIONS: This patient's condition is appropriate for continued rehabilitative care in the following setting: CIR Patient has agreed to participate in recommended program. Yes Note that insurance prior authorization may be required for reimbursement for recommended care.  Comment: Rehab Admissions Coordinator to follow up.  Thanks,  Meredith Staggers, MD, Mellody Drown    Bary Leriche, PA-C 01/26/2018          Revision History

## 2018-01-26 NOTE — H&P (Signed)
Physical Medicine and Rehabilitation Admission H&P    CC: Functional deficits due to left bicondylar tibial plateau fracture and morbid obesity    HPI: Tyler Walters is a 52 y.o. male who was admitted on 01/16/18 after a fall of step ladder with subsequent left tibial plateau fracture. He was taken to OR for placement of external fixator for stabilization. He was evaluated by Dr. Marcelino Scot who recommended fixation once edema improved. General  Surgery consulted for input on growing abdominal mass which was felt to be likely a massive lipoma. He was taken to OR on  3/15 for excision of right chest wall and right abdominal mass with panniculectomy by Dr. Georgette Dover. He was taken to OR for ORIF left bicondylar tibial plateau,  Left tibial shaft and repair of lateral meniscus by Dr. Doreatha Martin. Post op to be NWB LLE with hinged brace in place--brace to be locked in extension at nights and unlocked during the day . He has had issues with pain control and Toradol was added yesterday with improvement in symptoms. Therapy ongoing and CIR recommended for follow up therapy.    Review of Systems  Constitutional: Negative for chills, fever and malaise/fatigue.  HENT: Negative for hearing loss and tinnitus.   Eyes: Negative for blurred vision and double vision.  Respiratory: Negative for cough and hemoptysis.   Cardiovascular: Positive for leg swelling (left ankle swells ). Negative for chest pain and palpitations.  Gastrointestinal: Positive for constipation. Negative for heartburn and nausea.  Genitourinary: Negative for dysuria and urgency.  Musculoskeletal: Negative for back pain, falls and myalgias.       Right knee instability.   Skin: Negative for itching and rash.  Neurological: Positive for weakness. Negative for dizziness and headaches.  Psychiatric/Behavioral: Negative for memory loss. The patient does not have insomnia.       Past Medical History:  Diagnosis Date  . Lipoma of abdominal wall    Large lipoma    Past Surgical History:  Procedure Laterality Date  . EXTERNAL FIXATION LEG Left 01/16/2018   Procedure: LEFT KNEE SPANNING EXTERNAL FIXATION LEG;  Surgeon: Leandrew Koyanagi, MD;  Location: [facility];  Service: Orthopedics;  Laterality: Left;  . LIPOMA EXCISION Right 01/19/2018   Procedure: EXCISIONOF RIGHT CHEST WALL MASS, EXCISION OF ABDOMINAL MASS;  Surgeon: Donnie Mesa, MD;  Location: [facility];  Service: General;  Laterality: Right;  . PANNICULECTOMY N/A 01/19/2018   Procedure: PANNICULECTOMY;  Surgeon: Donnie Mesa, MD;  Location: [facility];  Service: General;  Laterality: N/A;    Family History  Problem Relation Age of Onset  . Diabetes Mother   . Diabetes Father      Social History:  Married. Independent PTA--has been out of work since 2014. Working on getting disability. Wife disable due to  Multiple issues. He  reports that he has never smoked. He has never used smokeless tobacco. He reports that he does not drink alcohol or use drugs.   Allergies  Allergen Reactions  . Morphine And Related Nausea Only   Medications Prior to Admission  Medication Sig Dispense Refill  . docusate sodium (COLACE) 100 MG capsule Take 1 capsule (100 mg total) by mouth every 12 (twelve) hours. (Patient not taking: Reported on 01/16/2018) 60 capsule 0  . oxyCODONE (ROXICODONE) 5 MG immediate release tablet Take 1 tablet (5 mg total) by mouth every 4 (four) hours as needed for severe pain. (Patient not taking: Reported on 01/16/2018) 7 tablet 0  . tamsulosin (FLOMAX) 0.4  MG CAPS capsule Take 1 capsule (0.4 mg total) by mouth daily after supper. (Patient not taking: Reported on 01/16/2018) 30 capsule 0    Drug Regimen Review  Drug regimen was reviewed and remains appropriate with no significant issues identified  Home: Home Living Family/patient expects to be discharged to:: Private residence Living Arrangements: (wife and he has cuatody of a 7 year old grand child) Available Help at  Discharge: Family, Available 24 hours/day Type of Home: House Home Access: Stairs to enter [employer] of Steps: 7 Entrance Stairs-Rails: Right, Left, Can reach both Home Layout: One level Bathroom Shower/Tub: Gaffer, Chiropodist: Handicapped height Bathroom Accessibility: Yes Home Equipment: None  Lives With: Spouse, Other (Comment)   Functional History: Prior Function Level of Independence: Independent Comments: ADLs, IADLs, working, driving. Decreased activity tolerance  Functional Status:  Mobility: Bed Mobility Overal bed mobility: Needs Assistance Bed Mobility: Supine to Sit Supine to sit: Min assist, HOB elevated Sit to supine: Max assist, +2 for physical assistance General bed mobility comments: +rail, assist with LLE Transfers Overall transfer level: Needs assistance Equipment used: Rolling walker (2 wheeled) Transfers: Sit to/from Stand Sit to Stand: Min assist, From elevated surface Stand pivot transfers: Min assist General transfer comment: Increased time to stabilize initial standing balance. Ambulation/Gait Ambulation/Gait assistance: Min guard Ambulation Distance (Feet): 5 Feet Assistive device: Rolling walker (2 wheeled) Gait Pattern/deviations: Step-to pattern General Gait Details: Pt able to maintain NWB LLE for ambulation 5 feet bed to recliner. Gait velocity: decreased Gait velocity interpretation: Below normal speed for age/gender    ADL: ADL Overall ADL's : Needs assistance/impaired Eating/Feeding: Set up, Bed level Grooming: Set up, Bed level Upper Body Bathing: Moderate assistance, Bed level Lower Body Bathing: Total assistance, Bed level Upper Body Dressing : Moderate assistance, Bed level Lower Body Dressing: Total assistance, Bed level Lower Body Dressing Details (indicate cue type and reason): don socks Toilet Transfer Details (indicate cue type and reason): Mod A +3 for simulated transfer to 3n1  (bed>recliner at bedside); 2 at top and one for LLE to maintain [place] Manipulation and Hygiene: Maximal assistance, +2 for physical assistance, Sit to/from stand Toileting - Clothing Manipulation Details (indicate cue type and reason): Max A for balance and to perform toilet hygiene Functional mobility during ADLs: Minimal assistance, Rolling walker(sit<>stand) General ADL Comments: education provded regarding AE for ADL completion for LB ADLs and for peri-care after toileting; pt return demonstrating use of reacher and sock aide with min verbal cues; issued level 4 theraband and instructed pt in bil UE HEP   Cognition: Cognition Overall Cognitive Status: Within Functional Limits for tasks assessed Orientation Level: Oriented X4 Cognition Arousal/Alertness: Awake/alert Behavior During Therapy: WFL for tasks assessed/performed Overall Cognitive Status: Within Functional Limits for tasks assessed  Physical Exam: Blood pressure (!) 137/91, pulse (!) 111, temperature 98.7 F (37.1 C), temperature source Oral, resp. rate (!) 24, height 5\' 8"  (1.727 m), weight (!) 147.9 kg (326 lb), SpO2 98 %. Physical Exam  Constitutional: He is oriented to person, place, and time.  Eyes: Pupils are equal, round, and reactive to light. Conjunctivae and EOM are normal.  Neck: Normal range of motion. Neck supple.  Respiratory: Effort normal and breath sounds normal. No stridor.  GI: Soft. Bowel sounds are normal. He exhibits no distension. There is no tenderness.  Obese. Lower abdominal incision with sutures in place> Min amount of serosanguinous drainage on towel overlying incision.   Musculoskeletal: He exhibits edema. He exhibits  no tenderness.  LLE limited by surgery and brace. Dry dressing on incision and Bledsoe brace in place.   Neurological: He is alert and oriented to person, place, and time. No cranial nerve deficit.  Speech clear. Able to follow commands without difficulty. Moves  BUE/RLE without difficulty.   Skin: Skin is warm and dry.  Psychiatric: He has a normal mood and affect. His behavior is normal. Judgment and thought content normal.    Results for orders placed or performed during the hospital encounter of 01/16/18 (from the past 48 hour(s))  CBC     Status: Abnormal   Collection Time: 01/25/18  6:41 AM  Result Value Ref Range   WBC 16.3 (H) 4.0 - 10.5 K/uL   RBC 3.66 (L) 4.22 - 5.81 MIL/uL   Hemoglobin 8.9 (L) 13.0 - 17.0 g/dL   HCT 29.0 (L) 39.0 - 52.0 %   MCV 79.2 78.0 - 100.0 fL   MCH 24.3 (L) 26.0 - 34.0 pg   MCHC 30.7 30.0 - 36.0 g/dL   RDW 16.1 (H) 11.5 - 15.5 %   Platelets 491 (H) 150 - 400 K/uL    Comment: Performed at [facility], [address], [place], [place] [address]  CBC     Status: Abnormal   Collection Time: 01/26/18  8:56 AM  Result Value Ref Range   WBC 11.3 (H) 4.0 - 10.5 K/uL   RBC 3.55 (L) 4.22 - 5.81 MIL/uL   Hemoglobin 8.5 (L) 13.0 - 17.0 g/dL   HCT 28.3 (L) 39.0 - 52.0 %   MCV 79.7 78.0 - 100.0 fL   MCH 23.9 (L) 26.0 - 34.0 pg   MCHC 30.0 30.0 - 36.0 g/dL   RDW 16.3 (H) 11.5 - 15.5 %   Platelets 484 (H) 150 - 400 K/uL    Comment: Performed at [facility], [place] [address] [place], [place] [address]   Dg Knee Left Port  Result Date: 01/24/2018 CLINICAL DATA:  Status post ORIF of proximal left tibial fracture EXAM: PORTABLE LEFT KNEE - 1-2 VIEW COMPARISON:  Intra op films from earlier in the same day. FINDINGS: Fixation sideplate is noted along the lateral aspect of the tibia. Multiple fixation screws are noted. Fracture fragments are stable in appearance when compared with the prior intraoperative exams. IMPRESSION: Status post ORIF of proximal left tibial fracture. Electronically Signed   By: Inez Catalina M.D.   On: 01/24/2018 16:30       Medical Problem List and Plan: 1.  Functional deficits  secondary to tibial plateau fracture s/p ORIF and panniculectomy. NWB LLE with Bledsoe brace. Lock out brace  when in bed.  2.  DVT Prophylaxis/Anticoagulation: Pharmaceutical: Lovenox 3. Pain Management: Continue Toradol for 5 total days. Oxycodone and robaxin prn.  4. Mood: LCSW to follow for evaluation and support.  5. Neuropsych: This patient is capable of making decisions on his own behalf. 6. Skin/Wound Care: Vac removed today. Will add dressing changes bid to abdominal wound.  7. Fluids/Electrolytes/Nutrition: Monitor I/O. Check lytes in am.  8. ABLA: Will recheck labs in am. 9. Leucocytosis: Likely reactive. Monitor for signs of infection 10. Obesity: Encourage weight loss and life style changes to help promote mobility and overall health.  11. Constipation: Will start Miralax bid.    Post Admission Physician Evaluation: 1. Functional deficits secondary  to tibial plateau fracture s/p ORIF and panniculectomy. 2. Patient is admitted to receive collaborative, interdisciplinary care between the physiatrist, rehab nursing staff,  and therapy team. 3. Patient's level of medical complexity and substantial therapy needs in context of that medical necessity cannot be provided at a lesser intensity of care such as a SNF. 4. Patient has experienced substantial functional loss from his/her baseline which was documented above under the "Functional History" and "Functional Status" headings.  Judging by the patient's diagnosis, physical exam, and functional history, the patient has potential for functional progress which will result in measurable gains while on inpatient rehab.  These gains will be of substantial and practical use upon discharge  in facilitating mobility and self-care at the household level. 5. Physiatrist will provide 24 hour management of medical needs as well as oversight of the therapy plan/treatment and provide guidance as appropriate regarding the interaction of the two. 6. The Preadmission Screening has been reviewed and patient status is unchanged unless otherwise stated above. 7. 24  hour rehab nursing will assist with bladder management, bowel management, safety, skin/wound care, disease management, medication administration, pain management and patient education  and help integrate therapy concepts, techniques,education, etc. 8. PT will assess and treat for/with: Lower extremity strength, range of motion, stamina, balance, functional mobility, safety, adaptive techniques and equipment, ortho precautions, pain control, activity tolerance.   Goals are: mod I. 9. OT will assess and treat for/with: ADL's, functional mobility, safety, upper extremity strength, adaptive techniques and equipment, pain mgt, ortho precautions.   Goals are: mod I. Therapy may proceed with showering this patient. 10. SLP will assess and treat for/with: n/a.  Goals are: n/a. 11. Case Management and Social Worker will assess and treat for psychological issues and discharge planning. 12. Team conference will be held weekly to assess progress toward goals and to determine barriers to discharge. 13. Patient will receive at least 3 hours of therapy per day at least 5 days per week. 14. ELOS: 10-14 days       15. Prognosis:  excellent     Meredith Staggers, MD, [facility] 01/26/2018  Bary Leriche, PA-C 01/26/2018

## 2018-01-26 NOTE — Plan of Care (Signed)
  Problem: Elimination: Goal: Will not experience complications related to bowel motility Outcome: Progressing   Problem: Safety: Goal: Ability to remain free from injury will improve Outcome: Progressing   Problem: Skin Integrity: Goal: Risk for impaired skin integrity will decrease Outcome: Progressing   

## 2018-01-26 NOTE — Progress Notes (Addendum)
Patient ID: Tyler Walters, male   DOB: 04/28/1966, 52 y.o.   MRN: [id number]    2 Days Post-Op  Subjective: No new complaints.  Belly feels well.  Objective: Vital signs in last 24 hours: Temp:  [98.8 F (37.1 C)-98.9 F (37.2 C)] 98.8 F (37.1 C) (03/22 0450) Pulse Rate:  [102-109] 109 (03/22 0450) BP: (135-143)/(87-93) 143/93 (03/22 0450) SpO2:  [95 %-98 %] 95 % (03/22 0450) Last BM Date: 01/20/18  Intake/Output from previous day: 03/21 0701 - 03/22 0700 In: 17322.6 [P.O.:480; I.V.:16542.6; IV Piggyback:300] Out: 305 [Urine:300; Drains:5] Intake/Output this shift: Total I/O In: -  Out: 325 [Urine:325]  PE: Gen: NAD Chest: right chest wall incision is c/d/i Abd: soft, NT, morbidly obese, Prevena VAC removed today.  Wound is clean and intact.  No evidence of infection or purulent drainage.  Staples and sutures remain in place.  Lab Results:  Recent Labs    01/24/18 0623 01/25/18 0641  WBC 11.5* 16.3*  HGB 9.5* 8.9*  HCT 31.2* 29.0*  PLT 459* 491*   BMET Recent Labs    01/24/18 0623  NA 135  K 3.8  CL 99*  CO2 27  GLUCOSE 96  BUN 13  CREATININE 0.93  CALCIUM 8.2*   PT/INR Recent Labs    01/24/18 0623  LABPROT 14.7  INR 1.15   CMP     Component Value Date/Time   NA 135 01/24/2018 0623   K 3.8 01/24/2018 0623   CL 99 (L) 01/24/2018 0623   CO2 27 01/24/2018 0623   GLUCOSE 96 01/24/2018 0623   BUN 13 01/24/2018 0623   CREATININE 0.93 01/24/2018 0623   CALCIUM 8.2 (L) 01/24/2018 0623   PROT 6.2 (L) 01/24/2018 0623   ALBUMIN 2.4 (L) 01/24/2018 0623   AST 22 01/24/2018 0623   ALT 18 01/24/2018 0623   ALKPHOS 63 01/24/2018 0623   BILITOT 0.6 01/24/2018 0623   GFRNONAA >60 01/24/2018 0623   GFRAA >60 01/24/2018 0623   Lipase     Component Value Date/Time   LIPASE 19 12/23/2016 0818       Studies/Results: Dg Tibia/fibula Left  Result Date: 01/24/2018 CLINICAL DATA:  Fixation of fracture EXAM: DG C-ARM 61-120 MIN; LEFT TIBIA AND FIBULA  - 2 VIEW COMPARISON:  CT 01/17/2018, radiograph 01/16/2017 FINDINGS: Nine low resolution intraoperative spot views of the left tibia and fibula. Total fluoroscopy time was 5 minutes 31 seconds. Images demonstrate surgical plate and multiple screw fixation of markedly comminuted proximal tibial fracture. A markedly comminuted proximal fibular fracture is also evident. IMPRESSION: Intraoperative fluoroscopic assistance provided during surgical fixation of proximal tibial fracture. Electronically Signed   By: Donavan Foil M.D.   On: 01/24/2018 14:52   Dg Knee Left Port  Result Date: 01/24/2018 CLINICAL DATA:  Status post ORIF of proximal left tibial fracture EXAM: PORTABLE LEFT KNEE - 1-2 VIEW COMPARISON:  Intra op films from earlier in the same day. FINDINGS: Fixation sideplate is noted along the lateral aspect of the tibia. Multiple fixation screws are noted. Fracture fragments are stable in appearance when compared with the prior intraoperative exams. IMPRESSION: Status post ORIF of proximal left tibial fracture. Electronically Signed   By: Inez Catalina M.D.   On: 01/24/2018 16:30   Dg C-arm 1-60 Min  Result Date: 01/24/2018 CLINICAL DATA:  Fixation of fracture EXAM: DG C-ARM 61-120 MIN; LEFT TIBIA AND FIBULA - 2 VIEW COMPARISON:  CT 01/17/2018, radiograph 01/16/2017 FINDINGS: Nine low resolution intraoperative spot views of the  left tibia and fibula. Total fluoroscopy time was 5 minutes 31 seconds. Images demonstrate surgical plate and multiple screw fixation of markedly comminuted proximal tibial fracture. A markedly comminuted proximal fibular fracture is also evident. IMPRESSION: Intraoperative fluoroscopic assistance provided during surgical fixation of proximal tibial fracture. Electronically Signed   By: Donavan Foil M.D.   On: 01/24/2018 14:52   Dg C-arm 1-60 Min  Result Date: 01/24/2018 CLINICAL DATA:  Fixation of fracture EXAM: DG C-ARM 61-120 MIN; LEFT TIBIA AND FIBULA - 2 VIEW COMPARISON:   CT 01/17/2018, radiograph 01/16/2017 FINDINGS: Nine low resolution intraoperative spot views of the left tibia and fibula. Total fluoroscopy time was 5 minutes 31 seconds. Images demonstrate surgical plate and multiple screw fixation of markedly comminuted proximal tibial fracture. A markedly comminuted proximal fibular fracture is also evident. IMPRESSION: Intraoperative fluoroscopic assistance provided during surgical fixation of proximal tibial fracture. Electronically Signed   By: Donavan Foil M.D.   On: 01/24/2018 14:52   Dg C-arm 1-60 Min  Result Date: 01/24/2018 CLINICAL DATA:  Fixation of fracture EXAM: DG C-ARM 61-120 MIN; LEFT TIBIA AND FIBULA - 2 VIEW COMPARISON:  CT 01/17/2018, radiograph 01/16/2017 FINDINGS: Nine low resolution intraoperative spot views of the left tibia and fibula. Total fluoroscopy time was 5 minutes 31 seconds. Images demonstrate surgical plate and multiple screw fixation of markedly comminuted proximal tibial fracture. A markedly comminuted proximal fibular fracture is also evident. IMPRESSION: Intraoperative fluoroscopic assistance provided during surgical fixation of proximal tibial fracture. Electronically Signed   By: Donavan Foil M.D.   On: 01/24/2018 14:52    Anti-infectives: Anti-infectives (From admission, onward)   Start     Dose/Rate Route Frequency Ordered Stop   01/24/18 2300  ceFAZolin (ANCEF) IVPB 2g/100 mL premix     2 g 200 mL/hr over 30 Minutes Intravenous Every 8 hours 01/24/18 1629 01/25/18 1433   01/24/18 1320  vancomycin (VANCOCIN) powder  Status:  Discontinued       As needed 01/24/18 1321 01/24/18 1525   01/24/18 1310  tobramycin (NEBCIN) powder  Status:  Discontinued       As needed 01/24/18 1311 01/24/18 1525   01/24/18 0800  ceFAZolin (ANCEF) 3 g in dextrose 5 % 50 mL IVPB     3 g 130 mL/hr over 30 Minutes Intravenous  Once 01/23/18 0921 01/24/18 1507   01/19/18 1800  ceFAZolin (ANCEF) 3 g in dextrose 5 % 50 mL IVPB     3 g 160 mL/hr  over 30 Minutes Intravenous To [facility]Stay [facility] 01/19/18 1720 01/20/18 1800   01/19/18 1200  ceFAZolin (ANCEF) 3 g in dextrose 5 % 50 mL IVPB     3 g 160 mL/hr over 30 Minutes Intravenous To ShortStay Surgical 01/19/18 1157 01/19/18 1202   01/17/18 0200  ceFAZolin (ANCEF) IVPB 2g/100 mL premix     2 g 200 mL/hr over 30 Minutes Intravenous Every 6 hours 01/16/18 2226 01/17/18 1430   01/16/18 1915  ceFAZolin (ANCEF) 3 g in dextrose 5 % 50 mL IVPB    Note to Pharmacy:  Anesthesia to give preop   3 g 130 mL/hr over 30 Minutes Intravenous  Once 01/16/18 1859 01/16/18 1922       Assessment/Plan Tibial plateau fracture - s/p ORIF, per ortho  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 Georgette Dover - POD#7 - incision c/d/i - patient may cleanse gently with soap and warm water R abdominal wall soft tissue mass - s/p panniculectomy 01/19/18 Dr. Georgette Dover -  POD#7 - prevena VAC Dc today with no issues.  Will leave wound open to air at this point. - sutures/staples to remain at least 2 weeks, plan to Dc on 3-29, next Friday. ABL anemia - hgb 8.9 post-op,continue to monitor Leukocytosis -  WBC increased to 16 yesterday from 11K.  He is afebrile.  Will recheck today just to make sure it's not continuing to increase.    FEN: reg diet VTE: SCD, lovenox 95 mg Q24h ID: Ancef 3/12, 3/13, 3/15-16;3/20  Dispo - leave staples/sututes until next Friday 3/29.  Check cbc.  Will follow    LOS: 10 days    Henreitta Cea , [facility] 01/26/2018, 8:35 AM Pager: [phone] Consults: [phone] Mon-Fri 7:00 am-4:30 pm Sat-Sun 7:00 am-11:30 am

## 2018-01-26 NOTE — H&P (Signed)
Physical Medicine and Rehabilitation Admission H&P    CC: Functional deficits due to left bicondylar tibial plateau fracture and morbid obesity    HPI: Tyler Rodier Sellarsis a 52 y.o.malewho was admitted on 01/16/18 after a fall of step ladder with subsequent left tibial plateau fracture. He was taken to OR for placement of external fixator for stabilization. He was evaluated by Dr. Marcelino Scot who recommended fixation once edema improved. General Surgery consulted for input on growing abdominal mass which was felt to be likely a massive lipoma. He was taken to OR on 3/15 for excision of right chest wall and right abdominal mass with panniculectomy by Dr. Georgette Dover. He was taken to OR for ORIF left bicondylar tibial plateau, Left tibial shaft and repair of lateral meniscus by Dr. Doreatha Martin. Post op to be NWB LLE with hinged brace in place--brace to be locked in extension at nights and unlocked during the day . He has had issues with pain control and Toradol was added yesterday with improvement in symptoms. Therapy ongoing and CIR recommended for follow up therapy.    Review of Systems  Constitutional: Negative for chills, fever and malaise/fatigue.  HENT: Negative for hearing loss and tinnitus.   Eyes: Negative for blurred vision and double vision.  Respiratory: Negative for cough and hemoptysis.   Cardiovascular: Positive for leg swelling (left ankle swells ). Negative for chest pain and palpitations.  Gastrointestinal: Positive for constipation. Negative for heartburn and nausea.  Genitourinary: Negative for dysuria and urgency.  Musculoskeletal: Negative for back pain, falls and myalgias.       Right knee instability.   Skin: Negative for itching and rash.  Neurological: Positive for weakness. Negative for dizziness and headaches.  Psychiatric/Behavioral: Negative for memory loss. The patient does not have insomnia.           Past Medical History:  Diagnosis Date  . Lipoma of abdominal  wall    Large lipoma         Past Surgical History:  Procedure Laterality Date  . EXTERNAL FIXATION LEG Left 01/16/2018   Procedure: LEFT KNEE SPANNING EXTERNAL FIXATION LEG;  Surgeon: Leandrew Koyanagi, MD;  Location: [facility];  Service: Orthopedics;  Laterality: Left;  . LIPOMA EXCISION Right 01/19/2018   Procedure: EXCISIONOF RIGHT CHEST WALL MASS, EXCISION OF ABDOMINAL MASS;  Surgeon: Donnie Mesa, MD;  Location: [place];  Service: General;  Laterality: Right;  . PANNICULECTOMY N/A 01/19/2018   Procedure: PANNICULECTOMY;  Surgeon: Donnie Mesa, MD;  [facility];  Service: General;  Laterality: N/A;         Family History  Problem Relation Age of Onset  . Diabetes Mother   . Diabetes Father      Social History:  Married. Independent PTA--has been out of work since 2014. Working on getting disability. Wife disable due to  Multiple issues. He reports that he has never smoked. He has never used smokeless tobacco. He reports that he does not drink alcohol or use drugs.       Allergies  Allergen Reactions  . Morphine And Related Nausea Only         Medications Prior to Admission  Medication Sig Dispense Refill  . docusate sodium (COLACE) 100 MG capsule Take 1 capsule (100 mg total) by mouth every 12 (twelve) hours. (Patient not taking: Reported on 01/16/2018) 60 capsule 0  . oxyCODONE (ROXICODONE) 5 MG immediate release tablet Take 1 tablet (5 mg total) by mouth every 4 (four) hours as  needed for severe pain. (Patient not taking: Reported on 01/16/2018) 7 tablet 0  . tamsulosin (FLOMAX) 0.4 MG CAPS capsule Take 1 capsule (0.4 mg total) by mouth daily after supper. (Patient not taking: Reported on 01/16/2018) 30 capsule 0    Drug Regimen Review  Drug regimen was reviewed and remains appropriate with no significant issues identified  Home: Home Living Family/patient expects to be discharged to:: Private residence Living Arrangements: (wife and he has cuatody of a  78 year old grand child) Available Help at Discharge: Family, Available 24 hours/day Type of Home: House Home Access: Stairs to enter [employer] of Steps: 7 Entrance Stairs-Rails: Right, Left, Can reach both Home Layout: One level Bathroom Shower/Tub: Gaffer, Chiropodist: Handicapped height Bathroom Accessibility: Yes Home Equipment: None  Lives With: Spouse, Other (Comment)   Functional History: Prior Function Level of Independence: Independent Comments: ADLs, IADLs, working, driving. Decreased activity tolerance  Functional Status:  Mobility: Bed Mobility Overal bed mobility: Needs Assistance Bed Mobility: Supine to Sit Supine to sit: Min assist, HOB elevated Sit to supine: Max assist, +2 for physical assistance General bed mobility comments: +rail, assist with LLE Transfers Overall transfer level: Needs assistance Equipment used: Rolling walker (2 wheeled) Transfers: Sit to/from Stand Sit to Stand: Min assist, From elevated surface Stand pivot transfers: Min assist General transfer comment: Increased time to stabilize initial standing balance. Ambulation/Gait Ambulation/Gait assistance: Min guard Ambulation Distance (Feet): 5 Feet Assistive device: Rolling walker (2 wheeled) Gait Pattern/deviations: Step-to pattern General Gait Details: Pt able to maintain NWB LLE for ambulation 5 feet bed to recliner. Gait velocity: decreased Gait velocity interpretation: Below normal speed for age/gender  ADL: ADL Overall ADL's : Needs assistance/impaired Eating/Feeding: Set up, Bed level Grooming: Set up, Bed level Upper Body Bathing: Moderate assistance, Bed level Lower Body Bathing: Total assistance, Bed level Upper Body Dressing : Moderate assistance, Bed level Lower Body Dressing: Total assistance, Bed level Lower Body Dressing Details (indicate cue type and reason): don socks Toilet Transfer Details (indicate cue type and  reason): Mod A +3 for simulated transfer to 3n1 (bed>recliner at bedside); 2 at top and one for LLE to maintain [place] Manipulation and Hygiene: Maximal assistance, +2 for physical assistance, Sit to/from stand Toileting - Clothing Manipulation Details (indicate cue type and reason): Max A for balance and to perform toilet hygiene Functional mobility during ADLs: Minimal assistance, Rolling walker(sit<>stand) General ADL Comments: education provded regarding AE for ADL completion for LB ADLs and for peri-care after toileting; pt return demonstrating use of reacher and sock aide with min verbal cues; issued level 4 theraband and instructed pt in bil UE HEP   Cognition: Cognition Overall Cognitive Status: Within Functional Limits for tasks assessed Orientation Level: Oriented X4 Cognition Arousal/Alertness: Awake/alert Behavior During Therapy: WFL for tasks assessed/performed Overall Cognitive Status: Within Functional Limits for tasks assessed  Physical Exam: Blood pressure (!) 137/91, pulse (!) 111, temperature 98.7 F (37.1 C), temperature source Oral, resp. rate (!) 24, height 5\' 8"  (1.727 m), weight (!) 147.9 kg (326 lb), SpO2 98 %. Physical Exam  Constitutional: He is oriented to person, place, and time.  Eyes: Pupils are equal, round, and reactive to light. Conjunctivae and EOM are normal.  Neck: Normal range of motion. Neck supple.  Respiratory: Effort normal and breath sounds normal. No stridor.  GI: Soft. Bowel sounds are normal. He exhibits no distension. There is no tenderness.  Obese. Lower abdominal incision with sutures in place> Min  amount of serosanguinous drainage on towel overlying incision.   Musculoskeletal: He exhibits edema. He exhibits no tenderness.  LLE limited by surgery and brace. Dry dressing on incision and Bledsoe brace in place.   Neurological: He is alert and oriented to person, place, and time. No cranial nerve deficit.  Speech clear.  Able to follow commands without difficulty. Moves BUE/RLE without difficulty.   Skin: Skin is warm and dry.  Psychiatric: He has a normal mood and affect. His behavior is normal. Judgment and thought content normal.    LabResultsLast48Hours  Results for orders placed or performed during the hospital encounter of 01/16/18 (from the past 48 hour(s))  CBC     Status: Abnormal   Collection Time: 01/25/18  6:41 AM  Result Value Ref Range   WBC 16.3 (H) 4.0 - 10.5 K/uL   RBC 3.66 (L) 4.22 - 5.81 MIL/uL   Hemoglobin 8.9 (L) 13.0 - 17.0 g/dL   HCT 29.0 (L) 39.0 - 52.0 %   MCV 79.2 78.0 - 100.0 fL   MCH 24.3 (L) 26.0 - 34.0 pg   MCHC 30.7 30.0 - 36.0 g/dL   RDW 16.1 (H) 11.5 - 15.5 %   Platelets 491 (H) 150 - 400 K/uL    Comment: Performed at [facility], [address], [place], [place] [address]  CBC     Status: Abnormal   Collection Time: 01/26/18  8:56 AM  Result Value Ref Range   WBC 11.3 (H) 4.0 - 10.5 K/uL   RBC 3.55 (L) 4.22 - 5.81 MIL/uL   Hemoglobin 8.5 (L) 13.0 - 17.0 g/dL   HCT 28.3 (L) 39.0 - 52.0 %   MCV 79.7 78.0 - 100.0 fL   MCH 23.9 (L) 26.0 - 34.0 pg   MCHC 30.0 30.0 - 36.0 g/dL   RDW 16.3 (H) 11.5 - 15.5 %   Platelets 484 (H) 150 - 400 K/uL    Comment: Performed at [facility], [place] [address], [place], [place] [address]      ImagingResults(Last48hours)  Dg Knee Left Port  Result Date: 01/24/2018 CLINICAL DATA:  Status post ORIF of proximal left tibial fracture EXAM: PORTABLE LEFT KNEE - 1-2 VIEW COMPARISON:  Intra op films from earlier in the same day. FINDINGS: Fixation sideplate is noted along the lateral aspect of the tibia. Multiple fixation screws are noted. Fracture fragments are stable in appearance when compared with the prior intraoperative exams. IMPRESSION: Status post ORIF of proximal left tibial fracture. Electronically Signed   By: Inez Catalina M.D.   On: 01/24/2018 16:30        Medical Problem  List and Plan: 1.  Functional deficits  secondary to tibial plateau fracture s/p ORIF and panniculectomy. NWB LLE with Bledsoe brace. Lock out brace when in bed.  2.  DVT Prophylaxis/Anticoagulation: Pharmaceutical: Lovenox 3. Pain Management: Continue Toradol for 5 total days. Oxycodone and robaxin prn.  4. Mood: LCSW to follow for evaluation and support.  5. Neuropsych: This patient is capable of making decisions on his own behalf. 6. Skin/Wound Care: Vac removed today. Will add dressing changes bid to abdominal wound.  7. Fluids/Electrolytes/Nutrition: Monitor I/O. Check lytes in am.  8. ABLA: Will recheck labs in am. 9. Leucocytosis: Likely reactive. Monitor for signs of infection 10. Obesity: Encourage weight loss and life style changes to help promote mobility and overall health.  11. Constipation: Will start Miralax bid.    Post Admission Physician Evaluation: 1. Functional deficits secondary  to tibial plateau fracture s/p ORIF and panniculectomy. 2. Patient is admitted to receive collaborative, interdisciplinary care between the physiatrist, rehab nursing staff, and therapy team. 3. Patient's level of medical complexity and substantial therapy needs in context of that medical necessity cannot be provided at a lesser intensity of care such as a SNF. 4. Patient has experienced substantial functional loss from his/her baseline which was documented above under the "Functional History" and "Functional Status" headings.  Judging by the patient's diagnosis, physical exam, and functional history, the patient has potential for functional progress which will result in measurable gains while on inpatient rehab.  These gains will be of substantial and practical use upon discharge  in facilitating mobility and self-care at the household level. 5. Physiatrist will provide 24 hour management of medical needs as well as oversight of the therapy plan/treatment and provide guidance as appropriate  regarding the interaction of the two. 6. The Preadmission Screening has been reviewed and patient status is unchanged unless otherwise stated above. 7. 24 hour rehab nursing will assist with bladder management, bowel management, safety, skin/wound care, disease management, medication administration, pain management and patient education  and help integrate therapy concepts, techniques,education, etc. 8. PT will assess and treat for/with: Lower extremity strength, range of motion, stamina, balance, functional mobility, safety, adaptive techniques and equipment, ortho precautions, pain control, activity tolerance.   Goals are: mod I. 9. OT will assess and treat for/with: ADL's, functional mobility, safety, upper extremity strength, adaptive techniques and equipment, pain mgt, ortho precautions.   Goals are: mod I. Therapy may proceed with showering this patient. 10. SLP will assess and treat for/with: n/a.  Goals are: n/a. 11. Case Management and Social Worker will assess and treat for psychological issues and discharge planning. 12. Team conference will be held weekly to assess progress toward goals and to determine barriers to discharge. 13. Patient will receive at least 3 hours of therapy per day at least 5 days per week. 14. ELOS: 10-14 days       15. Prognosis:  excellent     Meredith Staggers, MD, [facility] 01/26/2018  Bary Leriche, PA-C 01/26/2018

## 2018-01-26 NOTE — Consult Note (Signed)
Physical Medicine and Rehabilitation Consult   Reason for Consult: Functional deficits due to left bicondylar tibial plateau fracture and morbid obesity Referring Physician: Dr. Doreatha Martin.    HPI: Tyler Walters is a 52 y.o. male who was admitted on 01/16/18 after a fall of step ladder with subsequent left tibial plateau fracture. He was taken to OR for placement of external fixator for stabilization. He was evaluated by Dr. Marcelino Scot who recommended fixation once edema improved. General  Surgery consulted for input on growing abdominal mass which was felt to be likely a massive lipoma. He was taken to OR on  3/15 for excision of right chest wall and right abdominal mass with panniculectomy by Dr. Georgette Dover. He was taken to OR for ORIF left bicondylar tibial plateau,  Left tibial shaft and repair of lateral meniscus by Dr. Doreatha Martin. Post op to be NWB LLE with hinged brace in place--brace to be locked in extension at nights and unlocked during the day . He has had issues with pain control and Toradol was added yesterday with improvement in symptoms. Therapy ongoing and CIR recommended for follow up therapy.    ROS    Past Medical History:  Diagnosis Date  . Lipoma of abdominal wall    Large lipoma    Past Surgical History:  Procedure Laterality Date  . EXTERNAL FIXATION LEG Left 01/16/2018   Procedure: LEFT KNEE SPANNING EXTERNAL FIXATION LEG;  Surgeon: Leandrew Koyanagi, MD;  Location: [facility];  Service: Orthopedics;  Laterality: Left;  . LIPOMA EXCISION Right 01/19/2018   Procedure: EXCISIONOF RIGHT CHEST WALL MASS, EXCISION OF ABDOMINAL MASS;  Surgeon: Donnie Mesa, MD;  Location: [place];  Service: General;  Laterality: Right;  . PANNICULECTOMY N/A 01/19/2018   Procedure: PANNICULECTOMY;  Surgeon: Donnie Mesa, MD;  Location: [place];  Service: General;  Laterality: N/A;    History reviewed. No pertinent family history.    Social History:  reports that he has never smoked. He has never used  smokeless tobacco. He reports that he does not drink alcohol or use drugs.   Allergies  Allergen Reactions  . Morphine And Related Nausea Only    Medications Prior to Admission  Medication Sig Dispense Refill  . docusate sodium (COLACE) 100 MG capsule Take 1 capsule (100 mg total) by mouth every 12 (twelve) hours. (Patient not taking: Reported on 01/16/2018) 60 capsule 0  . oxyCODONE (ROXICODONE) 5 MG immediate release tablet Take 1 tablet (5 mg total) by mouth every 4 (four) hours as needed for severe pain. (Patient not taking: Reported on 01/16/2018) 7 tablet 0  . tamsulosin (FLOMAX) 0.4 MG CAPS capsule Take 1 capsule (0.4 mg total) by mouth daily after supper. (Patient not taking: Reported on 01/16/2018) 30 capsule 0    Home: Home Living Family/patient expects to be discharged to:: Private residence Living Arrangements: Spouse/significant other, Other relatives Available Help at Discharge: Family Type of Home: House Home Access: Stairs to enter Technical brewer of Steps: 7 Entrance Stairs-Rails: Can reach both [place]: One level Bathroom Shower/Tub: Gaffer, Chiropodist: Handicapped height Home Equipment: None  Functional History: Prior Function Level of Independence: Independent Comments: ADLs, IADLs, working, driving. Decreased activity tolerance Functional Status:  Mobility: Bed Mobility Overal bed mobility: Needs Assistance Bed Mobility: Supine to Sit Supine to sit: Min assist, HOB elevated Sit to supine: Max assist, +2 for physical assistance General bed mobility comments: +rail, assist with LLE Transfers Overall transfer level: Needs assistance  Equipment used: Rolling walker (2 wheeled) Transfers: Sit to/from Stand Sit to Stand: Min assist, From elevated surface Stand pivot transfers: Min assist General transfer comment: Increased time to stabilize initial standing balance. Ambulation/Gait Ambulation/Gait assistance: Min  guard Ambulation Distance (Feet): 5 Feet Assistive device: Rolling walker (2 wheeled) Gait Pattern/deviations: Step-to pattern General Gait Details: Pt able to maintain NWB LLE for ambulation 5 feet bed to recliner. Gait velocity: decreased Gait velocity interpretation: Below normal speed for age/gender    ADL: ADL Overall ADL's : Needs assistance/impaired Eating/Feeding: Set up, Bed level Grooming: Set up, Bed level Upper Body Bathing: Moderate assistance, Bed level Lower Body Bathing: Total assistance, Bed level Upper Body Dressing : Moderate assistance, Bed level Lower Body Dressing: Total assistance, Bed level Lower Body Dressing Details (indicate cue type and reason): don socks Toilet Transfer Details (indicate cue type and reason): Mod A +3 for simulated transfer to 3n1 (bed>recliner at bedside); 2 at top and one for LLE to maintain [place] Manipulation and Hygiene: Maximal assistance, +2 for physical assistance, Sit to/from stand Toileting - Clothing Manipulation Details (indicate cue type and reason): Max A for balance and to perform toilet hygiene Functional mobility during ADLs: Minimal assistance, Rolling walker(sit<>stand) General ADL Comments: education provded regarding AE for ADL completion for LB ADLs and for peri-care after toileting; pt return demonstrating use of reacher and sock aide with min verbal cues; issued level 4 theraband and instructed pt in bil UE HEP   Cognition: Cognition Overall Cognitive Status: Within Functional Limits for tasks assessed Orientation Level: Oriented X4 Cognition Arousal/Alertness: Awake/alert Behavior During Therapy: WFL for tasks assessed/performed Overall Cognitive Status: Within Functional Limits for tasks assessed  Blood pressure (!) 143/93, pulse (!) 109, temperature 98.8 F (37.1 C), temperature source Oral, resp. rate (!) 24, height 5\' 8"  (1.727 m), weight (!) 147.9 kg (326 lb), SpO2 95 %. Physical  Exam  Results for orders placed or performed during the hospital encounter of 01/16/18 (from the past 24 hour(s))  CBC     Status: Abnormal   Collection Time: 01/26/18  8:56 AM  Result Value Ref Range   WBC 11.3 (H) 4.0 - 10.5 K/uL   RBC 3.55 (L) 4.22 - 5.81 MIL/uL   Hemoglobin 8.5 (L) 13.0 - 17.0 g/dL   HCT 28.3 (L) 39.0 - 52.0 %   MCV 79.7 78.0 - 100.0 fL   MCH 23.9 (L) 26.0 - 34.0 pg   MCHC 30.0 30.0 - 36.0 g/dL   RDW 16.3 (H) 11.5 - 15.5 %   Platelets 484 (H) 150 - 400 K/uL   Dg Tibia/fibula Left  Result Date: 01/24/2018 CLINICAL DATA:  Fixation of fracture EXAM: DG C-ARM 61-120 MIN; LEFT TIBIA AND FIBULA - 2 VIEW COMPARISON:  CT 01/17/2018, radiograph 01/16/2017 FINDINGS: Nine low resolution intraoperative spot views of the left tibia and fibula. Total fluoroscopy time was 5 minutes 31 seconds. Images demonstrate surgical plate and multiple screw fixation of markedly comminuted proximal tibial fracture. A markedly comminuted proximal fibular fracture is also evident. IMPRESSION: Intraoperative fluoroscopic assistance provided during surgical fixation of proximal tibial fracture. Electronically Signed   By: Donavan Foil M.D.   On: 01/24/2018 14:52   Dg Knee Left Port  Result Date: 01/24/2018 CLINICAL DATA:  Status post ORIF of proximal left tibial fracture EXAM: PORTABLE LEFT KNEE - 1-2 VIEW COMPARISON:  Intra op films from earlier in the same day. FINDINGS: Fixation sideplate is noted along the lateral aspect of the tibia. Multiple fixation  screws are noted. Fracture fragments are stable in appearance when compared with the prior intraoperative exams. IMPRESSION: Status post ORIF of proximal left tibial fracture. Electronically Signed   By: Inez Catalina M.D.   On: 01/24/2018 16:30   Dg C-arm 1-60 Min  Result Date: 01/24/2018 CLINICAL DATA:  Fixation of fracture EXAM: DG C-ARM 61-120 MIN; LEFT TIBIA AND FIBULA - 2 VIEW COMPARISON:  CT 01/17/2018, radiograph 01/16/2017 FINDINGS: Nine  low resolution intraoperative spot views of the left tibia and fibula. Total fluoroscopy time was 5 minutes 31 seconds. Images demonstrate surgical plate and multiple screw fixation of markedly comminuted proximal tibial fracture. A markedly comminuted proximal fibular fracture is also evident. IMPRESSION: Intraoperative fluoroscopic assistance provided during surgical fixation of proximal tibial fracture. Electronically Signed   By: Donavan Foil M.D.   On: 01/24/2018 14:52   Dg C-arm 1-60 Min  Result Date: 01/24/2018 CLINICAL DATA:  Fixation of fracture EXAM: DG C-ARM 61-120 MIN; LEFT TIBIA AND FIBULA - 2 VIEW COMPARISON:  CT 01/17/2018, radiograph 01/16/2017 FINDINGS: Nine low resolution intraoperative spot views of the left tibia and fibula. Total fluoroscopy time was 5 minutes 31 seconds. Images demonstrate surgical plate and multiple screw fixation of markedly comminuted proximal tibial fracture. A markedly comminuted proximal fibular fracture is also evident. IMPRESSION: Intraoperative fluoroscopic assistance provided during surgical fixation of proximal tibial fracture. Electronically Signed   By: Donavan Foil M.D.   On: 01/24/2018 14:52   Dg C-arm 1-60 Min  Result Date: 01/24/2018 CLINICAL DATA:  Fixation of fracture EXAM: DG C-ARM 61-120 MIN; LEFT TIBIA AND FIBULA - 2 VIEW COMPARISON:  CT 01/17/2018, radiograph 01/16/2017 FINDINGS: Nine low resolution intraoperative spot views of the left tibia and fibula. Total fluoroscopy time was 5 minutes 31 seconds. Images demonstrate surgical plate and multiple screw fixation of markedly comminuted proximal tibial fracture. A markedly comminuted proximal fibular fracture is also evident. IMPRESSION: Intraoperative fluoroscopic assistance provided during surgical fixation of proximal tibial fracture. Electronically Signed   By: Donavan Foil M.D.   On: 01/24/2018 14:52    Assessment/Plan: Diagnosis: Morbidly obese male with debility and left tibial  plateau fx after fall from ladder. Also s/p large paniculectomy.  1. Does the need for close, 24 hr/day medical supervision in concert with the patient's rehab needs make it unreasonable for this patient to be served in a less intensive setting? Yes 2. Co-Morbidities requiring supervision/potential complications: wound care, pain 3. Due to bladder management, bowel management, safety, skin/wound care, disease management, medication administration, pain management and patient education, does the patient require 24 hr/day rehab nursing? Yes 4. Does the patient require coordinated care of a physician, rehab nurse, PT (1-2 hrs/day, 5 days/week) and OT (1-2 hrs/day, 5 days/week) to address physical and functional deficits in the context of the above medical diagnosis(es)? Yes Addressing deficits in the following areas: balance, endurance, locomotion, strength, transferring, bowel/bladder control, bathing, dressing, feeding, grooming, toileting and psychosocial support 5. Can the patient actively participate in an intensive therapy program of at least 3 hrs of therapy per day at least 5 days per week? Yes 6. The potential for patient to make measurable gains while on inpatient rehab is excellent 7. Anticipated functional outcomes upon discharge from inpatient rehab are modified independent and supervision  with PT, modified independent and supervision with OT, n/a with SLP. 8. Estimated rehab length of stay to reach the above functional goals is: 10-14 days 9. Anticipated D/C setting: Home 10. Anticipated post D/C treatments: [place] therapy 11.  Overall Rehab/Functional Prognosis: excellent  RECOMMENDATIONS: This patient's condition is appropriate for continued rehabilitative care in the following setting: CIR Patient has agreed to participate in recommended program. Yes Note that insurance prior authorization may be required for reimbursement for recommended care.  Comment: Rehab Admissions Coordinator to  follow up.  Thanks,  Meredith Staggers, MD, Mellody Drown    Bary Leriche, PA-C 01/26/2018

## 2018-01-27 ENCOUNTER — Inpatient Hospital Stay (HOSPITAL_COMMUNITY): Payer: Self-pay | Admitting: Occupational Therapy

## 2018-01-27 ENCOUNTER — Inpatient Hospital Stay (HOSPITAL_COMMUNITY): Payer: Self-pay | Admitting: Physical Therapy

## 2018-01-27 DIAGNOSIS — D171 Benign lipomatous neoplasm of skin and subcutaneous tissue of trunk: Secondary | ICD-10-CM

## 2018-01-27 LAB — CBC WITH DIFFERENTIAL/PLATELET
BASOS ABS: 0 10*3/uL (ref 0.0–0.1)
Basophils Relative: 0 %
Eosinophils Absolute: 0.3 10*3/uL (ref 0.0–0.7)
Eosinophils Relative: 3 %
HCT: 32.1 % — ABNORMAL LOW (ref 39.0–52.0)
HEMOGLOBIN: 9.8 g/dL — AB (ref 13.0–17.0)
LYMPHS PCT: 20 %
Lymphs Abs: 2.3 10*3/uL (ref 0.7–4.0)
MCH: 23.8 pg — AB (ref 26.0–34.0)
MCHC: 30.5 g/dL (ref 30.0–36.0)
MCV: 78.1 fL (ref 78.0–100.0)
MONO ABS: 1.5 10*3/uL — AB (ref 0.1–1.0)
Monocytes Relative: 13 %
NEUTROS PCT: 64 %
Neutro Abs: 7.5 10*3/uL (ref 1.7–7.7)
Platelets: 236 10*3/uL (ref 150–400)
RBC: 4.11 MIL/uL — AB (ref 4.22–5.81)
RDW: 16 % — ABNORMAL HIGH (ref 11.5–15.5)
WBC: 11.6 10*3/uL — AB (ref 4.0–10.5)

## 2018-01-27 LAB — COMPREHENSIVE METABOLIC PANEL
ALBUMIN: 2.4 g/dL — AB (ref 3.5–5.0)
ALK PHOS: 63 U/L (ref 38–126)
ALT: 14 U/L — ABNORMAL LOW (ref 17–63)
AST: 35 U/L (ref 15–41)
Anion gap: 11 (ref 5–15)
BUN: 15 mg/dL (ref 6–20)
CHLORIDE: 99 mmol/L — AB (ref 101–111)
CO2: 25 mmol/L (ref 22–32)
CREATININE: 0.98 mg/dL (ref 0.61–1.24)
Calcium: 8 mg/dL — ABNORMAL LOW (ref 8.9–10.3)
GFR calc non Af Amer: >60 mL/min (ref >60)
GLUCOSE: 101 mg/dL — AB (ref 65–99)
Potassium: 4.2 mmol/L (ref 3.5–5.1)
SODIUM: 135 mmol/L (ref 135–145)
Total Bilirubin: 1.2 mg/dL (ref 0.3–1.2)
Total Protein: 6.3 g/dL — ABNORMAL LOW (ref 6.5–8.1)

## 2018-01-27 MED ORDER — ENOXAPARIN SODIUM 100 MG/ML ~~LOC~~ SOLN
90.0000 mg | SUBCUTANEOUS | Status: DC
  Administered 2018-01-28 – 2018-02-03 (×8): 90 mg via SUBCUTANEOUS
  Filled 2018-01-27 (×9): qty 0.9

## 2018-01-27 NOTE — Progress Notes (Signed)
Tyler Walters is a 52 y.o. male 17-Aug-1966 [id number]  Subjective: No complaints or new problems. Slept OK. Pain reasonably controlled  Objective: Vital signs in last 24 hours: Temp:  [98.2 F (36.8 C)-98.7 F (37.1 C)] 98.2 F (36.8 C) (03/23 0510) Pulse Rate:  [106-111] 106 (03/23 0510) Resp:  [20] 20 (03/23 0510) BP: (137-163)/(90-92) 163/90 (03/23 0510) SpO2:  [97 %-100 %] 97 % (03/23 0510) Weight:  [184.2 kg (406 lb)-192.8 kg (425 lb)] 192.8 kg (425 lb) (03/23 0500) Weight change:  Last BM Date: 01/26/18  Intake/Output from previous day: 03/22 0701 - 03/23 0700 In: 360 [P.O.:360] Out: 1150 [Urine:1150]  Physical Exam General: No apparent distress    Lungs: Normal effort. Lungs clear to auscultation, no crackles or wheezes. Cardiovascular: Regular rate and rhythm, no edema Wounds: dressing clean, dry, intact. No signs of infection.  Lab Results: BMET    Component Value Date/Time   NA 135 01/27/2018 0616   K 4.2 01/27/2018 0616   CL 99 (L) 01/27/2018 0616   CO2 25 01/27/2018 0616   GLUCOSE 101 (H) 01/27/2018 0616   BUN 15 01/27/2018 0616   CREATININE 0.98 01/27/2018 0616   CALCIUM 8.0 (L) 01/27/2018 0616   GFRNONAA >60 01/27/2018 0616   GFRAA >60 01/27/2018 0616   CBC    Component Value Date/Time   WBC 11.6 (H) 01/27/2018 0616   RBC 4.11 (L) 01/27/2018 0616   HGB 9.8 (L) 01/27/2018 0616   HCT 32.1 (L) 01/27/2018 0616   PLT 236 01/27/2018 0616   MCV 78.1 01/27/2018 0616   MCH 23.8 (L) 01/27/2018 0616   MCHC 30.5 01/27/2018 0616   RDW 16.0 (H) 01/27/2018 0616   LYMPHSABS 2.3 01/27/2018 0616   MONOABS 1.5 (H) 01/27/2018 0616   EOSABS 0.3 01/27/2018 0616   BASOSABS 0.0 01/27/2018 0616   CBG's (last 3):  No results for input(s): GLUCAP in the last 72 hours. LFT's Lab Results  Component Value Date   ALT 14 (L) 01/27/2018   AST 35 01/27/2018   ALKPHOS 63 01/27/2018   BILITOT 1.2 01/27/2018    Studies/Results: No results found.  Medications:  I  have reviewed the patient's current medications. Scheduled Medications: . enoxaparin (LOVENOX) injection  70 mg Subcutaneous Q24H  . ketorolac  15 mg Intravenous Q6H  . polyethylene glycol  17 g Oral BID  . tamsulosin  0.4 mg Oral QPC supper   PRN Medications: acetaminophen, alum & mag hydroxide-simeth, bisacodyl, diphenhydrAMINE, diphenhydrAMINE, guaiFENesin-dextromethorphan, methocarbamol, oxyCODONE, polyethylene glycol, prochlorperazine **OR** prochlorperazine **OR** prochlorperazine, sodium phosphate, traMADol, traZODone  Assessment/Plan: Principal Problem:   Closed bicondylar fracture of left tibial plateau Active Problems:   Morbid obesity (HCC)   Lipoma of abdominal wall   Pannus, abdominal   1. Debility and decreased mobility from bicondylar tib plat fx due to fall PTA - s/p ext fixation, then ORIF - continue CIR therapy and pain mgmt as ongoing 2. MO s/p paniculectomy 3/15 - nutritional counseling ongoing 3. Lipoma s/p surgical excision  Length of stay, days: 1   Valerie A. Asa Lente, MD 01/27/2018, 10:22 AM

## 2018-01-27 NOTE — Evaluation (Signed)
Speech Language Pathology Assessment and Plan  Patient Details  Name: Tyler Walters MRN: [id number] Date of Birth: [birth year]  SLP Diagnosis:   N/ A Rehab Potential:  N/A ELOS:   N/A   Today's Date: 01/27/2018 SLP Individual Time: 1200-1230 SLP Individual Time Calculation (min): 30 min   Problem List:  Patient Active Problem List   Diagnosis Date Noted  . Closed fracture of left tibial plateau 01/26/2018  . Pannus, abdominal 01/19/2018  . Lipoma of abdominal wall   . Closed bicondylar fracture of left tibial plateau 01/16/2018  . Morbid obesity ([place]) 06/25/2009  . CELLULITIS AND ABSCESS OF TRUNK 06/25/2009   Past Medical History:  Past Medical History:  Diagnosis Date  . Lipoma of abdominal wall    Large lipoma   Past Surgical History:  Past Surgical History:  Procedure Laterality Date  . EXTERNAL FIXATION LEG Left 01/16/2018   Procedure: LEFT KNEE SPANNING EXTERNAL FIXATION LEG;  Surgeon: Leandrew Koyanagi, MD;  Location: [facility];  Service: Orthopedics;  Laterality: Left;  . EXTERNAL FIXATION REMOVAL Left 01/24/2018   Procedure: REMOVAL EXTERNAL FIXATION LEG;  Surgeon: Shona Needles, MD;  Location: [place];  Service: Orthopedics;  Laterality: Left;  both upper and lower leg involved with fixator  . LIPOMA EXCISION Right 01/19/2018   Procedure: EXCISIONOF RIGHT CHEST WALL MASS, EXCISION OF ABDOMINAL MASS;  Surgeon: Donnie Mesa, MD;  Location: [place];  Service: General;  Laterality: Right;  . ORIF TIBIA PLATEAU Left 01/24/2018   Procedure: OPEN REDUCTION INTERNAL FIXATION (ORIF) TIBIAL PLATEAU;  Surgeon: Shona Needles, MD;  Location: [place];  Service: Orthopedics;  Laterality: Left;  . PANNICULECTOMY N/A 01/19/2018   Procedure: PANNICULECTOMY;  Surgeon: Donnie Mesa, MD;  Location: [place];  Service: General;  Laterality: N/A;    Assessment / Plan / Recommendation Clinical Impression Tyler Walters is a 52 y.o. male who was admitted on 01/16/18 after a fall of step ladder with  subsequent left tibial plateau fracture. He was taken to OR for placement of external fixator for stabilization. He was evaluated by Dr. Marcelino Scot who recommended fixation once edema improved. General  Surgery consulted for input on growing abdominal mass which was felt to be likely a massive lipoma. He was taken to OR on  3/15 for excision of right chest wall and right abdominal mass with panniculectomy by Dr. Georgette Dover. He was taken to OR for ORIF left bicondylar tibial plateau,  Left tibial shaft and repair of lateral meniscus by Dr. Doreatha Martin. Post op to be NWB LLE with hinged brace in place--brace to be locked in extension at nights and unlocked during the day . He has had issues with pain control and Toradol was added yesterday with improvement in symptoms. Therapy ongoing and CIR recommended for follow up therapy.  Pt presents high cognitive linguistic ability and no impairment in swallow function, which is consistent with baseline. SLP administered subsections of the AFLA to assess higher level cognition in money/medictaion management and mental math problems, pt demonstrated independent ability. SLP assessed cognitive linguistic ability informally in complex conversation, pt demonstrated no deficits in speech, expressive/recpetive language, attention and recall. Pt presents with functional swallow abilities at most complex textural diet of regular textures and thin liquid via straw/cup and functional oral motor skills. Pt would not benefit from skilled ST services at this time due to high level of function as well as presenting at swallow and cognitive baseline.    Skilled Therapeutic Interventions  Evaluation only.  SLP Assessment  Patient does not need any further Speech Lanaguage Pathology Services    Recommendations  SLP Diet Recommendations: Thin Liquid Administration via: Cup;Straw Medication Administration: Whole meds with liquid Supervision: Patient able to self feed Patient destination:  Home Follow up Recommendations: None Equipment Recommended: None recommended by SLP    SLP Frequency    N/A  SLP Duration  SLP Intensity  SLP Treatment/Interventions  N/A   N/A    N/A   Pain Pain Assessment Pain Scale: Faces Pain Score: 4  Faces Pain Scale: Hurts little more Pain Type: Acute pain Pain Location: Leg Pain Orientation: Left Pain Descriptors / Indicators: Aching;Sore Pain Onset: On-going  Prior Functioning Cognitive/Linguistic Baseline: Within functional limits Type of Home: House  Lives With: Spouse;Other (Comment) Available Help at Discharge: Family;Available 24 hours/day;Available PRN/intermittently Vocation: Full time employment  Function:  Eating Eating   Modified Consistency Diet: No Eating Assist Level: No help, No cues           Cognition Comprehension Comprehension assist level: Follows complex conversation/direction with no assist  Expression   Expression assist level: Expresses complex ideas: With no assist  Social Interaction Social Interaction assist level: Interacts appropriately with others - No medications needed.  Problem Solving Problem solving assist level: Solves complex problems: Recognizes & self-corrects  Memory Memory assist level: Complete Independence: No helper   Short Term Goals: No short term goals set  Refer to Care Plan for Long Term Goals  Recommendations for other services: None   Discharge Criteria: Patient will be discharged from SLP if patient refuses treatment 3 consecutive times without medical reason, if treatment goals not met, if there is a change in medical status, if patient makes no progress towards goals or if patient is discharged from hospital.  The above assessment, treatment plan, treatment alternatives and goals were discussed and mutually agreed upon: by patient  Ozell Juhasz  [facility] 01/27/2018, 12:49 PM

## 2018-01-27 NOTE — Evaluation (Signed)
Occupational Therapy Assessment and Plan  Patient Details  Name: Tyler Walters MRN: [id number] Date of Birth: [birth year]  OT Diagnosis: acute pain and muscle weakness (generalized) Rehab Potential: Rehab Potential (ACUTE ONLY): Excellent ELOS: 5-7 days   Today's Date: 01/27/2018 OT Individual Time: 4098-1191 and 1300-1345 OT Individual Time Calculation (min): 45 min and 45 min  Problem List:  Patient Active Problem List   Diagnosis Date Noted  . Closed fracture of left tibial plateau 01/26/2018  . Pannus, abdominal 01/19/2018  . Lipoma of abdominal wall   . Closed bicondylar fracture of left tibial plateau 01/16/2018  . Morbid obesity ([place]) 06/25/2009  . CELLULITIS AND ABSCESS OF TRUNK 06/25/2009    Past Medical History:  Past Medical History:  Diagnosis Date  . Lipoma of abdominal wall    Large lipoma   Past Surgical History:  Past Surgical History:  Procedure Laterality Date  . EXTERNAL FIXATION LEG Left 01/16/2018   Procedure: LEFT KNEE SPANNING EXTERNAL FIXATION LEG;  Surgeon: Leandrew Koyanagi, MD;  Location: [facility];  Service: Orthopedics;  Laterality: Left;  . EXTERNAL FIXATION REMOVAL Left 01/24/2018   Procedure: REMOVAL EXTERNAL FIXATION LEG;  Surgeon: Shona Needles, MD;  Location: [place];  Service: Orthopedics;  Laterality: Left;  both upper and lower leg involved with fixator  . LIPOMA EXCISION Right 01/19/2018   Procedure: EXCISIONOF RIGHT CHEST WALL MASS, EXCISION OF ABDOMINAL MASS;  Surgeon: Donnie Mesa, MD;  Location: [place];  Service: General;  Laterality: Right;  . ORIF TIBIA PLATEAU Left 01/24/2018   Procedure: OPEN REDUCTION INTERNAL FIXATION (ORIF) TIBIAL PLATEAU;  Surgeon: Shona Needles, MD;  Location: [place];  Service: Orthopedics;  Laterality: Left;  . PANNICULECTOMY N/A 01/19/2018   Procedure: PANNICULECTOMY;  Surgeon: Donnie Mesa, MD;  Location: [place];  Service: General;  Laterality: N/A;    Assessment & Plan Clinical Impression: [name] is a  52 y.o. male who was admitted on 01/16/18 after a fall of step ladder with subsequent left tibial plateau fracture. He was taken to OR for placement of external fixator for stabilization. He was evaluated by Dr. Marcelino Scot who recommended fixation once edema improved. General  Surgery consulted for input on growing abdominal mass which was felt to be likely a massive lipoma. He was taken to OR on  3/15 for excision of right chest wall and right abdominal mass with panniculectomy by Dr. Georgette Dover. He was taken to OR for ORIF left bicondylar tibial plateau,  Left tibial shaft and repair of lateral meniscus by Dr. Doreatha Martin. Post op to be NWB LLE with hinged brace in place--brace to be locked in extension at nights and unlocked during the day . He has had issues with pain control and Toradol was added yesterday with improvement in symptoms. Therapy ongoing and CIR recommended for follow up therapy.  Patient transferred to CIR on 01/26/2018 .    Patient currently requires max with basic self-care skills secondary to muscle weakness and decreased standing balance and decreased balance strategies.  Prior to hospitalization, patient was fully independent, working full time.  Patient will benefit from skilled intervention to increase independence with basic self-care skills prior to discharge home with care partner.  Anticipate patient will require minimal physical assistance with LB dressing only, otherwise pt with be set up to mod I with self care and no further OT follow recommended.  OT - End of Session Activity Tolerance: Tolerates 10 - 20 min activity with multiple rests Endurance Deficit: Yes  Endurance Deficit Description: decreased OT Assessment Rehab Potential (ACUTE ONLY): Excellent OT Patient demonstrates impairments in the following area(s): Balance;Endurance;Motor;Pain OT Basic ADL's Functional Problem(s): Bathing;Dressing;Toileting OT Transfers Functional Problem(s): Toilet;Tub/Shower OT Additional  Impairment(s): None OT Plan OT Intensity: Minimum of 1-2 x/day, 45 to 90 minutes OT Frequency: 5 out of 7 days OT Duration/Estimated Length of Stay: 5-7 days OT Treatment/Interventions: Balance/vestibular training;Discharge planning;DME/adaptive equipment instruction;Functional mobility training;Self Care/advanced ADL retraining;Patient/family education;Therapeutic Exercise;UE/LE Strength taining/ROM;Therapeutic Activities;Pain management OT Self Feeding Anticipated Outcome(s): no goal OT Basic Self-Care Anticipated Outcome(s): set up with bathing and UB dressing, min A LB dressing OT Toileting Anticipated Outcome(s): mod I  OT Bathroom Transfers Anticipated Outcome(s): mod I to toilet  OT Recommendation Patient destination: Home Follow Up Recommendations: None Equipment Recommended: None recommended by OT   Skilled Therapeutic Intervention Visit 1:  Pain: pt reports minimal leg pain, just discomfort in abdominal area Pt seen for initial evaluation and ADL training. Pt was able to sit to EOB with S with rails, stood to RW and completed stand pivot to w/c with close S to min A while maintaining NWB on his LLE.  Pt sat at sink to complete UB self care. He was not able to don pants over feet due to LLE brace, limited R leg strength with lifting due to abdominal surgery. Pt requested AE to assist him with toileting. Will work on in next session. Discussed role of OT, OT POC, pt's goals, and ELOS.  Pt resting in w/c with all needs met.   Visit 2: Pain: 5/10 in abdominal region Pt seen this session to practice toilet transfers and solve toileting concerns.  A HD BSC placed over toilet and pt practiced ambulating with RW into bathroom. He needed steadying A as he was entering bathroom due to slope in floor, pt able to transfer on and off toilet with grab bars with supervision. Introduced 3 types of toilet aids. Pt opted to try green wand toilet aid vs. Tongs.  Pt practiced cleansing in a simulated  manner with paper to ensure he could reach. Discussed cleansing and clean up strategies with using the wand.  Pt ambulated back to w/c. Pt in chair with all needs met.  OT Evaluation Precautions/Restrictions  Precautions Precautions: Fall Required Braces or Orthoses: Other Brace/Splint Other Brace/Splint: L knee bledsoe brace, locked in extension during mobility.  Restrictions LLE Weight Bearing: Non weight bearing  Pain Pain Assessment Pain Scale: Faces Faces Pain Scale: Hurts little more Pain Type: Acute pain Pain Location: Leg Pain Orientation: Left Pain Onset: On-going Home Living/Prior [place] expects to be discharged to:: Private residence Living Arrangements: Spouse/significant other, Other relatives Available Help at Discharge: Family, Available 24 hours/day, Available PRN/intermittently Type of Home: House Home Access: Stairs to enter, Other (comment), Ramped entrance Entrance Stairs-Number of Steps: 7 Entrance Stairs-Rails: Right, Left, Can reach both Home Layout: One level Bathroom Shower/Tub: Gaffer, Chiropodist: Handicapped height Bathroom Accessibility: Yes  Lives With: Spouse, Other (Comment) Prior Function Level of Independence: Independent with basic ADLs, Independent with homemaking with ambulation, Independent with transfers, Independent with gait  Able to Take Stairs?: Yes Driving: Yes Vocation: Full time employment 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 Decreased activity tolerance ADL  refer to functional navigator Vision Baseline Vision/History: Wears glasses Wears Glasses: At all times Patient Visual Report: No change from baseline Vision Assessment?:  No apparent visual deficits Perception  Perception: Within Functional  Limits Praxis Praxis: Intact Cognition Overall Cognitive Status: Within Functional Limits for tasks assessed Arousal/Alertness: Awake/alert Orientation Level: Person;Place;Situation Person: Oriented Place: Oriented Situation: Oriented Year: 2019 Month: March Day of Week: Correct Memory: Appears intact Immediate Memory Recall: Sock;Blue;Bed Memory Recall: Sock;Blue;Bed Memory Recall Sock: Without Cue Memory Recall Blue: Without Cue Memory Recall Bed: With Cue Awareness: Appears intact Problem Solving: Appears intact Safety/Judgment: Appears intact Sensation Sensation Light Touch: Appears Intact Stereognosis: Appears Intact Hot/Cold: Appears Intact Proprioception: Appears Intact Coordination Gross Motor Movements are Fluid and Coordinated: Yes Fine Motor Movements are Fluid and Coordinated: Yes Motor  Motor Motor: Within Functional Limits - L hip flexor weakness Mobility  Bed Mobility Bed Mobility: Supine to Sit;Sit to Supine Supine to Sit: 5: Supervision Supine to Sit Details: Verbal cues for technique;Verbal cues for precautions/safety Sit to Supine: 5: Supervision Sit to Supine - Details: Verbal cues for technique;Verbal cues for precautions/safety Transfers Sit to Stand: 4: Min guard Stand to Sit: 4: Min guard  Trunk/Postural Assessment  Cervical Assessment Cervical Assessment: Within Functional Limits Thoracic Assessment Thoracic Assessment: Within Functional Limits Lumbar Assessment Lumbar Assessment: Within Functional Limits Postural Control Postural Control: Within Functional Limits  Balance Balance Balance Assessed: Yes Static Sitting Balance Static Sitting - Balance Support: No upper extremity supported;Feet supported Static Sitting - Level of Assistance: 5: Stand by assistance Dynamic Sitting Balance Dynamic Sitting - Balance Support: No upper extremity supported;Feet supported Dynamic Sitting - Level of Assistance: 5: Stand by assistance Static  Standing Balance Static Standing - Balance Support: During functional activity;Bilateral upper extremity supported Static Standing - Level of Assistance: 5: Stand by assistance Dynamic Standing Balance Dynamic Standing - Balance Support: No upper extremity supported;During functional activity Dynamic Standing - Level of Assistance: 4: Min assist Extremity/Trunk Assessment RUE Assessment RUE Assessment: Within Functional Limits LUE Assessment LUE Assessment: Within Functional Limits   See Function Navigator for Current Functional Status.   Refer to Care Plan for Long Term Goals  Recommendations for other services: None    Discharge Criteria: Patient will be discharged from OT if patient refuses treatment 3 consecutive times without medical reason, if treatment goals not met, if there is a change in medical status, if patient makes no progress towards goals or if patient is discharged from hospital.  The above assessment, treatment plan, treatment alternatives and goals were discussed and mutually agreed upon: by patient  [facility] 01/27/2018, 3:28 PM

## 2018-01-27 NOTE — Evaluation (Signed)
Physical Therapy Assessment and Plan  Patient Details  Name: Tyler Walters MRN: [id number] Date of Birth: 02-Feb-1966  PT Diagnosis: Difficulty walking, Muscle weakness and Pain in joint Rehab Potential: Excellent ELOS: 5-7 days   Today's Date: 01/27/2018 PT Individual Time: 1916-6060 PT Individual Time Calculation (min): 70 min    Problem List:  Patient Active Problem List   Diagnosis Date Noted  . Closed fracture of left tibial plateau 01/26/2018  . Pannus, abdominal 01/19/2018  . Lipoma of abdominal wall   . Closed bicondylar fracture of left tibial plateau 01/16/2018  . Morbid obesity ([place]) 06/25/2009  . CELLULITIS AND ABSCESS OF TRUNK 06/25/2009    Past Medical History:  Past Medical History:  Diagnosis Date  . Lipoma of abdominal wall    Large lipoma   Past Surgical History:  Past Surgical History:  Procedure Laterality Date  . EXTERNAL FIXATION LEG Left 01/16/2018   Procedure: LEFT KNEE SPANNING EXTERNAL FIXATION LEG;  Surgeon: Leandrew Koyanagi, MD;  Location[facility];  Service: Orthopedics;  Laterality: Left;  . EXTERNAL FIXATION REMOVAL Left 01/24/2018   Procedure: REMOVAL EXTERNAL FIXATION LEG;  Surgeon: Shona Needles, MD;  Location: [place];  Service: Orthopedics;  Laterality: Left;  both upper and lower leg involved with fixator  . LIPOMA EXCISION Right 01/19/2018   Procedure: EXCISIONOF RIGHT CHEST WALL MASS, EXCISION OF ABDOMINAL MASS;  Surgeon: Donnie Mesa, MD;  Location: [place];  Service: General;  Laterality: Right;  . ORIF TIBIA PLATEAU Left 01/24/2018   Procedure: OPEN REDUCTION INTERNAL FIXATION (ORIF) TIBIAL PLATEAU;  Surgeon: Shona Needles, MD;  Location: [place];  Service: Orthopedics;  Laterality: Left;  . PANNICULECTOMY N/A 01/19/2018   Procedure: PANNICULECTOMY;  Surgeon: Donnie Mesa, MD;  Location: [place];  Service: General;  Laterality: N/A;    Assessment & Plan Clinical Impression: Patient is a 52 y.o.malewho was admitted on 01/16/18 after a fall  of step ladder with subsequent left tibial plateau fracture. He was taken to OR for placement of external fixator for stabilization. He was evaluated by Dr. Marcelino Scot who recommended fixation once edema improved. General Surgery consulted for input on growing abdominal mass which was felt to be likely a massive lipoma. He was taken to OR on 3/15 for excision of right chest wall and right abdominal mass with panniculectomy by Dr. Georgette Dover. He was taken to OR for ORIF left bicondylar tibial plateau, Left tibial shaft and repair of lateral meniscus by Dr. Doreatha Martin. Post op to be NWB LLE with hinged brace in place--brace to be locked in extension at nights and unlocked during the day . He has had issues with pain control and Toradol was added yesterday with improvement in symptoms. Therapy ongoing and CIR recommended for follow up therapy. Patient transferred to CIR on 01/26/2018 .   Patient currently requires min with mobility secondary to muscle weakness and muscle joint tightness, decreased cardiorespiratoy endurance and decreased standing balance, decreased balance strategies and difficulty maintaining precautions.  Prior to hospitalization, patient was independent  with mobility and lived with Spouse, Other (Comment) in a House home.  Home access is 7Stairs to enter.  Patient will benefit from skilled PT intervention to maximize safe functional mobility, minimize fall risk and decrease caregiver burden for planned discharge home with intermittent assist. Wife able to be present in home 24/7 but cares for grandchildren during day and unable to be physically present for 24/7 supervision. Anticipate patient will benefit from follow up [place] at discharge.  PT - End of Session Activity Tolerance: Tolerates < 10 min activity, no significant change in vital signs Endurance Deficit: Yes Endurance Deficit Description: decreased PT Assessment Rehab Potential (ACUTE/IP ONLY): Excellent PT Barriers to Discharge: Inaccessible  home environment PT Barriers to Discharge Comments: 7 steps to enter house, family/friends planning to build ramp  PT Patient demonstrates impairments in the following area(s): Balance;Endurance;Edema;Pain;Safety PT Transfers Functional Problem(s): Bed Mobility;Bed to Chair;Car;Floor;Furniture PT Locomotion Functional Problem(s): Ambulation;Wheelchair Mobility;Stairs PT Plan PT Intensity: Minimum of 1-2 x/day ,45 to 90 minutes PT Frequency: 5 out of 7 days PT Duration Estimated Length of Stay: 5-7 days PT Treatment/Interventions: Visual/perceptual remediation/compensation;Stair training;Pain management;Disease management/prevention;Ambulation/gait training;Balance/vestibular training;DME/adaptive equipment instruction;Therapeutic Activities;Patient/family education;Wheelchair propulsion/positioning;Therapeutic Exercise;Psychosocial support;Cognitive remediation/compensation;Community reintegration;Functional mobility training;Skin care/wound management;UE/LE Strength taining/ROM;UE/LE Coordination activities;Splinting/orthotics;Neuromuscular re-education;Discharge planning PT Transfers Anticipated Outcome(s): Mod I  PT Locomotion Anticipated Outcome(s): Mod I w/c and gait short distance PT Recommendation Follow Up Recommendations: Home health PT Patient destination: Home Equipment Recommended: To be determined  Skilled Therapeutic Intervention  Pt in w/c and agreeable to therapy, pain as detailed below in L knee but tolerable. Pt instructed patient in PT Evaluation and initiated treatment intervention; see below for results. Pt limited by fatigue and endurance deficits during session, required frequent brief rest breaks but eager to participate and improve endurance. Pt educated patient in [place], rehab potential, rehab goals, and discharge recommendations. Specifically discussed home access barriers including 7 steps. Pt states he bumped up the steps on his bottom when he dislocated his knee last  year, cautioned pt on negotiating steps this way 2/2 safety concerns and risk of fall when getting on and off ground. Pt stated he already has lumbar at his house to build a ramp as they were planning to build one for his wife when she was injured. He plans to call family/friends to work on building ramp for discharge. He owns his own [employer], [employer] and states the ramp will be build in time for discharge. Returned to room and ended session in w/c, call bell within reach and all needs met. Replaced 1 elevating leg rest to allow for increased room for L foot to be supported in knee brace.   PT Evaluation Precautions/Restrictions Precautions Precautions: Fall Required Braces or Orthoses: Other Brace/Splint Other Brace/Splint: L knee bledsoe brace, locked in extension during mobility.  Restrictions Weight Bearing Restrictions: Yes LLE Weight Bearing: Non weight bearing Pain Pain Assessment Pain Scale: 0-10 Pain Score: 4  Pain Type: Acute pain Pain Location: Leg Pain Orientation: Left Pain Descriptors / Indicators: Aching;Sore Pain Frequency: Intermittent Pain Onset: On-going Patients Stated Pain Goal: 2 Pain Intervention(s): Medication (See eMAR) Home Living/Prior Functioning Home Living Available Help at Discharge: Family;Available 24 hours/day;Available PRN/intermittently Type of Home: House Home Access: Stairs to enter;Other (comment);Ramped entrance(family/friends working on building ramp, already have lumbar needed at the house) Technical brewer of Steps: 7 Entrance Stairs-Rails: Right;Left;Can reach both [place]: One level Bathroom Shower/Tub: Walk-in shower;Tub/shower unit Bathroom Toilet: Handicapped height Bathroom Accessibility: Yes  Lives With: Spouse;Other (Comment) Prior Function Level of Independence: Independent with basic ADLs;Independent with homemaking with ambulation;Independent with transfers;Independent with gait  Able to Take Stairs?:  Yes Driving: Yes Vocation: Full time employment Vocation Requirements: owns Copywriter, advertising, does both office and hands-on jobs, but able to hand off physical responsibilities Leisure: Hobbies-yes (Comment) Vision/Perception  Perception Perception: Within Functional Limits Praxis Praxis: Intact  Cognition Overall Cognitive Status: Within Functional Limits for tasks assessed Arousal/Alertness: Awake/alert Orientation Level: Oriented X4 Memory: Appears intact Awareness: Appears  intact Problem Solving: Appears intact Safety/Judgment: Appears intact Sensation Sensation Light Touch: Appears Intact Proprioception: Appears Intact Coordination Gross Motor Movements are Fluid and Coordinated: Yes Fine Motor Movements are Fluid and Coordinated: Yes Motor  Motor Motor: Within Functional Limits  Mobility Bed Mobility Bed Mobility: Supine to Sit;Sit to Supine Supine to Sit: 5: Supervision Supine to Sit Details: Verbal cues for technique;Verbal cues for precautions/safety Sit to Supine: 5: Supervision Sit to Supine - Details: Verbal cues for technique;Verbal cues for precautions/safety Transfers Transfers: Yes Sit to Stand: 4: Min guard Stand to Sit: 4: Min guard Stand Pivot Transfers: 4: Min guard Locomotion  Ambulation Ambulation: Yes Ambulation/Gait Assistance: 4: Min guard Ambulation Distance (Feet): 40 Feet Assistive device: Rolling walker Gait Gait: Yes Gait Pattern: Step-to pattern Gait velocity: decreased Stairs / Additional Locomotion Stairs: No Architect: Yes Wheelchair Assistance: 5: Investment banker, operational Details: Verbal cues for technique;Verbal cues for Information systems manager: Both upper extremities Wheelchair Parts Management: Needs assistance Distance: 150'  Trunk/Postural Assessment  Cervical Assessment Cervical Assessment: Within Functional Limits Thoracic Assessment Thoracic Assessment:  Within Functional Limits Lumbar Assessment Lumbar Assessment: Within Functional Limits Postural Control Postural Control: Within Functional Limits  Balance Balance Balance Assessed: Yes Static Sitting Balance Static Sitting - Balance Support: No upper extremity supported;Feet supported Static Sitting - Level of Assistance: 5: Stand by assistance Dynamic Sitting Balance Dynamic Sitting - Balance Support: No upper extremity supported;Feet supported Dynamic Sitting - Level of Assistance: 5: Stand by assistance Static Standing Balance Static Standing - Balance Support: During functional activity;Bilateral upper extremity supported Static Standing - Level of Assistance: 5: Stand by assistance Dynamic Standing Balance Dynamic Standing - Balance Support: No upper extremity supported;During functional activity Dynamic Standing - Level of Assistance: 4: Min assist Extremity Assessment  RLE Assessment RLE Assessment: Within Functional Limits LLE Assessment LLE Assessment: Exceptions to WFL(hip and knee ROM limited 2/2 pain, knee PROM 0-75 deg and painful at end range, 2/5 hip and knee ms, 5/5 ankle) LLE AROM (degrees) Overall AROM Left Lower Extremity: Due to pain;Deficits LLE PROM (degrees) Overall PROM Left Lower Extremity: Deficits;Due to pain LLE Strength LLE Overall Strength: Deficits;Due to pain   See Function Navigator for Current Functional Status.   Refer to Care Plan for Long Term Goals  Recommendations for other services: None   Discharge Criteria: Patient will be discharged from PT if patient refuses treatment 3 consecutive times without medical reason, if treatment goals not met, if there is a change in medical status, if patient makes no progress towards goals or if patient is discharged from hospital.  The above assessment, treatment plan, treatment alternatives and goals were discussed and mutually agreed upon: by patient  Eddie Payette K Arnette 01/27/2018, 12:01 PM

## 2018-01-28 ENCOUNTER — Other Ambulatory Visit: Payer: Self-pay

## 2018-01-28 ENCOUNTER — Inpatient Hospital Stay (HOSPITAL_COMMUNITY): Payer: Self-pay

## 2018-01-28 NOTE — Progress Notes (Signed)
Occupational Therapy Session Note  Patient Details  Name: ADAL SERENO MRN: [id number] Date of Birth: 08-09-1966  Today's Date: 01/28/2018 OT Individual Time: 0930-1000 OT Individual Time Calculation (min): 30 min    Short Term Goals: Week 1:  OT Short Term Goal 1 (Week 1): STGs = LTGs  Skilled Therapeutic Interventions/Progress Updates:    1:1. Pt requsting to toilet as feeling constipated. Pt ambulates to [facility] over toielt with touching A and mod A to reciver from posterior LOB when hopping up small ramp to bathroom. Pt trnasfers onto toilet with MIN A and Vc for use of grab bar/RW management. Pt reporting pain when bearing down to have BM and OT notices bloody drainage coming leaking onto floor. RN notified. Pt uses toilet wand to wipe buttock with A for thorough hygiene d/t time mangment. OT provides A for clothing management. And pt completes SPT with RW back to w/c. Exited session with pt grooming at sink and wife present for supervision and set up of grooming items.   Therapy Documentation Precautions:  Precautions Precautions: Fall Required Braces or Orthoses: Other Brace/Splint Other Brace/Splint: L knee bledsoe brace, locked in extension during mobility.  Restrictions Weight Bearing Restrictions: Yes LLE Weight Bearing: Non weight bearing General:   Vital Signs:   See Function Navigator for Current Functional Status.   Therapy/Group: Individual Therapy  Tonny Branch 01/28/2018, 10:33 AM

## 2018-01-28 NOTE — Progress Notes (Signed)
Tyler Walters is a 52 y.o. male 1966/05/27 [id number]  Subjective: Feels constipated. Pain reasonably controlled, "better than my usual everyday pain"  Objective: Vital signs in last 24 hours: Temp:  [98.3 F (36.8 C)-98.7 F (37.1 C)] 98.7 F (37.1 C) (03/24 0606) Pulse Rate:  [108-111] 108 (03/24 0606) Resp:  [16-18] 18 (03/24 0606) BP: (148-151)/(92-93) 148/92 (03/24 0606) SpO2:  [98 %] 98 % (03/24 0606) Weight change:  Last BM Date: 01/26/18  Intake/Output from previous day: 03/23 0701 - 03/24 0700 In: 960 [P.O.:960] Out: 800 [Urine:800]  Physical Exam General: No apparent distress    Lungs: Normal effort. Lungs clear to auscultation, no crackles or wheezes. Cardiovascular: Regular rate and rhythm, no edema Wounds: abd dressing clean, dry, intact. No signs of infection.  Lab Results: BMET    Component Value Date/Time   NA 135 01/27/2018 0616   K 4.2 01/27/2018 0616   CL 99 (L) 01/27/2018 0616   CO2 25 01/27/2018 0616   GLUCOSE 101 (H) 01/27/2018 0616   BUN 15 01/27/2018 0616   CREATININE 0.98 01/27/2018 0616   CALCIUM 8.0 (L) 01/27/2018 0616   GFRNONAA >60 01/27/2018 0616   GFRAA >60 01/27/2018 0616   CBC    Component Value Date/Time   WBC 11.6 (H) 01/27/2018 0616   RBC 4.11 (L) 01/27/2018 0616   HGB 9.8 (L) 01/27/2018 0616   HCT 32.1 (L) 01/27/2018 0616   PLT 236 01/27/2018 0616   MCV 78.1 01/27/2018 0616   MCH 23.8 (L) 01/27/2018 0616   MCHC 30.5 01/27/2018 0616   RDW 16.0 (H) 01/27/2018 0616   LYMPHSABS 2.3 01/27/2018 0616   MONOABS 1.5 (H) 01/27/2018 0616   EOSABS 0.3 01/27/2018 0616   BASOSABS 0.0 01/27/2018 0616   CBG's (last 3):  No results for input(s): GLUCAP in the last 72 hours. LFT's Lab Results  Component Value Date   ALT 14 (L) 01/27/2018   AST 35 01/27/2018   ALKPHOS 63 01/27/2018   BILITOT 1.2 01/27/2018    Studies/Results: No results found.  Medications:  I have reviewed the patient's current medications. Scheduled  Medications: . enoxaparin (LOVENOX) injection  90 mg Subcutaneous Q24H  . ketorolac  15 mg Intravenous Q6H  . polyethylene glycol  17 g Oral BID  . tamsulosin  0.4 mg Oral QPC supper   PRN Medications: acetaminophen, alum & mag hydroxide-simeth, bisacodyl, diphenhydrAMINE, diphenhydrAMINE, guaiFENesin-dextromethorphan, methocarbamol, oxyCODONE, polyethylene glycol, prochlorperazine **OR** prochlorperazine **OR** prochlorperazine, sodium phosphate, traMADol, traZODone  Assessment/Plan: Principal Problem:   Closed bicondylar fracture of left tibial plateau Active Problems:   Morbid obesity (HCC)   Lipoma of abdominal wall   Pannus, abdominal   1. Debility and decreased mobility from bicondylar tib plat fx due to fall PTA - s/p ext fixation, then ORIF - continue CIR therapy and pain mgmt as ongoing 2. MO s/p paniculectomy 3/15 - nutritional counseling ongoing - dressing care as per orders 3. Lipoma s/p surgical excision 4. Constipation due to decreased mobility and narcotics - miralax + Duc supp prn  Length of stay, days: 2   Nashaly Dorantes A. Asa Lente, MD 01/28/2018, 11:38 AM

## 2018-01-29 ENCOUNTER — Inpatient Hospital Stay (HOSPITAL_COMMUNITY): Payer: Self-pay

## 2018-01-29 ENCOUNTER — Inpatient Hospital Stay (HOSPITAL_COMMUNITY): Payer: Medicaid Other

## 2018-01-29 ENCOUNTER — Inpatient Hospital Stay (HOSPITAL_COMMUNITY): Payer: Self-pay | Admitting: Occupational Therapy

## 2018-01-29 ENCOUNTER — Inpatient Hospital Stay (HOSPITAL_COMMUNITY): Payer: Medicaid Other | Admitting: Physical Therapy

## 2018-01-29 LAB — CBC
HCT: 30.8 % — ABNORMAL LOW (ref 39.0–52.0)
HEMOGLOBIN: 9.3 g/dL — AB (ref 13.0–17.0)
MCH: 23.5 pg — ABNORMAL LOW (ref 26.0–34.0)
MCHC: 30.2 g/dL (ref 30.0–36.0)
MCV: 78 fL (ref 78.0–100.0)
PLATELETS: 604 10*3/uL — AB (ref 150–400)
RBC: 3.95 MIL/uL — AB (ref 4.22–5.81)
RDW: 15.9 % — ABNORMAL HIGH (ref 11.5–15.5)
WBC: 11.3 10*3/uL — AB (ref 4.0–10.5)

## 2018-01-29 MED ORDER — IOPAMIDOL (ISOVUE-370) INJECTION 76%
INTRAVENOUS | Status: AC
  Filled 2018-01-29: qty 100

## 2018-01-29 MED ORDER — PSYLLIUM 95 % PO PACK
1.0000 | PACK | Freq: Every day | ORAL | Status: DC
  Administered 2018-01-29: 1 via ORAL
  Filled 2018-01-29 (×7): qty 1

## 2018-01-29 MED ORDER — ENOXAPARIN (LOVENOX) PATIENT EDUCATION KIT
PACK | Freq: Once | Status: AC
  Administered 2018-01-30: 02:00:00
  Filled 2018-01-29: qty 1

## 2018-01-29 NOTE — Progress Notes (Signed)
Refuses scheduled miralax, Friday and Saturday at HS. Complaining of constipation and feeling tight in abd. Patient Took miralax tonight along with warm prune juice. Abd distended with hypoactive bowel sounds. Reports normal pattern 2-3 times/week. PRN robaxin and ultram given at 2235. Getting IV toradol every 6 hours.  Also complaining of "sinus" issues, reporting thick secretions. Feeling down R/T current situation, "might" be interested in trying antidepressant. Ace wrap to LLE, too tight per patient and has moved too far down leg. Cleansed and Rewrapped LLE. 2 small areas on shin oozing serosanguinous drainage. Bruised area to medial aspect at knee incision. LLE edema, declines ice pack, "I like to use after therapies.". Doesn't want heels elevated, "puts pressure on my knee." Educated R/T pressure sores.Patrici Ranks A

## 2018-01-29 NOTE — Progress Notes (Signed)
[facility] PHYSICAL MEDICINE & REHABILITATION     PROGRESS NOTE    Subjective/Complaints: Had a pretty good weekend. Wound oozing a bit, especially when he was bearing down to empty bowels. Anxious to get home and embarrassed about needing care in hospital.   ROS: Patient denies fever, rash, sore throat, blurred vision, nausea, vomiting, diarrhea, cough, shortness of breath or chest pain, joint or back pain, headache, or mood change.   Objective: Vital Signs: Blood pressure (!) 140/92, pulse 93, temperature 98.2 F (36.8 C), temperature source Oral, resp. rate 18, height 5\' 8"  (1.727 m), weight (!) 192.8 kg (425 lb), SpO2 100 %. No results found. Recent Labs    01/26/18 0856 01/27/18 0616  WBC 11.3* 11.6*  HGB 8.5* 9.8*  HCT 28.3* 32.1*  PLT 484* 236   Recent Labs    01/27/18 0616  NA 135  K 4.2  CL 99*  GLUCOSE 101*  BUN 15  CREATININE 0.98  CALCIUM 8.0*   CBG (last 3)  No results for input(s): GLUCAP in the last 72 hours.  Wt Readings from Last 3 Encounters:  01/27/18 (!) 192.8 kg (425 lb)  01/24/18 (!) 147.9 kg (326 lb)    Physical Exam:  Constitutional: No distress . Vital signs reviewed. HEENT: EOMI, oral membranes moist Cardiovascular: RRR without murmur. No JVD    Respiratory: CTA Bilaterally without wheezes or rales. Normal effort    GI: BS +, non-tender, non-distended, abd incision with spots of s/s drainage, stpales in place  Musculoskeletal: He exhibits2+ LLE edema. He exhibits notenderness. LLE limited by surgery and brace. Dry dressing on incision and Bledsoe brace slid down by 2" Neurological: He isalertand oriented to person, place, and time. Nocranial nerve deficit. Speech clear. Able to follow commands without difficulty. Moves BUE/RLE 4-5/5.  LLE grossly intact but limited by ortho Skin: Skin iswarmand dry.  Psychiatric: He has anormal mood and affect. Hisbehavior is normal.Judgmentand thought contentnormal.      Assessment/Plan: 1. Functional deficits secondary to left tibial plateau fx, morbid obesity which require 3+ hours per day of interdisciplinary therapy in a comprehensive inpatient rehab setting. Physiatrist is providing close team supervision and 24 hour management of active medical problems listed below. Physiatrist and rehab team continue to assess barriers to discharge/monitor patient progress toward functional and medical goals.  Function:  Bathing Bathing position   Position: Wheelchair/chair at sink  Bathing parts Body parts bathed by patient: Right arm, Left arm, Chest, Front perineal area, Right upper leg, Left upper leg Body parts bathed by helper: Buttocks, Right lower leg, Left lower leg, Back  Bathing assist        Upper Body Dressing/Undressing Upper body dressing   What is the patient wearing?: Pull over shirt/dress     Pull over shirt/dress - Perfomed by patient: Thread/unthread right sleeve, Thread/unthread left sleeve, Put head through opening, Pull shirt over trunk          Upper body assist Assist Level: Set up      Lower Body Dressing/Undressing Lower body dressing   What is the patient wearing?: Pants, Non-skid slipper socks     Pants- Performed by patient: Pull pants up/down Pants- Performed by helper: Thread/unthread right pants leg, Thread/unthread left pants leg   Non-skid slipper socks- Performed by helper: Don/doff right sock, Don/doff left sock                  Lower body assist        Toileting Toileting  Toileting steps completed by patient: Adjust clothing prior to toileting, Adjust clothing after toileting(wearing hospital gown) Toileting steps completed by helper: Performs perineal hygiene Toileting Assistive Devices: Grab bar or rail, Toilet aid  Toileting assist Assist level: Touching or steadying assistance (Pt.75%)   Transfers Chair/bed transfer   Chair/bed transfer method: Stand pivot Chair/bed transfer assist  level: Touching or steadying assistance (Pt > 75%) Chair/bed transfer assistive device: Armrests, Medical sales representative     Max distance: 38' Assist level: Touching or steadying assistance (Pt > 75%)   Wheelchair   Type: Manual Max wheelchair distance: 150' Assist Level: Supervision or verbal cues  Cognition Comprehension Comprehension assist level: Follows complex conversation/direction with no assist  Expression Expression assist level: Expresses complex ideas: With no assist  Social Interaction Social Interaction assist level: Interacts appropriately with others - No medications needed.  Problem Solving Problem solving assist level: Solves complex problems: Recognizes & self-corrects  Memory Memory assist level: Complete Independence: No helper   Medical Problem List and Plan: 1.Functional deficitssecondary totibial plateau fracture s/p ORIF and panniculectomy. NWB LLE with Bledsoe brace. Lock out brace when in bed.    -continue therapies   -pt motivated to get home, wants to be independent 2. DVT Prophylaxis/Anticoagulation: Pharmaceutical:Lovenox 3. Pain Management:Continue Toradol for 5 total days. Oxycodone and robaxin prn. 4. Mood:LCSW to follow for evaluation and support. 5. Neuropsych: This patientiscapable of making decisions onhisown behalf. 6. Skin/Wound Care:Vac removed today. Will add dressing changes bid to abdominal wound. 7. Fluids/Electrolytes/Nutrition:good po, bmet pending todya 8. ABLA: Will recheck labs in am. 9. Leucocytosis: Likely reactive. Monitor for signs of infection/ down to 11.3 today   -keep an eye on wounds.  10. Obesity: Encourage weight loss and life style changes to help promote mobility and overall health.  11. Constipation: discussed healthy bm's/ schedule.    -working toward daily/qod schedule   LOS (Days) [place]  Meredith Staggers, MD 01/29/2018 8:45 AM

## 2018-01-29 NOTE — Progress Notes (Signed)
Occupational Therapy Note  Patient Details  Name: Tyler Walters MRN: [id number] Date of Birth: September 02, 1966  Today's Date: 01/29/2018 OT Individual Time: 0930-1000 OT Individual Time Calculation (min): 30 min   Pt denies pain Individual Therapy  Pt resting in chair bariatric chair upon arrival.  Pt's Bledsoe brace had moved out of place.  Pt c/o increased edema in LLE and brace would not fasten.  PA notified and attended to pt.  PA stated she would contact vendor to adjust brace.  Discussed AE use and discharge plans.  Focus on discharge planning and LTGs. Pt remained in chair with all needs within reach.    Leotis Shames [facility] 01/29/2018, 12:12 PM

## 2018-01-29 NOTE — Progress Notes (Addendum)
Physical Therapy Session Note  Patient Details  Name: Tyler Walters MRN: [id number] Date of Birth: 09-Mar-1966  Today's Date: 01/29/2018 PT Individual Time: 1015-1045 PT Individual Time Calculation (min): 30 min  Short Term Goals: Week 1:  PT Short Term Goal 1 (Week 1): =LTGs due to ELOS  Skilled Therapeutic Interventions/Progress Updates:    Pt received in recliner, agreeable to therapy. Pt complains of 3/10 pain behind L knee and tibia near fracture site. Pt reports that L knee brace is broken and is waiting on a replacement, RN in room and is aware. He reports his LLE has swollen significantly and no longer can fit in brace, leading to straps breaking. Pt performs seated therex for BUE and RLE strengthening: UE ball taps with 5# weighted bar, R LAQ 4# ankle weight, R hip flexion. Pt complains of "burning" near R abdominal incision. Pt educated about modifying exercises to alleviate pain. Therapist educated pt on need to practice real car transfer (pt may have difficulty with simulator car 2/2 body habitus and NWB status). Pt reports he will most likely ride home in mother's sedan. His mother arrived and we set up a time to practice real car transfer on Wednesday. Session limited by lack of LLE brace and incisional pain.  At end of session, pt remained seated in recliner, mother present, and all needs within reach.     Therapy Documentation Precautions:  Precautions Precautions: Fall Required Braces or Orthoses: Other Brace/Splint Other Brace/Splint: L knee bledsoe brace, locked in extension during mobility.  Restrictions Weight Bearing Restrictions: Yes LLE Weight Bearing: Non weight bearing   See Function Navigator for Current Functional Status.   Therapy/Group: Individual Therapy  Caffie Damme 01/29/2018, 11:55 AM

## 2018-01-29 NOTE — Progress Notes (Signed)
Occupational Therapy Session Note  Patient Details  Name: Tyler Walters MRN: [id number] Date of Birth: 1966/08/05  Today's Date: 01/29/2018 OT Individual Time: 1121-1203 OT Individual Time Calculation (min): 42 min    Short Term Goals: Week 1:  OT Short Term Goal 1 (Week 1): STGs = LTGs  Skilled Therapeutic Interventions/Progress Updates:    Pt completed bathing and dressing at the sink during session.  Pt with self reported increased swelling in the left leg this session, which nursing was already aware of.  He agreed to bathing and dressing sit to stand at the sink.  He was able to complete transfer to the wheelchair at the sink from the bariatric recliner with min guard assist.  Noted pt resting foot on the floor with sit to stand transitions throughout session, even though orders state NWBing.  He completed all UB bathing in sitting from the wheelchair with supervision.  Min assist for LB bathing sit to stand.  Therapist had to assist with removal of dirty shorts and help pt thread the new shorts over both LEs.  Feel he could be successful with use of the reacher for next session.  Once threaded, he was able to pull them up over his hips with min assist.  Finished session with transfer back to the bariatric recliner with call button and phone in reach.  Pt's mother also in the room as well.    Therapy Documentation Precautions:  Precautions Precautions: Fall Precaution Comments: lower abdominal wound/sutures Required Braces or Orthoses: Other Brace/Splint Other Brace/Splint: L Bledsoe brace unlocked during day; locked in extension at night or if in bed. No ROM restrictions Restrictions Weight Bearing Restrictions: Yes LLE Weight Bearing: Non weight bearing  Pain: Pain Assessment Faces Pain Scale: Hurts a little bit ADL: See Function Navigator for Current Functional Status.   Therapy/Group: Individual Therapy  Lowell Makara OTR/L 01/29/2018, 3:40 PM

## 2018-01-29 NOTE — Progress Notes (Signed)
Physical Therapy Note  Patient Details  Name: CURTEZ BRALLIER MRN: [id number] Date of Birth: [birth year] Today's Date: 01/29/2018  1530-1620, 50 min individual tx Pain:5/10 L lower leg and abdomen, premedicated  Pt reported that a staff member (unknown) will deliver boot to attach to brace to keep it from sliding down. Pt reported that L thigh has swollen today since ACE wraps were not placed on upper leg.  W/c propulsion using bil UEs and RLE on level tile, x 150', supervision..  Seated in w/c, bil UE stregnthening using football on string equipment x 4 minutes, overhead beach ball toss x 3 minutes, and bounce/pass playground ball x 3 minutes.    Seated kicking large beach ball kicking with R foot x 25.   Stand pivot with RW, L knee brace and min guard assist w/c> bariatric recliner.  Pt left resting sitting upright with bil LEs elevated , pillow under lLE, and all needs at hand.  PT informed Jiles Garter, RN that pt may need ACE wraps on LLE to extend to groin, to address edema.   See function navigator for current status. Neysa Arts 01/29/2018, 12:28 PM

## 2018-01-29 NOTE — Progress Notes (Signed)
Patient information reviewed and entered into eRehab system by Linc Renne, RN, CRRN, PPS Coordinator.  Information including medical coding and functional independence measure will be reviewed and updated through discharge.    

## 2018-01-29 NOTE — Progress Notes (Signed)
Physical Therapy Session Note  Patient Details  Name: Tyler Walters MRN: [id number] Date of Birth: 03-17-1966  Today's Date: 01/29/2018 PT Individual Time: 1345-1445 PT Individual Time Calculation (min): 60 min   Short Term Goals: Week 1:  PT Short Term Goal 1 (Week 1): =LTGs due to ELOS  Skilled Therapeutic Interventions/Progress Updates:    Discussed with PA current status of new straps on brace and clearance for mobility. Due to fall risk and size of pt and ill fitting brace, deferred upright mobility and gait at this time. Carefully performed basic stand pivot transfer from recliner to w/c with min assist and cues for technique with pt adhering to precautions. D/c planning and energy conservation discussed during rest breaks as well. W/c propulsion on unit for mobility training and UE strengthening and cardiovascular endurance 150' on several occasions with pt demonstrating improved endurance per his report from his attempt this weekend. Engaged in gentle AAROM for L knee LAQ with pt able to activate against gravity x 10 reps x 2 sets. Instructed in ankle pumps for strengthening, ROM, and edema control x 20 reps. UE ergometer on random program level 4 x 4 min forward and 4 min backward for UE strengthening and cardiovascular endurance. Recommended that another option would be to use KI in the bed or in w/c to maintain extension as brace not fitting correctly. Pt states he feels ok at the moment but passed this information onto nursing as well since straps for brace still have not arrived.   Therapy Documentation Precautions:  Precautions Precautions: Fall Precaution Comments: lower abdominal wound/sutures Required Braces or Orthoses: Other Brace/Splint Other Brace/Splint: L Bledsoe brace unlocked during day; locked in extension at night or if in bed. No ROM restrictions Restrictions Weight Bearing Restrictions: Yes LLE Weight Bearing: Non weight bearing  Pain: Reports some discomfort  and tightness in LLE rated at 4/10. Repositioned and rested throughout session.  See Function Navigator for Current Functional Status.   Therapy/Group: Individual Therapy  Canary Brim Ivory Broad, PT, DPT  01/29/2018, 2:57 PM

## 2018-01-29 NOTE — Progress Notes (Signed)
[facility] Progress Note     Subjective: CC: increased drainage from incision and skin maceration  Patient reports he feels like abdomen is tight and he has felt increase swelling. Reports increased drainage from incision, thin clear pink fluid. Tolerating diet, had some constipation and a difficult BM 2 days ago but that is improving.   Objective: Vital signs in last 24 hours: Temp:  [98.2 F (36.8 C)-98.5 F (36.9 C)] 98.2 F (36.8 C) (03/25 0515) Pulse Rate:  [93-100] 93 (03/25 0515) Resp:  [17-18] 18 (03/25 0515) BP: (140-159)/(89-92) 140/92 (03/25 0515) SpO2:  [97 %-100 %] 100 % (03/25 0515) Last BM Date: 01/29/18  Intake/Output from previous day: 03/24 0701 - 03/25 0700 In: 840 [P.O.:840] Out: 850 [Urine:850] Intake/Output this shift: Total I/O In: 240 [P.O.:240] Out: -   PE: Gen:  Alert, NAD, pleasant Pulm:  Normal effort Abd: Soft, non-tender, non-distended,, no HSM, incisions C/D/I Skin: R shoulder incision c/d/i; panniculectomy incision intact with some maceration of skin along the inferior aspect and small amount serosanguinous looking drainage      Psych: A&Ox3   Lab Results:  Recent Labs    01/27/18 0616  WBC 11.6*  HGB 9.8*  HCT 32.1*  PLT 236   BMET Recent Labs    01/27/18 0616  NA 135  K 4.2  CL 99*  CO2 25  GLUCOSE 101*  BUN 15  CREATININE 0.98  CALCIUM 8.0*   PT/INR No results for input(s): LABPROT, INR in the last 72 hours. CMP     Component Value Date/Time   NA 135 01/27/2018 0616   K 4.2 01/27/2018 0616   CL 99 (L) 01/27/2018 0616   CO2 25 01/27/2018 0616   GLUCOSE 101 (H) 01/27/2018 0616   BUN 15 01/27/2018 0616   CREATININE 0.98 01/27/2018 0616   CALCIUM 8.0 (L) 01/27/2018 0616   PROT 6.3 (L) 01/27/2018 0616   ALBUMIN 2.4 (L) 01/27/2018 0616   AST 35 01/27/2018 0616   ALT 14 (L) 01/27/2018 0616   ALKPHOS 63 01/27/2018 0616   BILITOT 1.2 01/27/2018 0616   GFRNONAA >60 01/27/2018 0616   GFRAA >60  01/27/2018 0616   Lipase     Component Value Date/Time   LIPASE 19 12/23/2016 0818       Studies/Results: No results found.  Anti-infectives: Anti-infectives (From admission, onward)   None       Assessment/Plan Tibial plateau fracture -s/p ORIF, per ortho  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 Georgette Dover - incision healing well  R abdominal wall soft tissue mass - s/p panniculectomy 01/19/18 Dr. Georgette Dover - POD#10 - recheck CBC, WBC was 11 on 3/23, afebrile - keep area under pannus dry, can use silver fabric strips to help wick moisture, WOC for macerated skin  - will discuss with MD if sutures/staples should remain in place, or if we should consider removal of some sutures and then either packing wound or VAC  FEN: reg diet VTE: SCD, lovenox 95 mg Q24h ID: Ancef 3/12, 3/13, 3/15-16;3/20  Dispo - will discuss with MD, sutures/staples may need to be removed.  Check cbc.      LOS: 3 days    Brigid Re , [facility] 01/29/2018, 12:29 PM Pager: [phone] Consults: [phone] Mon-Fri 7:00 am-4:30 pm Sat-Sun 7:00 am-11:30 am

## 2018-01-29 NOTE — Progress Notes (Signed)
Inpatient [facility] Individual Statement of Services  Patient Name:  Tyler Walters  Date:  01/29/2018  Welcome to the [facility].  Our goal is to provide you with an individualized program based on your diagnosis and situation, designed to meet your specific needs.  With this comprehensive rehabilitation program, you will be expected to participate in at least 3 hours of rehabilitation therapies Monday-Friday, with modified therapy programming on the weekends.  Your rehabilitation program will include the following services:  Physical Therapy (PT), Occupational Therapy (OT), 24 hour per day rehabilitation nursing, Neuropsychology, Case Management (Social Worker), Rehabilitation Medicine, Nutrition Services and Pharmacy Services  Weekly team conferences will be held on Tuesdays to discuss your progress.  Your Social Worker will talk with you frequently to get your input and to update you on team discussions.  Team conferences with you and your family in attendance may also be held.  Expected length of stay:  5 to 7 days  Overall anticipated outcome:  Modified independent with supervision for stairs, car transfers, and bathing; minimal assistance with lower body dressing  Depending on your progress and recovery, your program may change. Your Social Worker will coordinate services and will keep you informed of any changes. Your Social Worker's name and contact numbers are listed  below.  The following services may also be recommended but are not provided by the [facility] will be made to provide these services after discharge if needed.  Arrangements include referral to agencies that provide these services.  Your insurance has been verified to be:  Self pay Your primary doctor is:  You do not have one  currently, but Sonia Baller will try to get you into one of the community clinics.  Pertinent information will be shared with your doctor and your insurance company.  Social Worker:  Alfonse Alpers, LCSW  [phone] or [phone]  Information discussed with and copy given to patient by: Trey Sailors, 01/29/2018, 2:17 PM

## 2018-01-29 NOTE — Progress Notes (Signed)
[facility] Progress Note Patient Details:  Tyler Walters 1966/08/25 [id number]  Patient ID: [name], male   DOB: 30-Dec-1965, 52 y.o.   MRN: [id number]   [name] Called in bio-tech brace order; spoke with Bella Kennedy

## 2018-01-29 NOTE — Progress Notes (Signed)
Social Work Assessment and Plan  Patient Details  Name: Tyler Walters MRN: [id number] Date of Birth: 1965-11-26  Today's Date: 01/29/2018  Problem List:  Patient Active Problem List   Diagnosis Date Noted  . Closed fracture of left tibial plateau 01/26/2018  . Pannus, abdominal 01/19/2018  . Lipoma of abdominal wall   . Closed bicondylar fracture of left tibial plateau 01/16/2018  . Morbid obesity ([place]) 06/25/2009  . CELLULITIS AND ABSCESS OF TRUNK 06/25/2009   Past Medical History:  Past Medical History:  Diagnosis Date  . Lipoma of abdominal wall    Large lipoma   Past Surgical History:  Past Surgical History:  Procedure Laterality Date  . EXTERNAL FIXATION LEG Left 01/16/2018   Procedure: LEFT KNEE SPANNING EXTERNAL FIXATION LEG;  Surgeon: Leandrew Koyanagi, MD;  Location: [facility];  Service: Orthopedics;  Laterality: Left;  . EXTERNAL FIXATION REMOVAL Left 01/24/2018   Procedure: REMOVAL EXTERNAL FIXATION LEG;  Surgeon: Shona Needles, MD;  Location: [place] [facility];  Service: Orthopedics;  Laterality: Left;  both upper and lower leg involved with fixator  . LIPOMA EXCISION Right 01/19/2018   Procedure: EXCISIONOF RIGHT CHEST WALL MASS, EXCISION OF ABDOMINAL MASS;  Surgeon: Donnie Mesa, MD;  Location: [facility];  Service: General;  Laterality: Right;  . ORIF TIBIA PLATEAU Left 01/24/2018   Procedure: OPEN REDUCTION INTERNAL FIXATION (ORIF) TIBIAL PLATEAU;  Surgeon: Shona Needles, MD;  Location: [place];  Service: Orthopedics;  Laterality: Left;  . PANNICULECTOMY N/A 01/19/2018   Procedure: PANNICULECTOMY;  Surgeon: Donnie Mesa, MD;  Location: [facility];  Service: General;  Laterality: N/A;   Social History:  reports that he has never smoked. He has never used smokeless tobacco. He reports that he does not drink alcohol or use drugs.  Family / Support Systems Marital Status: Married How Long?: 21 years, but have been together since they were 52 y/o Patient Roles: Spouse, Parent, Other  (Comment)(grandparent; son; Chief Strategy Officer) Spouse/Significant Other: [name] - wife - [phone]; [phone] Children: 2 adult sons Other Supports: mother Anticipated Caregiver: wife and son Ability/Limitations of Caregiver: wife disabled but self sufficient and can provide supervision Caregiver Availability: 24/7 Family Dynamics: close, supportive family  Social History Preferred language: English Religion: Methodist Read: Yes Write: Yes Employment Status: Employed(as he could physically tolerate) Name of Employer: self Animal nutritionist; 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  Pt was told by surgeon that he will be recovering for a year. Legal History/Current Legal Issues: Pt reported that he is going through bankruptcy and has hired an Forensic psychologist to assist with his disability application/appeal. Guardian/Conservator: N/A - MD has determined that pt is capable of making his own decisions.   Abuse/Neglect Abuse/Neglect Assessment Can Be Completed: Yes Physical Abuse: Denies Verbal Abuse: Denies Sexual Abuse: Denies Exploitation of patient/patient's resources: Denies Self-Neglect: Denies  Emotional Status Pt's affect, behavior and adjustment status: Pt was slightly emotional as he talks about his physical condition and how hard it has been for him to continue to work over the last several years - describing himself as "tired".  He feels this injury would not have happened if he hadn't been on the ladder when his ankle gave out, a condition he's been struggling with for years and has tried to work through  it due to not being approved for disability.  Recent Psychosocial Issues: Pt has been applying for disability for 5 years and has been denied, so is still trying to work  through his pain/conditions.  Pt also has a 12y/o granddtr for whom he is caring, in the home. Psychiatric History: none reported Substance Abuse History: none reported  Patient / Family Perceptions, Expectations & Goals Pt/Family understanding of illness & functional limitations: Pt/family have a good understanding of pt's condition/limitations. Premorbid pt/family roles/activities: Pt worked a lot and enjoyed his family.  He is also a fisherman and competes in bass tournaments. 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 Pt/family expectations/goals: Pt would like to get back to fishing tournaments and wants to have his independence back.  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  Pt worries about who will do all of the jobs he's been planning to do around the house. Patient/Family Preliminary Plans: Pt plans to return to his home with his wife, son, mother to assist as needed. Social Work Anticipated Follow Up Needs: HH/OP Expected length of  stay: 5 to 7 days  Clinical Impression CSW met with pt, his wife, and his mother to introduce self and role of CSW, as well as to complete assessment.  Pt is clearly worried about finances and how he will work now with new injury to left leg when he was already struggling to work.  Has a disability attorney trying to help him.  Pt also mentioned filing for bankruptcy.  CSW offered supportive listening.  [name] and mother are supportive.  Pt/wife care for their 57 y/o granddtr and have since she was 58 y/o.  They go to court in June for formal custody hearing.  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  Pt has tow adult sons, one of whom works with him.  Pt has been seen by financial counselor and CSW is following up with her.  Pt will have DME and HH and MATCH needs at d/c.  CSW will also work to have pt assigned to one of the community clinics.  CSW is available to assist as needed.  Sylva Overley, Silvestre Mesi 01/29/2018, 2:51 PM

## 2018-01-29 NOTE — Consult Note (Signed)
[facility] Nurse wound consult note Reason for Consult: surgical incision line care, pannus Wound type: intertriginous dermatitis Wound bed: Scattered areas of irritation along the sugical incision line, pannus Drainage (amount, consistency, odor) none present on assessment; indications of drainage in notes Periwound: normal color and texture Dressing procedure/placement/frequency: interdry.  Please follow the interdry Ag+ orders. Thank you for the consult.  Discussed plan of care with the patient and bedside nurse.  [facility] nurse will not follow at this time.  Please re-consult the [facility] team if needed.  Val Riles, RN, MSN, CWOCN, CNS-BC, pager [phone]

## 2018-01-29 NOTE — Progress Notes (Addendum)
No  BM during night. Reports taking dulcolax tabs on other unit. Abd. Dressing changed at 0600, mod. Amount of serous sanguinous drainage. Passed on to day shift RN to get daily weight since patient in bari chair all night.  Tyler Walters A

## 2018-01-29 NOTE — IPOC Note (Signed)
Overall Plan of Care [facility]) Patient Details Name: JEROD MCQUAIN MRN: [id number] DOB: 1965-12-06  Admitting Diagnosis: Closed bicondylar fracture of left tibial plateau  Hospital Problems: Principal Problem:   Closed bicondylar fracture of left tibial plateau Active Problems:   Morbid obesity ([place])   Lipoma of abdominal wall   Pannus, abdominal     Functional Problem List: Nursing Behavior, Bladder, Bowel, Edema, Safety, Skin Integrity  PT Balance, Endurance, Edema, Pain, Safety  OT Balance, Endurance, Motor, Pain  SLP    TR         Basic ADL's: OT Bathing, Dressing, Toileting     Advanced  ADL's: OT       Transfers: PT Bed Mobility, Bed to Chair, Car, Floor, Manufacturing systems engineer, Tub/Shower     Locomotion: PT Ambulation, Emergency planning/management officer, Stairs     Additional Impairments: OT None  SLP        TR      Anticipated Outcomes Item Anticipated Outcome  Self Feeding no goal  Swallowing      Basic self-care  set up with bathing and UB dressing, min A LB dressing  Toileting  mod I    Bathroom Transfers mod I to toilet   Bowel/Bladder  Cont B/B LBM 01/26/18. Will remain cont, assist pt with bathroom needs.   Transfers  Mod I   Locomotion  Mod I w/c and gait short distance  Communication     Cognition     Pain  Will administer pain regimen as need. tolerable pain 4  Safety/Judgment  Remain free injuries and falls    Therapy Plan: PT Intensity: Minimum of 1-2 x/day ,45 to 90 minutes PT Frequency: 5 out of 7 days PT Duration Estimated Length of Stay: 5-7 days OT Intensity: Minimum of 1-2 x/day, 45 to 90 minutes OT Frequency: 5 out of 7 days OT Duration/Estimated Length of Stay: 5-7 days      Team Interventions: Nursing Interventions Bladder Management, Bowel Management, Pain Management, Skin Care/Wound Management  PT interventions Visual/perceptual remediation/compensation, Stair training, Pain management, Disease management/prevention,  Ambulation/gait training, Training and development officer, DME/adaptive equipment instruction, Therapeutic Activities, Patient/family education, Wheelchair propulsion/positioning, Therapeutic Exercise, Psychosocial support, Cognitive remediation/compensation, Community reintegration, Functional mobility training, Skin care/wound management, UE/LE Strength taining/ROM, UE/LE Coordination activities, Splinting/orthotics, Neuromuscular re-education, Discharge planning  OT Interventions Balance/vestibular training, Discharge planning, DME/adaptive equipment instruction, Functional mobility training, Self Care/advanced ADL retraining, Patient/family education, Therapeutic Exercise, UE/LE Strength taining/ROM, Therapeutic Activities, Pain management  SLP Interventions    TR Interventions    SW/CM Interventions Discharge Planning, Psychosocial Support, Patient/Family Education   Barriers to Discharge MD  Home enviroment access/loayout  Nursing      PT Inaccessible home environment 7 steps to enter house, family/friends planning to build ramp   OT      SLP      SW       Team Discharge Planning: Destination: PT-Home ,OT- Home , SLP-Home Projected Follow-up: PT-Home health PT, OT-  None, SLP-None Projected Equipment Needs: PT-To be determined, OT- None recommended by OT, SLP-None recommended by SLP Equipment Details: PT- , OT-  Patient/family involved in discharge planning: PT- Patient,  OT-Patient, SLP-Patient  MD ELOS: 7 days Medical Rehab Prognosis:  Excellent Assessment: The patient has been admitted for CIR therapies with the diagnosis of debility, left tibial plateau fx. The team will be addressing functional mobility, strength, stamina, balance, safety, adaptive techniques and equipment, self-care, bowel and bladder mgt, patient and caregiver education, pain control, ortho precautions, activity  toleracne. Goals have been set at mod I for mobility and self-care tasks.    Meredith Staggers, MD,  FAAPMR      See Team Conference Notes for weekly updates to the plan of care

## 2018-01-29 NOTE — Progress Notes (Signed)
Recreational Therapy Session Note  Patient Details  Name: Tyler Walters MRN: [id number] Date of Birth: October 21, 1966 Today's Date: 01/29/2018   Pt with anticipated short LOS, TR eval deferred at this time.  Will continue to monitor through team. Loray Akard 01/29/2018, 4:05 PM

## 2018-01-29 NOTE — Plan of Care (Signed)
  Problem: RH BOWEL ELIMINATION Goal: RH STG MANAGE BOWEL WITH ASSISTANCE Description STG Manage Bowel with minimal Assistance.  Outcome: Not Progressing   Problem: RH PAIN MANAGEMENT Goal: RH STG PAIN MANAGED AT OR BELOW PT'S PAIN GOAL Description Pain level < 4 on pain scale  Outcome: Not Progressing

## 2018-01-30 ENCOUNTER — Inpatient Hospital Stay (HOSPITAL_COMMUNITY): Payer: Self-pay | Admitting: Physical Therapy

## 2018-01-30 ENCOUNTER — Inpatient Hospital Stay (HOSPITAL_COMMUNITY): Payer: Self-pay

## 2018-01-30 DIAGNOSIS — D72823 Leukemoid reaction: Secondary | ICD-10-CM

## 2018-01-30 DIAGNOSIS — S82142A Displaced bicondylar fracture of left tibia, initial encounter for closed fracture: Secondary | ICD-10-CM

## 2018-01-30 LAB — BASIC METABOLIC PANEL
Anion gap: 7 (ref 5–15)
BUN: 17 mg/dL (ref 6–20)
CHLORIDE: 103 mmol/L (ref 101–111)
CO2: 28 mmol/L (ref 22–32)
CREATININE: 1.03 mg/dL (ref 0.61–1.24)
Calcium: 8.6 mg/dL — ABNORMAL LOW (ref 8.9–10.3)
GFR calc Af Amer: >60 mL/min (ref >60)
GFR calc non Af Amer: >60 mL/min (ref >60)
Glucose, Bld: 117 mg/dL — ABNORMAL HIGH (ref 65–99)
Potassium: 3.7 mmol/L (ref 3.5–5.1)
Sodium: 138 mmol/L (ref 135–145)

## 2018-01-30 NOTE — Progress Notes (Signed)
Occupational Therapy Session Note  Patient Details  Name: Tyler Walters MRN: [id number] Date of Birth: [birth year]  Today's Date: 01/30/2018 OT Individual Time: 7782-4235 OT Individual Time Calculation (min): 59 min    Short Term Goals: Week 1:  OT Short Term Goal 1 (Week 1): STGs = LTGs  Skilled Therapeutic Interventions/Progress Updates:    Pt received in room sitting up in chair with family present. Pt reports 5/10 pain in his L leg. Nursing was summoned to assist with maneuvering bari-chair while maintaining L LE NWB status. Pt performed sit to stand transfer with RW with supervision, pivoting to w/c. Pt propelled himself to rehab gym 122ft with mod I. Extensive discussion with pt re d/c planning, OT goals, and home set up. Pt mentioned distal R LE dressing as a continued challenge. Pt provided with and educated re elastic shoe strings, with pt indicating understanding and able to slide into R shoe with set up. Pt then transferred to therapy mat with CGA. Pt completed 3 sets of 25ea B UE shoulder flexion, extension, and scapular retraction exercises to increase B UE strength and conditioning needed to propel w/c and to support self during RW use. Pt states that his house is fully adapted and can fit RW in all bathrooms/rooms. Pt returned to room, propelling self another 150 ft, and nursing was summoned d/t wound vac alarm sounding. Pt transferred back to bari-chair with (S) and was provided with a slideboard under his R LE to support his R Leg in full extension, with pt stating "that is the best it has ever felt". Pt left in room with nursing staff present.   Therapy Documentation Precautions:  Precautions Precautions: Fall Precaution Comments: lower abdominal wound/sutures Required Braces or Orthoses: Other Brace/Splint Other Brace/Splint: L Bledsoe brace unlocked during day; locked in extension at night or if in bed. No ROM restrictions Restrictions Weight Bearing Restrictions: Yes LLE  Weight Bearing: Non weight bearing General: General PT Missed Treatment Reason: Unavailable (Comment) Vital Signs: Therapy Vitals Temp: 99.5 F (37.5 C) Temp Source: Oral Pulse Rate: (!) 101 Resp: 16 BP: (!) 158/92 Patient Position (if appropriate): Sitting Oxygen Therapy SpO2: 98 % O2 Device: Room Air Pain: Pain Assessment Pain Scale: 0-10 Pain Score: 5  Pain Type: Surgical pain Pain Location: Leg Pain Orientation: Left Pain Descriptors / Indicators: Aching Pain Frequency: Constant Pain Onset: On-going Patients Stated Pain Goal: 3(resting on chair) Pain Intervention(s): Medication (See eMAR) Multiple Pain Sites: No 2nd Pain Site Pain Intervention(s): Repositioned  See Function Navigator for Current Functional Status.   Therapy/Group: Individual Therapy  Curtis Sites 01/30/2018, 3:25 PM

## 2018-01-30 NOTE — Progress Notes (Addendum)
  [facility] Progress Note     Subjective: CC- tired Patient moving around room with walker. States that his abdomen still feels tight and swollen. He reports persistent serosanguinous drainage from 2 areas of the wound. Denies intraabdominal pain, tolerating diet.   Objective: Vital signs in last 24 hours: Temp:  [98.4 F (36.9 C)-99.4 F (37.4 C)] 98.6 F (37 C) (03/26 0800) Pulse Rate:  [95-107] 98 (03/26 0800) Resp:  [18-20] 20 (03/26 0800) BP: (135-156)/(85-94) 148/85 (03/26 0800) SpO2:  [97 %-99 %] 99 % (03/26 0800) Last BM Date: 01/29/18  Intake/Output from previous day: 03/25 0701 - 03/26 0700 In: 600 [P.O.:600] Out: -  Intake/Output this shift: Total I/O In: 240 [P.O.:240] Out: -   PE: Gen:  Alert, NAD, pleasant HEENT: EOM's intact, pupils equal and round Pulm:  effort normal Abd: obese, soft, NT, ND, incision mostly cdi with staples and sutures in place/ 1-2cm area in central aspect and to the R with trace serosanguinous drainage/ no surrounding erythema or definite fluctuance/ no purulent drainage  Lab Results:  Recent Labs    01/29/18 1343  WBC 11.3*  HGB 9.3*  HCT 30.8*  PLT 604*   BMET Recent Labs    01/30/18 0741  NA 138  K 3.7  CL 103  CO2 28  GLUCOSE 117*  BUN 17  CREATININE 1.03  CALCIUM 8.6*   PT/INR No results for input(s): LABPROT, INR in the last 72 hours. CMP     Component Value Date/Time   NA 138 01/30/2018 0741   K 3.7 01/30/2018 0741   CL 103 01/30/2018 0741   CO2 28 01/30/2018 0741   GLUCOSE 117 (H) 01/30/2018 0741   BUN 17 01/30/2018 0741   CREATININE 1.03 01/30/2018 0741   CALCIUM 8.6 (L) 01/30/2018 0741   PROT 6.3 (L) 01/27/2018 0616   ALBUMIN 2.4 (L) 01/27/2018 0616   AST 35 01/27/2018 0616   ALT 14 (L) 01/27/2018 0616   ALKPHOS 63 01/27/2018 0616   BILITOT 1.2 01/27/2018 0616   GFRNONAA >60 01/30/2018 0741   GFRAA >60 01/30/2018 0741   Lipase     Component Value Date/Time   LIPASE 19  12/23/2016 0818       Studies/Results: No results found.  Anti-infectives: Anti-infectives (From admission, onward)   None       Assessment/Plan Tibial plateau fracture -s/p ORIF, per ortho  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 Georgette Dover - incision healing well  R abdominal wall soft tissue mass - s/p panniculectomy 01/19/18 Dr. Georgette Dover - POD#11 - WBC still slightly elevated at 11.3 but not increasing, TMAX 99.4 - sutures/staples ready for removal on Friday 3/29  FEN: reg diet VTE: SCD, lovenox 95 mg Q24h ID: None currently. Ancef 3/12, 3/13, 3/15-16;3/20 Follow up: Dr. Georgette Dover  Dispo - Discussed with MD and [facility] RN Melody. Will apply Provena-type NPWT to incision today and recheck wound in 2 days. Keep area under pannus dry and clean.    LOS: 4 days    Wellington [place] , [facility] 01/30/2018, 9:49 AM Pager: [phone] Consults: [phone] Mon-Fri 7:00 am-4:30 pm Sat-Sun 7:00 am-11:30 am

## 2018-01-30 NOTE — Progress Notes (Signed)
Physical Therapy Session Note  Patient Details  Name: Tyler Walters MRN: [id number] Date of Birth: 03-May-1966  Today's Date: 01/30/2018 PT Individual Time: 1030-1100 PT Individual Time Calculation (min): 30 min   Short Term Goals: Week 1:  PT Short Term Goal 1 (Week 1): =LTGs due to ELOS  Skilled Therapeutic Interventions/Progress Updates:    Pt seated in w/c in room upon therapist arrival, pt is agreeable to participate in therapy session. Pt reports no pain this AM. Stand pivot transfer w/c to bed with CGA and RW. Sit to supine SBA for safety. Assisted pt with tightening L knee brace while in supine. Supine to sit SBA with HOB elevated. Stand pivot transfer bed to w/c CGA with RW. Manual w/c propulsion 2 x 150 ft with BUE for endurance. UBE x 4 min forwards, x 4 min backwards for UE strengthening and endurance. Stand pivot transfer w/c to recliner chair in room with RW and CGA. Pt left seated in recliner chair in room with needs in reach, family present.  Therapy Documentation Precautions:  Precautions Precautions: Fall Precaution Comments: lower abdominal wound/sutures Required Braces or Orthoses: Other Brace/Splint Other Brace/Splint: L Bledsoe brace unlocked during day; locked in extension at night or if in bed. No ROM restrictions Restrictions Weight Bearing Restrictions: Yes LLE Weight Bearing: Non weight bearing  See Function Navigator for Current Functional Status.   Therapy/Group: Individual Therapy  Excell Seltzer, PT, DPT  01/30/2018, 1:44 PM

## 2018-01-30 NOTE — Consult Note (Signed)
[place] Nurse wound consult note Reason for Consult: surgical wound, excessive drainage Wound type: surgical  Pressure Injury POA: NA Measurement: 0.1cm x 76cm x 0.5cm  Wound bed: closed incision with staples S/P paniculectomy 01/19/18 Drainage (amount, consistency, odor) serosanguinous  Periwound: edematous, noted drainage from most of staple line, some tissue maceration and skin necrosis noted along the left lateral side of the incision Dressing procedure/placement/frequency: Mepitel used to protect the incision 1pc of black foam (lg dressing) cut cinnamon roll style and stretched to long piece to cover entire incision.  2 TRAC pads placed right and left side of incision. Y connected to one machine at 145mmHG. Patient tolerated well. Dressing took [place] nurse almost 1 hour to perform.  Will leave dressing in place for remainder of the week and evaluate drainage amounts in a few days.   Notified bedside nurses and rehab staff to make sure and be careful with tubing not to pull on TRAC pads.   [place], [place], [place]

## 2018-01-30 NOTE — Progress Notes (Signed)
Occupational Therapy Session Note  Patient Details  Name: DIMETRI ARMITAGE MRN: [id number] Date of Birth: Aug 06, 1966  Today's Date: 01/30/2018 OT Individual Time: 0900-1000 OT Individual Time Calculation (min): 60 min    Short Term Goals: Week 1:  OT Short Term Goal 1 (Week 1): STGs = LTGs  Skilled Therapeutic Interventions/Progress Updates:    Pt resting in bariatric chair upon arrival and requested to use toilet.  Pt amb with RW (NWB on LLE) to bathroom with steady A and doffed pants without assistance.  Pt attempted to perform hygiene with toilet aide but required assistance for thoroughness. Pt with abdominal drainage while seated on toilet and required RN assistance.  Surgical PA attended to pt and pt amb with RW back to bed and transferred to supine.  Pt returned to seated EOB for meds and transferred to w/c at sink.  Focus on functional transfers and ambulation with RW to increase independence with BADLs.   Therapy Documentation Precautions:  Precautions Precautions: Fall Precaution Comments: lower abdominal wound/sutures Required Braces or Orthoses: Other Brace/Splint Other Brace/Splint: L Bledsoe brace unlocked during day; locked in extension at night or if in bed. No ROM restrictions Restrictions Weight Bearing Restrictions: Yes LLE Weight Bearing: Non weight bearing Pain: Pt denies pain See Function Navigator for Current Functional Status.   Therapy/Group: Individual Therapy  Leroy Libman 01/30/2018, 10:04 AM

## 2018-01-30 NOTE — Progress Notes (Signed)
Physical Therapy Session Note  Patient Details  Name: Tyler Walters MRN: [id number] Date of Birth: 05/22/1966  Today's Date: 01/30/2018 PT Individual Time: 1115-1150 PT Individual Time Calculation (min): 35 min  and Today's Date: 01/30/2018 PT Missed Time: 10 Minutes Missed Time Reason: Unavailable (Comment)  Short Term Goals: Week 1:  PT Short Term Goal 1 (Week 1): =LTGs due to ELOS  Skilled Therapeutic Interventions/Progress Updates:   Pt in recliner and agreeable to therapy, pain as detailed below. Pt asked therapist to fix LLE ace wrap as it had fallen below knee. Re-wrapped LLE to above knee w/ guaze wrap underneath to provide friction in hopes that ace wrap will stay put under brace during mobility. Performed LLE strengthening exercises in seated including quad sets 3x10, ankle pumps 3x10, and assisted SLR 2x10 within pain tolerance. Pt w/ decreased eccentric anterior tib control during ankle pumps, provided w/ green TB for ankle pumps to assist w/ anterior tib strengthening. Missed 10 min of skilled PT as pt requesting to eat lunch before MD arriving to put on wound vac. Ended session in recliner eating lunch, in care of wife and all needs met.   Therapy Documentation Precautions:  Precautions Precautions: Fall Precaution Comments: lower abdominal wound/sutures Required Braces or Orthoses: Other Brace/Splint Other Brace/Splint: L Bledsoe brace unlocked during day; locked in extension at night or if in bed. No ROM restrictions Restrictions Weight Bearing Restrictions: Yes LLE Weight Bearing: Non weight bearing General: PT Amount of Missed Time (min): 10 Minutes PT Missed Treatment Reason: Unavailable (Comment) Pain: Pain Assessment Pain Scale: 0-10 Pain Score: 3  Pain Type: Surgical pain Pain Location: Abdomen Pain Descriptors / Indicators: Discomfort;Numbness Pain Onset: With Activity Patients Stated Pain Goal: 2 Pain Intervention(s): Medication (See eMAR)  See Function  Navigator for Current Functional Status.   Therapy/Group: Individual Therapy  Mehdi Gironda K Arnette 01/30/2018, 12:28 PM

## 2018-01-30 NOTE — Progress Notes (Signed)
[facility]IC[facility] & RE[facility]ABILITATION     PROGRESS NOTE    Subjective/Complaints: Had a reasonable night.  Is tired from therapies.  Left leg still with swelling.    ROS: Patient denies fever, rash, sore throat, blurred vision, nausea, vomiting, diarrhea, cough, shortness of breath or chest pain, joint or back pain, headache, or mood change.     Objective: Vital Signs: Blood pressure 135/87, pulse 95, temperature 98.4 F (36.9 C), temperature source Oral, resp. rate 18, height 5\' 8"  (1.727 m), weight (!) 192.8 kg (425 lb), SpO2 98 %. No results found. Recent Labs    01/29/18 1343  WBC 11.3*  HGB 9.3*  HCT 30.8*  PLT 604*   No results for input(s): NA, K, CL, GLUCOSE, BUN, CREATININE, CALCIUM in the last 72 hours.  Invalid input(s): CO CBG (last 3)  No results for input(s): GLUCAP in the last 72 hours.  Wt Readings from Last 3 Encounters:  01/27/18 (!) 192.8 kg (425 lb)  01/24/18 (!) 147.9 kg (326 lb)    Physical Exam:  Constitutional: No distress . Vital signs reviewed. HEENT: EOMI, oral membranes moist Cardiovascular: RRR without murmur. No JVD    Respiratory: CTA Bilaterally without wheezes or rales. Normal effort    GI: BS +, non-tender, non-distended, wound with staples/drainage Musculoskeletal: He exhibits2+ LLE edema. He exhibits notenderness in calf/thigh. LLE limited by surgery and brace.   Neurological: He isalertand oriented to person, place, and time. Nocranial nerve deficit. Speech clear. Able to follow commands without difficulty. Moves BUE/RLE 4-5/5.  LLE grossly intact but limited by ortho Skin: Skin iswarmand dry.  Psychiatric: He has anormal mood and affect. Hisbehavior is normal.Judgmentand thought contentnormal.     Assessment/Plan: 1. Functional deficits secondary to left tibial plateau fx, morbid obesity which require 3+ hours per day of interdisciplinary therapy in a comprehensive inpatient rehab setting. Physiatrist  is providing close team supervision and 24 hour management of active medical problems listed below. Physiatrist and rehab team continue to assess barriers to discharge/monitor patient progress toward functional and medical goals.  Function:  Bathing Bathing position   Position: Wheelchair/chair at sink  Bathing parts Body parts bathed by patient: Right arm, Left arm, Chest, Abdomen, Front perineal area, Right upper leg, Left upper leg Body parts bathed by helper: Buttocks  Bathing assist        Upper Body Dressing/Undressing Upper body dressing   What is the patient wearing?: Pull over shirt/dress     Pull over shirt/dress - Perfomed by patient: Thread/unthread right sleeve, Thread/unthread left sleeve, Put head through opening, Pull shirt over trunk          Upper body assist Assist Level: Set up      Lower Body Dressing/Undressing Lower body dressing   What is the patient wearing?: Pants, Non-skid slipper socks     Pants- Performed by patient: Pull pants up/down Pants- Performed by helper: Thread/unthread right pants leg, Thread/unthread left pants leg   Non-skid slipper socks- Performed by helper: Don/doff right sock, Don/doff left sock                  Lower body assist        Toileting Toileting   Toileting steps completed by patient: Adjust clothing prior to toileting, Adjust clothing after toileting(wearing hospital gown) Toileting steps completed by helper: Performs perineal hygiene Toileting Assistive Devices: Grab bar or rail, Toilet aid  Toileting assist Assist level: Touching or steadying assistance (Pt.75%)   Transfers Chair/bed  transfer   Chair/bed transfer method: Stand pivot Chair/bed transfer assist level: Touching or steadying assistance (Pt > 75%) Chair/bed transfer assistive device: Armrests, Orthosis, Walker     Locomotion Ambulation     Max distance: 10'  Assist level: Touching or steadying assistance (Pt > 75%)   Wheelchair    Type: Manual Max wheelchair distance: 150' Assist Level: Supervision or verbal cues  Cognition Comprehension Comprehension assist level: Follows complex conversation/direction with no assist  Expression Expression assist level: Expresses complex ideas: With no assist  Social Interaction Social Interaction assist level: Interacts appropriately with others - No medications needed.  Problem Solving Problem solving assist level: Solves complex problems: Recognizes & self-corrects  Memory Memory assist level: Complete Independence: No helper   Medical Problem List and Plan: 1.Functional deficitssecondary totibial plateau fracture s/p ORIF and panniculectomy. NWB LLE with Bledsoe brace. Lock out brace when in bed.    -continue therapies   -Team conference today 2. DVT Prophylaxis/Anticoagulation: Pharmaceutical:Lovenox   -dopplers pending 3. Pain Management:Continue Toradol for 5 total days. Oxycodone and robaxin prn. 4. Mood:LCSW to follow for evaluation and support. 5. Neuropsych: This patientiscapable of making decisions onhisown behalf. 6. Skin/Wound Care:Vac removed today. Will add dressing changes bid to abdominal wound. 7. Fluids/Electrolytes/Nutrition:good po, bmet pending todya 8. ABLA: Hemoglobin 9.3. 9. Leucocytosis: Likely reactive. Monitor for signs of infection/ down to 11.3 today   -Following serially.  Wounds are intact.  10. Obesity: Encourage weight loss and life style changes to help promote mobility and overall health.  11. Constipation:  .    -working toward daily/qod schedule.  Had BM yesterday   LOS (Days) [place] EVALUATION WAS PERFORMED  Meredith Staggers, MD 01/30/2018 8:19 AM

## 2018-01-31 ENCOUNTER — Ambulatory Visit (HOSPITAL_COMMUNITY): Payer: Self-pay

## 2018-01-31 ENCOUNTER — Inpatient Hospital Stay (HOSPITAL_COMMUNITY): Payer: Self-pay | Admitting: Physical Therapy

## 2018-01-31 ENCOUNTER — Encounter (HOSPITAL_COMMUNITY): Payer: Self-pay | Admitting: Psychology

## 2018-01-31 ENCOUNTER — Inpatient Hospital Stay (HOSPITAL_COMMUNITY): Payer: Self-pay

## 2018-01-31 ENCOUNTER — Inpatient Hospital Stay (HOSPITAL_COMMUNITY): Payer: Self-pay | Admitting: Occupational Therapy

## 2018-01-31 MED ORDER — OXYCODONE HCL 5 MG PO TABS
5.0000 mg | ORAL_TABLET | Freq: Four times a day (QID) | ORAL | Status: DC | PRN
  Administered 2018-01-31 – 2018-02-03 (×6): 10 mg via ORAL
  Filled 2018-01-31 (×6): qty 2

## 2018-01-31 NOTE — Progress Notes (Signed)
  [facility] Progress Note     Subjective: CC-  No complaints this morning. NPWT applied yesterday to abdominal incision. Denies intraabdominal pain, tolerating diet.   Objective: Vital signs in last 24 hours: Temp:  [99 F (37.2 C)-99.5 F (37.5 C)] 99 F (37.2 C) (03/27 0247) Pulse Rate:  [101-102] 102 (03/27 0247) Resp:  [16] 16 (03/27 0247) BP: (142-158)/(92-99) 142/99 (03/27 0247) SpO2:  [98 %-99 %] 99 % (03/27 0247) Weight:  [425 lb 7.8 oz (193 kg)] 425 lb 7.8 oz (193 kg) (03/27 0247) Last BM Date: 01/30/18  Intake/Output from previous day: 03/26 0701 - 03/27 0700 In: 720 [P.O.:720] Out: 900 [Urine:900] Intake/Output this shift: Total I/O In: 240 [P.O.:240] Out: -   PE: Gen:  Alert, NAD, pleasant HEENT: EOM's intact, pupils equal and round Pulm:  effort normal Abd: obese, soft, NT, ND, wound vac in place with good seal, towel under pannus  Lab Results:  Recent Labs    01/29/18 1343  WBC 11.3*  HGB 9.3*  HCT 30.8*  PLT 604*   BMET Recent Labs    01/30/18 0741  NA 138  K 3.7  CL 103  CO2 28  GLUCOSE 117*  BUN 17  CREATININE 1.03  CALCIUM 8.6*   PT/INR No results for input(s): LABPROT, INR in the last 72 hours. CMP     Component Value Date/Time   NA 138 01/30/2018 0741   K 3.7 01/30/2018 0741   CL 103 01/30/2018 0741   CO2 28 01/30/2018 0741   GLUCOSE 117 (H) 01/30/2018 0741   BUN 17 01/30/2018 0741   CREATININE 1.03 01/30/2018 0741   CALCIUM 8.6 (L) 01/30/2018 0741   PROT 6.3 (L) 01/27/2018 0616   ALBUMIN 2.4 (L) 01/27/2018 0616   AST 35 01/27/2018 0616   ALT 14 (L) 01/27/2018 0616   ALKPHOS 63 01/27/2018 0616   BILITOT 1.2 01/27/2018 0616   GFRNONAA >60 01/30/2018 0741   GFRAA >60 01/30/2018 0741   Lipase     Component Value Date/Time   LIPASE 19 12/23/2016 0818       Studies/Results: No results found.  Anti-infectives: Anti-infectives (From admission, onward)   None       Assessment/Plan Tibial  plateau fracture -s/p ORIF, per ortho  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 Georgette Dover - incision healing well R abdominal wall soft tissue mass - s/p panniculectomy 01/19/18 Dr. Georgette Dover - POD#12 - sutures/staples likely ready for removal on Friday 3/29 (POD#14)  FEN: reg diet VTE: SCD, lovenox 95 mg Q24h ID: None currently. Ancef 3/12, 3/13, 3/15-16;3/20 Follow up: Dr. Georgette Dover  Dispo -Continue NPWT. Keep area under pannus dry with towel or interdry. Will reexamine wound tomorrow with vac removed.   LOS: 5 days    Wellington Hampshire , [facility] 01/31/2018, 9:28 AM Pager: [phone] Consults: [phone] Mon-Fri 7:00 am-4:30 pm Sat-Sun 7:00 am-11:30 am

## 2018-01-31 NOTE — Consult Note (Signed)
Neuropsychological Consultation   Patient:   Tyler Walters   DOB:   1966/08/05  MR Number:  [id number]  Location:  [facility] [id number] [place] [address] Dept: [facility]: [phone]           Date of Service:   01/31/2018  Start Time:   8 AM End Time:   9 AM  Provider/Observer:  Ilean Skill, Psy.D.       Clinical Neuropsychologist       Billing Code/Service: [id number] 4 Units  Chief Complaint:    Tyler Walters is a 52 year old male with long history of medical issues such as reported spider bite with larger wound and ankle trouble.  Presented 01/16/2018 after fall off ladder with left tibial plateau fracture.  Patient has long history of stress and depression due to not being able to work consistently and stress from finical difficulties.  He has tried for some time to get social security disability and medicare/medicaid to help address medical issues.  He is also in bankruptcy process.    Reason for Service:  Tyler Walters was referred for neuropsychological/psychological consultation due to mood disturbance including chronic depression with anxiety.  Below is the HPI for the current admission.  HPI:Tyler Walters a 52 y.o.malewho was admitted on 01/16/18 after a fall of step ladder with subsequent left tibial plateau fracture. He was taken to OR for placement of external fixator for stabilization. He was evaluated by Dr. Marcelino Walters who recommended fixation once edema improved. General Surgery consulted for input on growing abdominal mass which was felt to be likely a massive lipoma. He was taken to OR on 3/15 for excision of right chest wall and right abdominal mass with panniculectomy by Dr. Georgette Walters. He was taken to OR for ORIF left bicondylar tibial plateau, Left tibial shaft and repair of lateral meniscus by Dr. Doreatha Walters. Post op to be NWB LLE with hinged brace in place--brace to be  locked in extension at nights and unlocked during the day . He has had issues with pain control and Toradol was added yesterday with improvement in symptoms. Therapy ongoing and CIR recommended for follow up therapy.   Current Status:  Tyler Walters has long history of medical issues dating back years.  He reports initial problems due to necrotizing spider bite, fatty tumors and left ankle pain and swelling.  Inability to work due to pain and extending recovery from "spider bite".  Patient has been morbidly obese for many years but reports no weight issues prior to development of these medical issues.  Severe finicial issues and wife on disability with sons in school.  Depression and anxiety are chronic issues.   Behavioral Observation: Tyler Walters  presents as a 52 y.o.-year-old Right African American Male who appeared his stated age. his dress was Appropriate and he was Well Groomed and his manners were Appropriate to the situation.  his participation was indicative of Appropriate, Attentive and Redirectable behaviors.  There were physical disabilities noted.  he displayed an appropriate level of cooperation and motivation.     Interactions:    Active Appropriate, Attentive and Redirectable  Attention:   within normal limits and attention span and concentration were age appropriate  Memory:   within normal limits; recent and remote memory intact  Visuo-spatial:  not examined  Speech (Volume):  normal  Speech:   normal; normal  Thought Process:  Coherent and Relevant  Though Content:  WNL; not suicidal and not homicidal  Orientation:   person, place, time/date and situation  Judgment:   Fair  Planning:   Poor  Affect:    Anxious and Depressed  Mood:    Anxious and Dysphoric  Insight:   Fair  Intelligence:   normal  Marital Status/Living: Patient is married.  Wife disabled due to orthopedic issues.  Has two sons.  Current Employment: Patient not currently working.  Past  Employment:  Patient has worked in Architect prior.  Substance Use:  No concerns of substance abuse are reported.    Education:   HS Graduate  Medical History:   Past Medical History:  Diagnosis Date  . Lipoma of abdominal wall    Large lipoma   Psychiatric History:  While patient has not been treated he reports history of depression/anxiety with significant adjustment issues.  Family Med/Psych History:  Family History  Problem Relation Age of Onset  . Diabetes Mother   . Diabetes Father     Risk of Suicide/Violence: low Patient denies SI or HI>  Impression/DX:  Tyler Walters is a 52 year old male with long history of medical issues such as reported spider bite with larger wound and ankle trouble.  Presented 01/16/2018 after fall off ladder with left tibial plateau fracture.  Patient has long history of stress and depression due to not being able to work consistently and stress from finical difficulties.  He has tried for some time to get social security disability and medicare/medicaid to help address medical issues.  He is also in bankruptcy process.  Tyler Walters has long history of medical issues dating back years.  He reports initial problems due to necrotizing spider bite, fatty tumors and left ankle pain and swelling.  Inability to work due to pain and extending recovery from "spider bite".  Patient has been morbidly obese for many years but reports no weight issues prior to development of these medical issues.  Severe finicial issues and wife on disability with sons in school.  Depression and anxiety are chronic issues.    Diagnosis:   Chronic depression and anxiety issues.  Poor coping skills.        Electronically Signed   _______________________ Ilean Skill, Psy.D.

## 2018-01-31 NOTE — Progress Notes (Signed)
Occupational Therapy Session Note  Patient Details  Name: Tyler Walters MRN: [id number] Date of Birth: 01-24-1966  Today's Date: 01/31/2018 OT Individual Time: 1130-1200 OT Individual Time Calculation (min): 30 min    Short Term Goals: Week 1:  OT Short Term Goal 1 (Week 1): STGs = LTGs  Skilled Therapeutic Interventions/Progress Updates:    1:1 focus on endurance with propelling self in w/c to the ADL apartment from his room. Focus on d/c planing. Pt reports he will stay in the recliner for comfort and doesn't want a hospital bed. Pt reports family/ friends unable to build ramp for home to enter his home- discussed portable ramp from amp ramp but fiances are an issue. Pt perform min guard transfer from w/c to low couch and then min A with special setup for UE elevated surfaces to push up from. REports his couch/ recliner may be too low but "will figure it out." Transferred into bari chair with LEs elevated for comfort.  Ice applied to left LE.  Wife present in session./   Therapy Documentation Precautions:  Precautions Precautions: Fall Precaution Comments: lower abdominal wound/sutures Required Braces or Orthoses: Other Brace/Splint Other Brace/Splint: L Bledsoe brace unlocked during day; locked in extension at night or if in bed. No ROM restrictions Restrictions Weight Bearing Restrictions: Yes LLE Weight Bearing: Non weight bearing Pain: Soreness in in left LE See Function Navigator for Current Functional Status.   Therapy/Group: Individual Therapy  Willeen Cass [facility] 01/31/2018, 1:09 PM

## 2018-01-31 NOTE — Progress Notes (Signed)
[facility] PHYSICAL MEDICINE & REHABILITATION     PROGRESS NOTE    Subjective/Complaints: Patient reports no problems overnight.  His swelling is a bit better and left leg.  Vacuum now attached to abdominal wound  ROS: Patient denies fever, rash, sore throat, blurred vision, nausea, vomiting, diarrhea, cough, shortness of breath or chest pain, joint or back pain, headache, or mood change.   Objective: Vital Signs: Blood pressure (!) 142/99, pulse (!) 102, temperature 99 F (37.2 C), temperature source Oral, resp. rate 16, height 5\' 8"  (1.727 m), weight (!) 193 kg (425 lb 7.8 oz), SpO2 99 %. No results found. Recent Labs    01/29/18 1343  WBC 11.3*  HGB 9.3*  HCT 30.8*  PLT 604*   Recent Labs    01/30/18 0741  NA 138  K 3.7  CL 103  GLUCOSE 117*  BUN 17  CREATININE 1.03  CALCIUM 8.6*   CBG (last 3)  No results for input(s): GLUCAP in the last 72 hours.  Wt Readings from Last 3 Encounters:  01/31/18 (!) 193 kg (425 lb 7.8 oz)  01/24/18 (!) 147.9 kg (326 lb)    Physical Exam:  Constitutional: No distress . Vital signs reviewed. HEENT: EOMI, oral membranes moist Cardiovascular: RRR without murmur. No JVD    Respiratory: CTA Bilaterally without wheezes or rales. Normal effort    GI: BS +, non-tender, non-distended, vac attached with little to no drainage in canister Musculoskeletal: He exhibits2+ LLE edema--leg is wrapped in Ace wrap, perhaps slightly decreased in size today LLE limited by surgery and brace.   Neurological: He isalertand oriented to person, place, and time. Nocranial nerve deficit. Speech clear. Able to follow commands without difficulty. Moves BUE/RLE 4-5/5.  LLE grossly intact but limited by ortho Skin: Skin iswarmand dry in limbs.Marland Kitchen  Psychiatric: He has anormal mood and affect. Hisbehavior is normal.Judgmentand thought contentnormal.     Assessment/Plan: 1. Functional deficits secondary to left tibial plateau fx, morbid obesity  which require 3+ hours per day of interdisciplinary therapy in a comprehensive inpatient rehab setting. Physiatrist is providing close team supervision and 24 hour management of active medical problems listed below. Physiatrist and rehab team continue to assess barriers to discharge/monitor patient progress toward functional and medical goals.  Function:  Bathing Bathing position   Position: Wheelchair/chair at sink  Bathing parts Body parts bathed by patient: Right arm, Left arm, Chest, Abdomen, Front perineal area, Right upper leg, Left upper leg Body parts bathed by helper: Buttocks  Bathing assist        Upper Body Dressing/Undressing Upper body dressing   What is the patient wearing?: Pull over shirt/dress     Pull over shirt/dress - Perfomed by patient: Thread/unthread right sleeve, Thread/unthread left sleeve, Put head through opening, Pull shirt over trunk          Upper body assist Assist Level: Set up      Lower Body Dressing/Undressing Lower body dressing   What is the patient wearing?: Pants, Non-skid slipper socks     Pants- Performed by patient: Pull pants up/down Pants- Performed by helper: Thread/unthread right pants leg, Thread/unthread left pants leg   Non-skid slipper socks- Performed by helper: Don/doff right sock, Don/doff left sock                  Lower body assist        Toileting Toileting   Toileting steps completed by patient: Adjust clothing prior to toileting, Adjust clothing after toileting(wearing  hospital gown) Toileting steps completed by helper: Performs perineal hygiene Toileting Assistive Devices: Grab bar or rail, Toilet aid  Toileting assist Assist level: Touching or steadying assistance (Pt.75%)   Transfers Chair/bed transfer   Chair/bed transfer method: Stand pivot Chair/bed transfer assist level: Touching or steadying assistance (Pt > 75%) Chair/bed transfer assistive device: Armrests, Orthosis, Environmental health practitioner     Max distance: 10'  Assist level: Touching or steadying assistance (Pt > 75%)   Wheelchair   Type: Manual Max wheelchair distance: 233ft Assist Level: No help, No cues, assistive device, takes more than reasonable amount of time  Cognition Comprehension Comprehension assist level: Follows complex conversation/direction with no assist  Expression Expression assist level: Expresses complex ideas: With no assist  Social Interaction Social Interaction assist level: Interacts appropriately with others - No medications needed.  Problem Solving Problem solving assist level: Solves complex problems: Recognizes & self-corrects  Memory Memory assist level: Complete Independence: No helper   Medical Problem List and Plan: 1.Functional deficitssecondary totibial plateau fracture s/p ORIF and panniculectomy. NWB LLE with Bledsoe brace. Lock out brace when in bed.    -continue therapies   -Continue to work towards discharge by the end of the week 2. DVT Prophylaxis/Anticoagulation: Pharmaceutical:Lovenox   -dopplers pending 3. Pain Management:Continue Toradol for 5 total days. Oxycodone and robaxin prn. 4. Mood:LCSW to follow for evaluation and support. 5. Neuropsych: This patientiscapable of making decisions onhisown behalf. 6. Skin/Wound Care:VAC replaced yesterday after further drainage after getting up from the toilet.  Since being placed there is little to no drainage in the canister.  Further management per surgical team and wound nurse 7. Fluids/Electrolytes/Nutrition:good po, labs reviewed and within normal limits yesterday 8. ABLA: Hemoglobin 9.3. 9. Leucocytosis: Likely reactive. Monitor for signs of infection/ down to 11.3 on 01/30/2018   -Following serially.   10. Obesity: Encourage weight loss and life style changes to help promote mobility and overall health.  11. Constipation:  .    -working toward daily/qod schedule.  Had BM  yesterday   LOS (Days) [place] EVALUATION WAS PERFORMED  Meredith Staggers, MD 01/31/2018 8:24 AM

## 2018-01-31 NOTE — Progress Notes (Signed)
Occupational Therapy Session Note  Patient Details  Name: Tyler Walters MRN: [id number] Date of Birth: 02-01-1966  Today's Date: 01/31/2018 OT Individual Time: 1300-1400 OT Individual Time Calculation (min): 60 min    Short Term Goals: Week 1:  OT Short Term Goal 1 (Week 1): STGs = LTGs  Skilled Therapeutic Interventions/Progress Updates:    OT intervention with focus on BADL retraining including bathing/dressing with sit<>stand at sink.  Pt completes tasks without assistance except for B feet without AE. Pt used reacher to thread pants.  Pt requires more than a reasonable amount of time to complete tasks with multiple rest breaks.  Pt performs sit<>stand without assistance.  Pt amb with RW back to bariatric chair at supervision level and provided appropriate directions for therapist and pt's mom to assist with placement of pillows, etc under LLE.  Pt remained in chair with Mom present.    Therapy Documentation Precautions:  Precautions Precautions: Fall Precaution Comments: lower abdominal wound/sutures Required Braces or Orthoses: Other Brace/Splint Other Brace/Splint: L Bledsoe brace unlocked during day; locked in extension at night or if in bed. No ROM restrictions Restrictions Weight Bearing Restrictions: Yes LLE Weight Bearing: Non weight bearing  Pain:  Pt denies pain  See Function Navigator for Current Functional Status.   Therapy/Group: Individual Therapy  Leroy Libman 01/31/2018, 2:46 PM

## 2018-01-31 NOTE — Progress Notes (Addendum)
Physical Therapy Note  Patient Details  Name: Tyler Walters MRN: [id number] Date of Birth: July 11, 1966 Today's Date: 01/31/2018  0930-1050, 80 min individual tx Pain: 3/10 abdominal, premedicated Pt now with abdminal wound vac.   Pt stated that his ramp will not be installed before d/c.  Pt has 6 or 8 standard height steps with 2 rails to enter.  Pt has bumped up on his bottom , and pulled to stand on bil railings to stand up at top, due to R knee injury last year. PT educated pt on hopping up backwards, and attempted (1) 3" high step with bil rails, but pt did not know if he could do this. He stated that his brother, sons and others could lift him into the house if needed.  PT suggested rental ramps, but pt declined due to cost.   Pt seen for actual car transfer into his mother's small sedan.  Stand pivot transfer with supervison.with RW in/out of car, on rear driver's side, supported LLE on seat of car.  Wife and mother observed pt and understand technique.  Wife folded/unfolded RW independently.  W/c propulsion on level tile using bil UEs and RLE x 250' ,in out of elevator and on outdoor terrain, with supervision.  Stand pivot w/c> bariatric recliner with supervision.  Total assist needed for mgt of w/c legrests and recliner legrests.   See function navigator for current status.  [name] 01/31/2018, 7:54 AM

## 2018-01-31 NOTE — Progress Notes (Signed)
Physical Therapy Session Note  Patient Details  Name: Tyler Walters MRN: [id number] Date of Birth: [birth year]  Today's Date: 01/31/2018 PT Individual Time: 1300-1330 PT Individual Time Calculation (min): 30 min   Short Term Goals: Week 1:  PT Short Term Goal 1 (Week 1): =LTGs due to ELOS  Skilled Therapeutic Interventions/Progress Updates:    Pt seated in recliner chair in room, agreeable to participate in therapy session. Pt reports increased soreness in LLE due to transfer from low couch this AM, pain is not rated. Sit to stand with SBA to RW, stand pivot transfer recliner to w/c with RW and SBA. Pt requires assist for wound vac management. Manual w/c propulsion x 200 ft with SBA with BUE. Seated BLE therex x 10 reps, AAROM on LLE: marches, heel slides, LAQ, ankle pumps, hip abd/add. Pt left seated in w/c in therapy gym for OT session.  Therapy Documentation Precautions:  Precautions Precautions: Fall Precaution Comments: lower abdominal wound/sutures Required Braces or Orthoses: Other Brace/Splint Other Brace/Splint: L Bledsoe brace unlocked during day; locked in extension at night or if in bed. No ROM restrictions Restrictions Weight Bearing Restrictions: Yes LLE Weight Bearing: Non weight bearing  See Function Navigator for Current Functional Status.   Therapy/Group: Individual Therapy  Excell Seltzer, PT, DPT  01/31/2018, 2:20 PM

## 2018-02-01 ENCOUNTER — Encounter (HOSPITAL_COMMUNITY): Payer: Self-pay

## 2018-02-01 ENCOUNTER — Inpatient Hospital Stay (HOSPITAL_COMMUNITY): Payer: Self-pay | Admitting: Occupational Therapy

## 2018-02-01 ENCOUNTER — Inpatient Hospital Stay (HOSPITAL_COMMUNITY): Payer: Self-pay

## 2018-02-01 NOTE — Progress Notes (Signed)
Physical Therapy Session Note  Patient Details  Name: Tyler Walters MRN: [id number] Date of Birth: 18-Dec-1965  Today's Date: 02/01/2018 PT Individual Time: 1108-1208 PT Individual Time Calculation (min): 60 min   Short Term Goals: Week 1:  PT Short Term Goal 1 (Week 1): =LTGs due to ELOS  Skilled Therapeutic Interventions/Progress Updates:    Functional transfers and family education with pt's wife in regards to w/c management, wound vac management, and overall safety and able to complete with supervision and pt directing care as well as placement of bledsoe brace prior to mobility. Pt performed functional mobility at supervision level throughout session with RW including transfers, short distance gait, and gait x 50' with RW with focus on technique and overall endurance to home environment simulation. Instructed in seated LE therex for functional strengthening and ROM with 3# ankle weight on RLE and AAROM on L for LAQ, active assisted knee flexion on L, hip abduction squeezes x 5 second hold, and marches x 10 reps each BLE x 2 sets. Home entry discussed with pt continued plan to have family and friends carry him into the home. Ramp will be built in near future per report. Plans to stay at mom's house which has ramp until these family/friends can arrive after work to assist.  LTG for stairs and bed mobility d/c'd due to no longer appropriate goals. Pt plans to sleep in recliner.  Therapy Documentation Precautions:  Precautions Precautions: Fall Precaution Comments: wound vac on abdomen Required Braces or Orthoses: Other Brace/Splint Other Brace/Splint: L Bledsoe brace on during upright mobility; able to have off when in bed or chair. no ROM restrictions.  Restrictions Weight Bearing Restrictions: Yes LLE Weight Bearing: Non weight bearing    Pain:  Pain in abdomen and LLE due to just finished with wound care and MD assessing LLE. Premedicated.    See Function Navigator for Current  Functional Status.   Therapy/Group: Individual Therapy  Canary Brim Ivory Broad, PT, DPT  02/01/2018, 12:19 PM

## 2018-02-01 NOTE — Progress Notes (Signed)
Social Work Patient ID: Tyler Walters, male   DOB: [birth year], 52 y.o.   MRN: [id number]   CSW met with pt and his mother 01-31-18 and then with wife today to update them on team conference discussion.  They are comfortable with d/c date of 02-03-18 and pt has someone who will be building him a ramp, although it may not be done prior to 02-03-18.  His backup plan to enter the home will be to have 3-4 men available on day of d/c to help him up the front stairs of his home.  CSW arranged HH, DME, and wound VAC.  CSW also provided MATCH and other community resources to pt.  CSW will continue to follow and assist as needed.

## 2018-02-01 NOTE — Progress Notes (Signed)
Occupational Therapy Session Note  Patient Details  Name: Tyler Walters MRN: [id number] Date of Birth: 06-18-1966  Today's Date: 02/01/2018 OT Individual Time: 6644-0347 OT Individual Time Calculation (min): 75 min    Short Term Goals: Week 1:  OT Short Term Goal 1 (Week 1): STGs = LTGs  Skilled Therapeutic Interventions/Progress Updates:    Pt presents sitting up in bariatric recliner, agreeable to OT tx sesison. Pt reports pain at about 5/10, reports recently received pain meds from RN. Pt ambulating approx 7' within room using RW with MinGuard assist to transfer to w/c. Pt propels w/c throughout session at [place]. Discussion held regarding upcoming d/c and activity completion at home. In therapy apartment discussed basic mobility within kitchen from w/c and RW level. Pt reports he will have much support at home to assist. Pt engaging in simple iADL task ambulating short distance within kitchen with focus on dynamic reaching, standing balance and endurance. Pt obtaining and placing items from cabinets outside BOS using RW and counter for UE support. Pt completing standing activity with overall MinGuard assist. Transitioned to gym where pt engaged in seated UB/LB strengthening exercise seated on mat table. Pt completing UB exercise using 7lb hand weight for 15-20 reps each exercise, maintaining UE ROM below 90* shoulder flexion to reduce stretching of abdominal incision. Pt engaging in LLE leg raises, RLE leg raises and marching in place with 15lb ankle weight on RLE for 10-15x each set. Pt completing sequence of UB/LB exercises x2 with rest breaks PRN throughout. Pt returned to room and bariatric recliner in manner described above where pt was left seated with call bell and needs within reach.   Therapy Documentation Precautions:  Precautions Precautions: Fall Precaution Comments: wound vac on abdomen Required Braces or Orthoses: Other Brace/Splint Other Brace/Splint: L Bledsoe brace on during  upright mobility; able to have off when in bed or chair. no ROM restrictions.  Restrictions Weight Bearing Restrictions: Yes LLE Weight Bearing: Non weight bearing    See Function Navigator for Current Functional Status.   Therapy/Group: Individual Therapy  Raymondo Band 02/01/2018, 3:50 PM

## 2018-02-01 NOTE — Progress Notes (Signed)
Social Work Discharge Note  The overall goal for the admission was met for:   Discharge location: Yes - home  Length of Stay: Yes - 8 days  Discharge activity level: Yes - modified independent  Home/community participation: Yes  Services provided included: MD, RD, PT, OT, RN, Pharmacy, Neuropsych and SW  Financial Services: Other: self pay  Follow-up services arranged: Home Health: PT/OT/RN from [place], DME: 22"x18" heavy duty w/c with ELRs; bariatric rolling walker; bariatric bedside commode from [place] and Patient/Family has no preference for HH/DME agencies  Comments (or additional information):  Pt given MATCH and community resources.  CSW set up wound VAC with [facility] and established pt with new PCP.    Patient/Family verbalized understanding of follow-up arrangements: Yes  Individual responsible for coordination of the follow-up plan:  Pt and wife  Confirmed correct DME delivered: Trey Sailors 02/01/2018    Esther Broyles, Silvestre Mesi

## 2018-02-01 NOTE — Progress Notes (Signed)
  [facility] Progress Note     Subjective: CC-  Doing well. Planning to be discharged home this Saturday. Tolerating dressing changes well.  Objective: Vital signs in last 24 hours: Temp:  [98.8 F (37.1 C)-99.4 F (37.4 C)] 99.4 F (37.4 C) (03/28 0458) Pulse Rate:  [97-98] 97 (03/28 0458) Resp:  [16-18] 16 (03/28 0458) BP: (137-152)/(84-98) 137/84 (03/28 0458) SpO2:  [98 %] 98 % (03/28 0458) Last BM Date: 01/30/18  Intake/Output from previous day: 03/27 0701 - 03/28 0700 In: 720 [P.O.:720] Out: 725 [Urine:725] Intake/Output this shift: No intake/output data recorded.  PE: Gen: Alert, NAD, pleasant, foul odor in room HEENT: EOM's intact, pupils equal and round Pulm: effort normal FHQ:RFXJO, soft, no cellulitis, serosanguinous drainage from central and left side of wound with macerated skin         Lab Results:  Recent Labs    01/29/18 1343  WBC 11.3*  HGB 9.3*  HCT 30.8*  PLT 604*   BMET Recent Labs    01/30/18 0741  NA 138  K 3.7  CL 103  CO2 28  GLUCOSE 117*  BUN 17  CREATININE 1.03  CALCIUM 8.6*   PT/INR No results for input(s): LABPROT, INR in the last 72 hours. CMP     Component Value Date/Time   NA 138 01/30/2018 0741   K 3.7 01/30/2018 0741   CL 103 01/30/2018 0741   CO2 28 01/30/2018 0741   GLUCOSE 117 (H) 01/30/2018 0741   BUN 17 01/30/2018 0741   CREATININE 1.03 01/30/2018 0741   CALCIUM 8.6 (L) 01/30/2018 0741   PROT 6.3 (L) 01/27/2018 0616   ALBUMIN 2.4 (L) 01/27/2018 0616   AST 35 01/27/2018 0616   ALT 14 (L) 01/27/2018 0616   ALKPHOS 63 01/27/2018 0616   BILITOT 1.2 01/27/2018 0616   GFRNONAA >60 01/30/2018 0741   GFRAA >60 01/30/2018 0741   Lipase     Component Value Date/Time   LIPASE 19 12/23/2016 0818       Studies/Results: No results found.  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 Georgette Dover - incision healing well R abdominal wall soft tissue mass - s/p panniculectomy 01/19/18 Dr. Georgette Dover - POD#19 - Few staples/sutures removed but I'm concerned the wound will open up if all are removed. No signs of cellulitis or infection. Will leave remaining sutures/staples and reapply superficial Provena-like NPWT. Will ask CSW to submit to insurance to see if patient could get approved for home wound vac; if this cannot get approved he will need to go home with BID dry dressing changes and PRN for saturation. He is likely being discharged home from CIR on Saturday 02/03/18. Keep area under pannus dry with towel or interdry.  FEN: reg diet VTE: SCD, lovenox 95 mg Q24h IT:GPQD currently.Ancef 3/12, 3/13, 3/15-16;3/20 Follow up: Dr. Georgette Dover next week    LOS: 6 days    Wellington Hampshire , [facility] 02/01/2018, 10:03 AM Pager: [phone] Consults: [phone] Mon-Fri 7:00 am-4:30 pm Sat-Sun 7:00 am-11:30 am

## 2018-02-01 NOTE — Progress Notes (Signed)
Orthopaedic Trauma Progress Note  S: Currently doing well.  Pain controlled in his left leg.  Unfortunately the brace is not fitting due to his habitus and size of his leg.  O: General: No acute distress, awake alert and oriented x3 Left lower extremity dressing taken down which reveals a well-healing incision with percutaneous incision.  The proximal ex-fix pin site has a little bit of exudative material but appears nonerythematous and not currently fluctuant.  The distal pin sites have a little bit of serous drainage but overall does not appear to be infected.  Active dorsiflexion plantarflexion with endorsing sensation to the dorsum and plantar aspect of his foot.  A/P: 52 year old male status post ORIF of complex left bicondylar tibial plateau fracture with tibial shaft extension.  Weightbearing: NWB LLE Insicional and dressing care: OK to remove dressings about knee and leave open to air with dry gauze PRN Orthopedic device(s): Patient does not need to wear a brace while sleeping.  I would request that he has it when he is up and about however it is ill fitting and unfortunately not doing a very good job.  It should not hinder his ability to participate in therapy if the brace is not on. Showering: Okay to shower from an orthopedic perspective but will defer to the general trauma service VTE prophylaxis: Lovenox 40mg  qd For 1 month Pain control: Per rehab doctors Follow - up plan: Would like to see the patient on April 8 or 9 in clinic for suture removal and x-rays Contact information: Patient was provided business card to contact to make appointment  Shona Needles, MD [facility] [phone] (phone)

## 2018-02-01 NOTE — Progress Notes (Signed)
Occupational Therapy Note  Patient Details  Name: KALA GASSMANN MRN: [id number] Date of Birth: 1966-09-05  Today's Date: 02/01/2018 OT Missed Time: 43 Minutes Missed Time Reason: Nursing care  Pt missed 60 mins skilled OT services secondary to [facility] RN providing care.   Leotis Shames [facility] 02/01/2018, 10:59 AM

## 2018-02-01 NOTE — Patient Care Conference (Signed)
Inpatient RehabilitationTeam Conference and Plan of Care Update Date: 01/30/2018   Time: 2:15 PM    Patient Name: Tyler Walters      Medical Record Number: [id number]  Date of Birth: 03-05-1966 Sex: Male         Room/Bed: 4W02C/4W02C-01 Payor Info: Payor: MEDICAID POTENTIAL / Plan: MEDICAID POTENTIAL / Product Type: *No Product type* /    Admitting Diagnosis: Fall, TiB plateau FX  Admit Date/Time:  01/26/2018  5:05 PM Admission Comments: No comment available   Primary Diagnosis:  Closed bicondylar fracture of left tibial plateau Principal Problem: Closed bicondylar fracture of left tibial plateau  Patient Active Problem List   Diagnosis Date Noted  . Closed fracture of left tibial plateau 01/26/2018  . Pannus, abdominal 01/19/2018  . Lipoma of abdominal wall   . Closed bicondylar fracture of left tibial plateau 01/16/2018  . Morbid obesity ([place]) 06/25/2009  . CELLULITIS AND ABSCESS OF TRUNK 06/25/2009    Expected Discharge Date: Expected Discharge Date: 02/03/18  Team Members Present: Physician leading conference: Dr. Alger Simons Social Worker Present: Lennart Pall, LCSW Nurse Present: Junius Creamer, RN PT Present: Kem Parkinson, PT OT Present: Roanna Epley, [place], OT SLP Present: Charolett Bumpers, SLP PPS Coordinator present : Daiva Nakayama, RN, CRRN     Current Status/Progress Goal Weekly Team Focus  Medical   Admitted for left tibial plateau fracture, status post panniculectomy, morbidly obese  Stabilized medically for discharge  Wound care, edema management, pain control   Bowel/Bladder   continent of bowel and bladder LBM 01-29-18  remain continet of bowel and bladder, maintain regular bowel pattern   Assist with toileting need prn   Swallow/Nutrition/ Hydration             ADL's   steady A/min A overall  bathing-supervision; LB dressing-min A; toilet transfers/toileting-mod I  standing balance, functional transfers, AE use, education   Mobility   min assist with RW transfers and gait about 40'; limited Mon due to brace not fitting  mod I/supervision overall  transfers, strengthening, endurance, gait, balance, d/c planning, real car transfer (scheduled for Wed)   Communication             Safety/Cognition/ Behavioral Observations            Pain   pain managed with schedule toradol q6 and prn oxy   pain < or =3  Assess pain q shift and prn and treat prn   Skin   abd incision suture/staples inter dry in use, left leg with sutures and dry dressing   no new skin issues  Assess skin q shift and prn    Rehab Goals Patient on target to meet rehab goals: Yes Rehab Goals Revised: none *See Care Plan and progress notes for long and short-term goals.     Barriers to Discharge  Current Status/Progress Possible Resolutions Date Resolved   Physician    Wound Care        Ongoing management of problems documented in chart and above      Nursing                  PT  Inaccessible home environment  7 steps to enter house, family/friends plannning to build ramp              OT                  SLP  SW                Discharge Planning/Teaching Needs:  Pt to return to his home with his wife and 74 y/o granddtr.  [name] has been present for family education.   Team Discussion:  Pt still has d/c from his abdominal wound and WOC RN applied VAC on Tuesday.  Pt is continent.  Pt is supervision with OT with mod I goals.  Pt may need min A with some ADLs.  Pt is ambulating currently with min A to S for short distances, with mod I goals.  Revisions to Treatment Plan:  none    Continued Need for Acute Rehabilitation Level of Care: The patient requires daily medical management by a physician with specialized training in physical medicine and rehabilitation for the following conditions: Daily direction of a multidisciplinary physical rehabilitation program to ensure safe treatment while eliciting the highest outcome that is of  practical value to the patient.: Yes Daily medical management of patient stability for increased activity during participation in an intensive rehabilitation regime.: Yes Daily analysis of laboratory values and/or radiology reports with any subsequent need for medication adjustment of medical intervention for : Post surgical problems;Wound care problems  Tyler Walters, Tyler Walters 02/01/2018, 1:45 PM

## 2018-02-01 NOTE — Progress Notes (Signed)
[place] PHYSICAL MEDICINE & REHABILITATION     PROGRESS NOTE    Subjective/Complaints: Slept well. Minimal drainage through vac. States that ramp won't be ready for discharge. Had difficulty with stairs.   ROS: Patient denies fever, rash, sore throat, blurred vision, nausea, vomiting, diarrhea, cough, shortness of breath or chest pain, joint or back pain, headache, or mood change.   Objective: Vital Signs: Blood pressure 137/84, pulse 97, temperature 99.4 F (37.4 C), temperature source Oral, resp. rate 16, height 5\' 8"  (1.727 m), weight (!) 193 kg (425 lb 7.8 oz), SpO2 98 %. No results found. Recent Labs    01/29/18 1343  WBC 11.3*  HGB 9.3*  HCT 30.8*  PLT 604*   Recent Labs    01/30/18 0741  NA 138  K 3.7  CL 103  GLUCOSE 117*  BUN 17  CREATININE 1.03  CALCIUM 8.6*   CBG (last 3)  No results for input(s): GLUCAP in the last 72 hours.  Wt Readings from Last 3 Encounters:  01/31/18 (!) 193 kg (425 lb 7.8 oz)  01/24/18 (!) 147.9 kg (326 lb)    Physical Exam:  Constitutional: No distress . Vital signs reviewed. HEENT: EOMI, oral membranes moist Cardiovascular: RRR without murmur. No JVD    Respiratory: CTA Bilaterally without wheezes or rales. Normal effort    GI: BS +, non-tender, non-distended, vac attached to abdomen with minimal drainage. Some drainage from left JP drain site Musculoskeletal: He exhibits2+ LLE edema--leg is wrapped in Ace wrap, decreased edema.  LLE limited by surgery and brace.   Neurological: He isalertand oriented to person, place, and time. Nocranial nerve deficit. Speech clear. Able to follow commands without difficulty. Moves BUE/RLE 4-5/5.  LLE limited by brace. Skin: Skin iswarmand dry in limbs.Marland Kitchen  Psychiatric: He has anormal mood and affect. Hisbehavior is normal.Judgmentand thought contentnormal.     Assessment/Plan: 1. Functional deficits secondary to left tibial plateau fx, morbid obesity which require 3+ hours  per day of interdisciplinary therapy in a comprehensive inpatient rehab setting. Physiatrist is providing close team supervision and 24 hour management of active medical problems listed below. Physiatrist and rehab team continue to assess barriers to discharge/monitor patient progress toward functional and medical goals.  Function:  Bathing Bathing position   Position: Wheelchair/chair at sink  Bathing parts Body parts bathed by patient: Right arm, Left arm, Chest, Abdomen, Front perineal area, Right upper leg, Left upper leg, Buttocks Body parts bathed by helper: Buttocks  Bathing assist Assist Level: Touching or steadying assistance(Pt > 75%)      Upper Body Dressing/Undressing Upper body dressing   What is the patient wearing?: Pull over shirt/dress     Pull over shirt/dress - Perfomed by patient: Thread/unthread right sleeve, Thread/unthread left sleeve, Put head through opening, Pull shirt over trunk          Upper body assist Assist Level: Set up      Lower Body Dressing/Undressing Lower body dressing   What is the patient wearing?: Pants, Non-skid slipper socks     Pants- Performed by patient: Thread/unthread right pants leg, Thread/unthread left pants leg, Pull pants up/down Pants- Performed by helper: Thread/unthread right pants leg, Thread/unthread left pants leg   Non-skid slipper socks- Performed by helper: Don/doff right sock, Don/doff left sock                  Lower body assist Assist for lower body dressing: Touching or steadying assistance (Pt > 75%)  Toileting Toileting   Toileting steps completed by patient: Adjust clothing prior to toileting, Adjust clothing after toileting(wearing hospital gown) Toileting steps completed by helper: Performs perineal hygiene Toileting Assistive Devices: Grab bar or rail, Toilet aid  Toileting assist Assist level: Touching or steadying assistance (Pt.75%)   Transfers Chair/bed transfer   Chair/bed  transfer method: Stand pivot Chair/bed transfer assist level: Supervision or verbal cues Chair/bed transfer assistive device: Armrests, Walker, Orthosis     Locomotion Ambulation     Max distance: 10 Assist level: Touching or steadying assistance (Pt > 75%)   Wheelchair   Type: Manual Max wheelchair distance: 250 Assist Level: Supervision or verbal cues  Cognition Comprehension Comprehension assist level: Follows complex conversation/direction with no assist  Expression Expression assist level: Expresses complex ideas: With no assist  Social Interaction Social Interaction assist level: Interacts appropriately with others - No medications needed.  Problem Solving Problem solving assist level: Solves complex problems: Recognizes & self-corrects  Memory Memory assist level: Complete Independence: No helper   Medical Problem List and Plan: 1.Functional deficitssecondary totibial plateau fracture s/p ORIF and panniculectomy. NWB LLE with Bledsoe brace. Lock out brace when in bed.    -continue therapies   -Continue to work towards discharge 02/03/18. Stairs an obstacle  2. DVT Prophylaxis/Anticoagulation: Pharmaceutical:Lovenox   -dopplers still not done 3. Pain Management:Continue Toradol for 5 total days. Oxycodone and robaxin prn. 4. Mood:LCSW to follow for evaluation and support. 5. Neuropsych: This patientiscapable of making decisions onhisown behalf. 6. Skin/Wound Care:VAC per surgery. Minimal output in cannister.   -staples out per surgery  7. Fluids/Electrolytes/Nutrition:good po  8. ABLA: Hemoglobin 9.3. 9. Leucocytosis: Likely reactive. Monitor for signs of infection/ down to 11.3 on 01/30/2018   -Following serially.   10. Obesity: Encourage weight loss and life style changes to help promote mobility and overall health.  11. Constipation:  .    -moving bowels more regularly.    LOS (Days) [place] EVALUATION WAS PERFORMED  Meredith Staggers,  MD 02/01/2018 8:08 AM

## 2018-02-01 NOTE — Consult Note (Signed)
[place] Nurse wound follow up Wound type: surgical  Measurement: 0.1cm x 76cm x 2 open areas along the right side of the pannus ( most lateral opening 1.5cm x 3.0cm x 4.0cm) (more proximal area; 0.2cm x 0.3cm x 3.5cm) Wound FJF:JKNIOC line with some skin moisture noted, medially has several areas that appear denuded and bleed some with dressing change. Suture line to the right side has two openings, unable to visualize wound bed. At the most proximal opening the surrounding tissue has some partial thickness skin loss with some maceration and slough/fibrin Drainage (amount, consistency, odor) serous in canister, foul odor in room. CCS and I agree like hygiene foul.  Brooke PA opened two areas today, the most lateral has some thick yellow/brown drainage. The more medial opening has serous drainage only  Periwound: intact but moist Dressing procedure/placement/frequency: Applied mepitel over the entire intact stapled incision. Pack 1 pc of black foam into each opening.  Used one continuous piece of black foam along the closed incision. Sealed with drape.  Two TRAC pads used along the incision, Y connected to NPWT device. Patient tolerated well.  Will have staff hook to home unit if approved on Saturday prior to discharge. If home unit not approved will have staff teach patient to apply dry dressings 3x per day and PRN saturation. Recommended the use of peri pads for cost efficiency. Met with CM to discuss needs for [facility] and home VAC, she will fax for approval to [facility], however patient has no insurance.  CCS has signed paperwork for [facility] as well.   Discussed POC with patient and bedside nurse.  Re consult if needed, will not follow at this time. Thanks  Delrose Rohwer [employer], RN,CWOCN, CNS, [place] [phone])

## 2018-02-02 ENCOUNTER — Inpatient Hospital Stay (HOSPITAL_COMMUNITY): Payer: Self-pay | Admitting: Occupational Therapy

## 2018-02-02 ENCOUNTER — Inpatient Hospital Stay (HOSPITAL_COMMUNITY): Payer: Self-pay

## 2018-02-02 ENCOUNTER — Inpatient Hospital Stay (HOSPITAL_COMMUNITY): Payer: Self-pay | Admitting: Physical Therapy

## 2018-02-02 ENCOUNTER — Inpatient Hospital Stay (HOSPITAL_COMMUNITY): Payer: Medicaid Other | Admitting: Physical Therapy

## 2018-02-02 MED ORDER — ENOXAPARIN SODIUM 100 MG/ML ~~LOC~~ SOLN: 90.0000 mg | SUBCUTANEOUS | 0 refills | Status: DC

## 2018-02-02 MED ORDER — METHOCARBAMOL 750 MG PO TABS: 750.0000 mg | ORAL_TABLET | Freq: Four times a day (QID) | ORAL | 0 refills | Status: DC | PRN

## 2018-02-02 MED ORDER — TAMSULOSIN HCL 0.4 MG PO CAPS: 0.4000 mg | ORAL_CAPSULE | Freq: Every day | ORAL | 0 refills | Status: DC

## 2018-02-02 MED ORDER — OXYCODONE HCL 5 MG PO TABS: 5.0000 mg | ORAL_TABLET | Freq: Four times a day (QID) | ORAL | 0 refills | Status: DC | PRN

## 2018-02-02 MED ORDER — ACETAMINOPHEN 325 MG PO TABS: 325.0000 mg | ORAL_TABLET | ORAL | Status: DC | PRN

## 2018-02-02 NOTE — Progress Notes (Signed)
Occupational Therapy Session Note  Patient Details  Name: GABLE ODONOHUE MRN: [id number] Date of Birth: [birth year]  Today's Date: 02/02/2018 OT Individual Time: 1515-1600 45 min   Skilled Therapeutic Interventions/Progress Updates:    1:1. Pt agreeable to tx session. Spent significant amount of time educating patient on energy conservation strategies, discussing home set up and situations pt nervous about. Pt propels w/c to/from ADL apartment. Pt given reacher bag and walker bag and educated on purposes/uses for energy conservation around home. Pt completes kitchen search activity ambulating around kitchen for items and placing into walker bag with VC for positioning walker around appliances. Exited session with pt seated in bariatric recliner with call light in reach and wife present.   Therapy Documentation Precautions:  Precautions Precautions: Fall Precaution Comments: wound vac on abdomen Required Braces or Orthoses: Other Brace/Splint Other Brace/Splint: L Bledsoe brace on during upright mobility; able to have off when in bed or chair. no ROM restrictions.  Restrictions Weight Bearing Restrictions: Yes LLE Weight Bearing: Non weight bearing  See Function Navigator for Current Functional Status.   Therapy/Group: Individual Therapy  Tonny Branch 02/02/2018, 4:41 PM

## 2018-02-02 NOTE — Discharge Instructions (Signed)
Inpatient Rehab Discharge Instructions  Tyler Walters Discharge date and time:  02/02/18  Activities/Precautions/ Functional Status: Activity: no lifting, driving, or strenuous exercise  till cleared by MD. Touch down weight on Left leg. Diet: low fat, low cholesterol diet Wound Care: Home health nurse to change VAC three times a week. Contact MD if you develop any problems with your incision/wound--redness, swelling, increase in pain, drainage or if you develop fever or chills.     Functional status:  ___ No restrictions     ___ Walk up steps independently ___ 24/7 supervision/assistance   ___ Walk up steps with assistance ___ Intermittent supervision/assistance  ___ Bathe/dress independently ___ Walk with walker     ___ Bathe/dress with assistance ___ Walk Independently    ___ Shower independently ___ Walk with assistance    ___ Shower with assistance ___ No alcohol     ___ Return to work/school ________   COMMUNITY REFERRALS UPON DISCHARGE:   Home Health:   PT     OT     RN  Agency:  [facility] Phone:  [phone] Medical Equipment/Items Ordered:  22"x18" wheelchair with elevating leg rest and basic cushion; rolling walker; bedside commode  Agency/Supplier:  [employer]          Phone:  [phone]   Other:  [facility] for wound VAC; [place] program for medications for one month, then follow up at [facility] after that for assistance.   GENERAL COMMUNITY RESOURCES FOR PATIENT/FAMILY: See community resource packet Sonia Baller gave you.  Special Instructions: 1. Keep brace on left leg when walking.  2. Wash knee incision with soap and water, pat dry. No lotions, ointments or oils. 3.   My questions have been answered and I understand these instructions. I will adhere to these goals and the provided educational materials after my discharge from the hospital.  Patient/Caregiver Signature _______________________________ Date __________  Clinician  Signature _______________________________________ Date __________  Please bring this form and your medication list with you to all your follow-up doctor's appointments.

## 2018-02-02 NOTE — Discharge Summary (Signed)
Physician Discharge Summary  Patient ID: Tyler Walters MRN: [id number] DOB/AGE: 1966/09/05 52 y.o.  Admit date: 01/26/2018 Discharge date: 02/03/2018  Discharge Diagnoses:  Principal Problem:   Closed bicondylar fracture of left tibial plateau Active Problems:   Morbid obesity ([place])   Lipoma of abdominal wall   Pannus, abdominal   Drainage from wound--abdominal site   Leg edema, left   Discharged Condition:  Stable.   Significant Diagnostic Studies: N/A   Labs:  Basic Metabolic Panel: BMP Latest Ref Rng & Units 01/30/2018 01/27/2018 01/24/2018  Glucose 65 - 99 mg/dL 117(H) 101(H) 96  BUN 6 - 20 mg/dL 17 15 13   Creatinine 0.61 - 1.24 mg/dL 1.03 0.98 0.93  Sodium 135 - 145 mmol/L 138 135 135  Potassium 3.5 - 5.1 mmol/L 3.7 4.2 3.8  Chloride 101 - 111 mmol/L 103 99(L) 99(L)  CO2 22 - 32 mmol/L 28 25 27   Calcium 8.9 - 10.3 mg/dL 8.6(L) 8.0(L) 8.2(L)    CBC: CBC Latest Ref Rng & Units 01/29/2018 01/27/2018 01/26/2018  WBC 4.0 - 10.5 K/uL 11.3(H) 11.6(H) 11.3(H)  Hemoglobin 13.0 - 17.0 g/dL 9.3(L) 9.8(L) 8.5(L)  Hematocrit 39.0 - 52.0 % 30.8(L) 32.1(L) 28.3(L)  Platelets 150 - 400 K/uL 604(H) 236 484(H)    CBG: No results for input(s): GLUCAP in the last 168 hours.  Brief HPI:   Tyler Walters is a 52 y.o. male who was admitted on 01/16/18 after a fall of step ladder with subsequent left tibial plateau fracture. He was taken to OR for placement of external fixator for stabilization. He was evaluated by Dr. Marcelino Scot who recommended fixation once edema improved. General  Surgery consulted for input on growing abdominal mass which was felt to be likely a massive lipoma. He was taken to OR on  3/15 for excision of right chest wall and right abdominal mass with panniculectomy by Dr. Georgette Dover. He was taken to OR for ORIF left bicondylar tibial plateau,  Left tibial shaft and repair of lateral meniscus by Dr. Doreatha Martin. Post op to be NWB LLE with hinged brace in place--brace to be locked in extension  at nights and unlocked during the day . He has had issues with pain control and Toradol was added yesterday with improvement in symptoms. Therapy ongoing and CIR recommended for follow up therapy.    Hospital Course: Tyler Walters was admitted to rehab 01/26/2018 for inpatient therapies to consist of PT and OT at least three hours five days a week. Past admission physiatrist, therapy team and rehab RN have worked together to provide customized collaborative inpatient rehab. He was maintained  NWB LLE with SQ Lovenox for DVT prophylaxis. He had increase in edema LLE and this was treated with local measures of elevation as well as ACE wrap for compression. Bledsoe brace straps had to be adjusted due to edema but currently remains ill fitting due to body habitus and edema. Dr. Doreatha Martin has followed for input and cleared patient to remove brace when in bed and to continue use for support with mobility. Left knee and LE incisions areC/D/I and are healing  without S/S of infection.  Pain control has improved and he is currently using Oxycodone prn for pain management. Bowel program was augmented to help with OIC and patient educated on modification at home as narcotics weaned off.  He is continent of bowel and bladder. Serial CBC showed that ABLA is resolving and reactive leucocytosis is stable. He started developing drainage as well as maceration of wound  under abdominal pannus. Surgery and WOC was consulted for input and incisional VAC was added to help manage drainage. Silver Alginate sheets were ineffective in absorbing drainage and patient advised to use towel additional to help with moisture control. Wound VAC was ordered for home use and he is to follow up with general surgery on Monday for dressing change. He has made good progress and modified independent for mobility. He was set up with [facility] for medication assistance as well as charity care with [facility] for wound care. He will continue to receive follow up HHPT,  [place] and [facility] [place] after discharge.    Rehab course: During patient's stay in rehab weekly team conferences were held to monitor patient's progress, set goals and discuss barriers to discharge. At admission, patient required max assist with basic self care tasks and min assist with mobility. Speech therapy evaluation showed high cognitive linguistic abilities and ST was warranted during his stay. He  has had improvement in activity tolerance, balance, postural control as well as ability to compensate for deficits. He requires min assist for LB dressing without AE. He is able to complete bathing and UB dressing with supervision. He is able to transfer at modified independent level and is able to ambulate 17' with RW.  He is able to propel his wheelchair at modified independent level. He has been educated on energy conservation measures as well as HEP and safety. Family education was completed with wife who will assist as needed after discharge.     Disposition:  Home  Diet: Low fat/Low cholesterol  Special Instructions: 1. No weight on Left leg.  2. HHRN to change VAC on abdomen three times a week or per General surgery recommendations. 3.  Wear brace LLE when walking.   Discharge Instructions    Ambulatory referral to Physical Medicine Rehab   Complete by:  As directed    1-2 weeks transitional care appt     Allergies as of 02/03/2018      Reactions   Morphine And Related Nausea Only      Medication List    STOP taking these medications   docusate sodium 100 MG capsule Commonly known as:  COLACE     TAKE these medications   acetaminophen 325 MG tablet Commonly known as:  TYLENOL Take 1-2 tablets (325-650 mg total) by mouth every 4 (four) hours as needed for mild pain.   enoxaparin 100 MG/ML injection Commonly known as:  LOVENOX Inject 0.9 mLs (90 mg total) into the skin daily.   methocarbamol 750 MG tablet Commonly known as:  ROBAXIN Take 1 tablet (750 mg  total) by mouth every 6 (six) hours as needed for muscle spasms.   oxyCODONE 5 MG immediate release tablet--Rx # 52 pills Commonly known as:  Oxy IR/ROXICODONE Take 1-2 tablets (5-10 mg total) by mouth every 6 (six) hours as needed for severe pain. What changed:    how much to take  when to take this   tamsulosin 0.4 MG Caps capsule Commonly known as:  FLOMAX Take 1 capsule (0.4 mg total) by mouth daily after supper.      Follow-up Information    Meredith Staggers, MD Follow up.   Specialty:  Physical Medicine and Rehabilitation Why:  office will call you with follow up appointment Contact information: [address] [place] [address] [phone]        Donnie Mesa, MD. Go on 02/05/2018.   Specialty:  General Surgery  Why:  Your appointment is 02/05/2018 at 9:00AM. Please arrive 30 minutes prior to your appointment to check in and fill out paperwork. Bring photo ID and insurance information. Contact information: [address] [place] [address] [phone]        Clent Demark, PA-C. Go on 02/15/2018.   Specialty:  Physician Assistant Why:  @ 10:20 AM Contact information: [address] [place] [address] [phone]        Shona Needles, MD. Call.   Specialty:  Orthopedic Surgery Why:  for follow up appointment for suture removal (knee) Contact information: [address] [place] [address] [phone]           Signed: Bary Leriche 02/04/2018, 11:27 AM

## 2018-02-02 NOTE — Progress Notes (Signed)
Occupational Therapy Discharge Summary  Patient Details  Name: Tyler Walters MRN: [id number] Date of Birth: 05-04-1966   Patient has met 5 of 5 long term goals due to improved activity tolerance, improved balance, postural control, ability to compensate for deficits and improved adherance to WB precautions.  Pt made steady progress with BADLs and functional transfers during this admission.  Pt requires min A for LB dressing tasks without AE and completes all other bathing/dressing tasks at supervision level.  Pt performs toilet transfers and toileting tasks at mod I level with RW.  [name] has been present for therapy and provides the appropriate level of supervision/assistance. Patient to discharge at overall Supervision-MOD I level.  Patient's care partner is independent to provide the necessary supervision assistance at discharge.     Recommendation: no further OT services recommended  Equipment: Bariatric [facility]  Reasons for discharge: treatment goals met  Patient/family agrees with progress made and goals achieved: Yes  OT Discharge Precautions/Restrictions  Precautions Precaution Comments: wound vac on abdomen Other Brace/Splint: L Bledsoe brace on during upright mobility; able to have off when in bed or chair. no ROM restrictions.  Restrictions Weight Bearing Restrictions: Yes LLE Weight Bearing: Non weight bearing   Vision Baseline Vision/History: Wears glasses Wears Glasses: At all times Patient Visual Report: No change from baseline Vision Assessment?: No apparent visual deficits Perception  Perception: Within Functional Limits Praxis Praxis: Intact Cognition Overall Cognitive Status: Within Functional Limits for tasks assessed Arousal/Alertness: Awake/alert Orientation Level: Oriented X4 Memory: Appears intact Awareness: Appears intact Problem Solving: Appears intact Safety/Judgment: Appears intact Sensation Sensation Light Touch: Appears Intact Stereognosis:  Appears Intact Hot/Cold: Appears Intact Proprioception: Appears Intact Coordination Gross Motor Movements are Fluid and Coordinated: Yes Fine Motor Movements are Fluid and Coordinated: Yes Motor  Motor Motor: Within Functional Limits    Trunk/Postural Assessment  Cervical Assessment Cervical Assessment: Within Functional Limits Thoracic Assessment Thoracic Assessment: Within Functional Limits Lumbar Assessment Lumbar Assessment: Within Functional Limits Postural Control Postural Control: Within Functional Limits  Balance Static Sitting Balance Static Sitting - Balance Support: No upper extremity supported Static Sitting - Level of Assistance: 6: Modified independent (Device/Increase time) Dynamic Sitting Balance Dynamic Sitting - Balance Support: No upper extremity supported Dynamic Sitting - Level of Assistance: 6: Modified independent (Device/Increase time) Static Standing Balance Static Standing - Balance Support: Right upper extremity supported Static Standing - Level of Assistance: 6: Modified independent (Device/Increase time) Dynamic Standing Balance Dynamic Standing - Balance Support: Right upper extremity supported Dynamic Standing - Level of Assistance: 6: Modified independent (Device/Increase time) Extremity/Trunk Assessment RUE Assessment RUE Assessment: Within Functional Limits LUE Assessment LUE Assessment: Within Functional Limits   See Function Navigator for Current Functional Status.  Leotis Shames [facility] 02/02/2018, 1:32 PM

## 2018-02-02 NOTE — Progress Notes (Signed)
  [facility] Progress Note     Subjective: CC-  Patient progressing well. States that his abdomen was more sore yesterday after applying the new vac, feeling better today. Planning to be discharged home tomorrow. CSW has gotten wound vac approved for home.  Objective: Vital signs in last 24 hours: Temp:  [98 F (36.7 C)-98.6 F (37 C)] 98 F (36.7 C) (03/29 0700) Pulse Rate:  [98-110] 98 (03/29 0700) Resp:  [17-18] 17 (03/29 0700) BP: (137-148)/(82-89) 137/89 (03/29 0700) SpO2:  [99 %-100 %] 99 % (03/29 0700) Last BM Date: 02/01/18  Intake/Output from previous day: 03/28 0701 - 03/29 0700 In: 1710 [P.O.:1710] Out: 1050 [Urine:1050] Intake/Output this shift: No intake/output data recorded.  PE: Gen: Alert, NAD, pleasant HEENT: EOM's intact, pupils equal and round Pulm: effort normal ASN:KNLZJ, soft, no visible cellulitis, wound vac in place  Lab Results:  No results for input(s): WBC, HGB, HCT, PLT in the last 72 hours. BMET No results for input(s): NA, K, CL, CO2, GLUCOSE, BUN, CREATININE, CALCIUM in the last 72 hours. PT/INR No results for input(s): LABPROT, INR in the last 72 hours. CMP     Component Value Date/Time   NA 138 01/30/2018 0741   K 3.7 01/30/2018 0741   CL 103 01/30/2018 0741   CO2 28 01/30/2018 0741   GLUCOSE 117 (H) 01/30/2018 0741   BUN 17 01/30/2018 0741   CREATININE 1.03 01/30/2018 0741   CALCIUM 8.6 (L) 01/30/2018 0741   PROT 6.3 (L) 01/27/2018 0616   ALBUMIN 2.4 (L) 01/27/2018 0616   AST 35 01/27/2018 0616   ALT 14 (L) 01/27/2018 0616   ALKPHOS 63 01/27/2018 0616   BILITOT 1.2 01/27/2018 0616   GFRNONAA >60 01/30/2018 0741   GFRAA >60 01/30/2018 0741   Lipase     Component Value Date/Time   LIPASE 19 12/23/2016 0818       Studies/Results: No results found.  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 Georgette Dover - incision healing well R abdominal wall soft tissue mass - s/p panniculectomy 01/19/18 Dr. Georgette Dover - POD#14 - Continue superficial Provena-like NPWT, this has been approved by insurance for home use. Keep area under pannus dry with towel or interdry. Patient will follow up with Dr. Georgette Dover on Monday.  FEN: reg diet VTE: SCD, lovenox 95 mg Q24h QB:HALP currently.Ancef 3/12, 3/13, 3/15-16;3/20 Follow up: Dr. Georgette Dover next week   LOS: 7 days    Wellington Hampshire , [facility] 02/02/2018, 8:41 AM Pager: [phone] Consults: [phone] Mon-Fri 7:00 am-4:30 pm Sat-Sun 7:00 am-11:30 am

## 2018-02-02 NOTE — Progress Notes (Signed)
Occupational Therapy Session Note  Patient Details  Name: Tyler Walters MRN: [id number] Date of Birth: 04-24-1966  Today's Date: 02/02/2018 OT Individual Time: 1300-1400 OT Individual Time Calculation (min): 60 min    Short Term Goals: Week 1:  OT Short Term Goal 1 (Week 1): STGs = LTGs  Skilled Therapeutic Interventions/Progress Updates:    Pt seen for OT session focusing on functional mobility and activity tolerance. Pt sitting up in recliner upon arrival, agreeable to tx session and denying pain. He ambulated thorughout session with RW and supervision, assist for management of wound vac. Pt able to self propel w/c off unit with supervision to entrance of hospital demonstrating great functional activity tolerance/endurance. Outside, pt completed functional ambulation over uneven surfaces and rough terrain. Seated rest break break provided on park bench, pt independently intiating to sit next to arm rest sit assist with sit <> stand and able to stand from standard bench with guarding assist. Pt propelled 3/4 way back to unit at end of session, taken remainder of way total A. Pt left seated in w/c at end of session, all needs in reach.  Throughout session, education provided regarding continuum of care, energy conservation, and d/c planning.   Therapy Documentation Precautions:  Precautions Precautions: Fall Precaution Comments: wound vac on abdomen Required Braces or Orthoses: Other Brace/Splint Other Brace/Splint: L Bledsoe brace on during upright mobility; able to have off when in bed or chair. no ROM restrictions.  Restrictions Weight Bearing Restrictions: Yes LLE Weight Bearing: Non weight bearing  See Function Navigator for Current Functional Status.   Therapy/Group: Individual Therapy  Jeancarlo Leffler L 02/02/2018, 7:03 AM

## 2018-02-02 NOTE — Plan of Care (Signed)
  Problem: RH SKIN INTEGRITY Goal: RH STG ABLE TO PERFORM INCISION/WOUND CARE W/ASSISTANCE Description STG Able To Perform Incision/Wound Care With mod Assistance.  Currently has a wound VAC, will be max assist after VAC removed. Outcome: Not Progressing

## 2018-02-02 NOTE — Progress Notes (Signed)
Physical Therapy Session Note  Patient Details  Name: Tyler Walters MRN: [id number] Date of Birth: Jan 19, 1966  Today's Date: 02/02/2018 PT Individual Time: 8295-6213 PT Individual Time Calculation (min): 10 min   Short Term Goals: Week 1:  PT Short Term Goal 1 (Week 1): =LTGs due to ELOS  Skilled Therapeutic Interventions/Progress Updates:  Pt received in w/c in room reporting significant fatigue but agreeable to tx. C/o cramping & RN made aware - meds administered during session. Discussed car transfer & pt reports he has already done this & feels comfortable with it. Pt requesting to perform therapeutic exercises 2/2 fatigue. Pt propels himself towards gym with BUE & mod I, stopping at nurses station to take medicine. Pt then receives phone call from disability agency and has to speak with them. Returned pt to room total A for time management. Pt left sitting in w/c with all needs within reach.   Therapy Documentation Precautions:  Precautions Precautions: Fall Precaution Comments: wound vac on abdomen Required Braces or Orthoses: Other Brace/Splint Other Brace/Splint: L Bledsoe brace on during upright mobility; able to have off when in bed or chair. no ROM restrictions.  Restrictions Weight Bearing Restrictions: Yes LLE Weight Bearing: Non weight bearing   General: PT Amount of Missed Time (min): 35 Minutes PT Missed Treatment Reason: (pt on phone with disability)   See Function Navigator for Current Functional Status.   Therapy/Group: Individual Therapy  Waunita Schooner 02/02/2018, 2:46 PM

## 2018-02-02 NOTE — Progress Notes (Signed)
Occupational Therapy Session Note  Patient Details  Name: Tyler Walters MRN: [id number] Date of Birth: 11-Sep-1966  Today's Date: 02/02/2018 OT Individual Time: 1100-1200 OT Individual Time Calculation (min): 60 min    Short Term Goals: Week 1:  OT Short Term Goal 1 (Week 1): STGs = LTGs  Skilled Therapeutic Interventions/Progress Updates:    Pt resting in w/c upon arrival.  Pt stated he and his wife completed bathing/dressing tasks prior to therapy.  Per pt report, pt required min A with donning socks but completed all other tasks without assistance.  Pt requested to use toilet and amb with RW to BR to use toilet.  Pt completed toilet transfer and toileting tasks at mod I level.  Pt transitioned to table tasks while standing with focus on standing balance and adhering to NWB status of LLE.  Pt engaged in BUE therex on [employer] (Random level 5, 9 min and 5 min). Pt returned to room and amb with RW to bariatric chair.  Pt remained in chair with all needs within reach. Pt pleased with progress and ready for discharge tomorrow.   Therapy Documentation Precautions:  Precautions Precautions: Fall Precaution Comments: wound vac on abdomen Required Braces or Orthoses: Other Brace/Splint Other Brace/Splint: L Bledsoe brace on during upright mobility; able to have off when in bed or chair. no ROM restrictions.  Restrictions Weight Bearing Restrictions: Yes LLE Weight Bearing: Non weight bearing   Pain: Pain Assessment Pain Score: 4  Pain Type: Surgical pain Pain Location: Abdomen Pain Orientation: Left;Right;Mid   See Function Navigator for Current Functional Status.   Therapy/Group: Individual Therapy  Leroy Libman 02/02/2018, 1:21 PM

## 2018-02-02 NOTE — Progress Notes (Signed)
[facility]IC[facility] RE[facility]ABILITATION     PROGRESS NOTE    Subjective/Complaints: No new issues overnight. Pain controlled.   ROS: Patient denies fever, rash, sore throat, blurred vision, nausea, vomiting, diarrhea, cough, shortness of breath or chest pain, joint or back pain, headache, or mood change.   Objective: Vital Signs: Blood pressure 137/89, pulse 98, temperature 98 F (36.7 C), temperature source Oral, resp. rate 17, height 5\' 8"  (1.727 m), weight (!) 193 kg (425 lb 7.8 oz), SpO2 99 %. No results found. No results for input(s): WBC, HGB, HCT, PLT in the last 72 hours. No results for input(s): NA, K, CL, GLUCOSE, BUN, CREATININE, CALCIUM in the last 72 hours.  Invalid input(s): CO CBG (last 3)  No results for input(s): GLUCAP in the last 72 hours.  Wt Readings from Last 3 Encounters:  01/31/18 (!) 193 kg (425 lb 7.8 oz)  01/24/18 (!) 147.9 kg (326 lb)    Physical Exam:  Constitutional: No distress . Vital signs reviewed. HEENT: EOMI, oral membranes moist Cardiovascular: RRR without murmur. No JVD    Respiratory: CTA Bilaterally without wheezes or rales. Normal effort    GI: BS +, non-tender, non-distended, abd wound with vac, serosang drainage in cannister Musculoskeletal: He exhibits1+ to 2+ LLE edema.  LLE limited by pain and brace.   Neurological: He isalertand oriented to person, place, and time. Nocranial nerve deficit. Speech clear. Able to follow commands without difficulty. Moves BUE/RLE 4-5/5.  LLE limited by brace. Skin: Skin iswarmand dry in limbs. Left knee incision cdi.Marland Kitchen  Psychiatric: He has anormal mood and affect. Hisbehavior is normal.Judgmentand thought contentnormal.     Assessment/Plan: 1. Functional deficits secondary to left tibial plateau fx, morbid obesity which require 3+ hours per day of interdisciplinary therapy in a comprehensive inpatient rehab setting. Physiatrist is providing close team supervision and 24 hour  management of active medical problems listed below. Physiatrist and rehab team continue to assess barriers to discharge/monitor patient progress toward functional and medical goals.  Function:  Bathing Bathing position   Position: Wheelchair/chair at sink  Bathing parts Body parts bathed by patient: Right arm, Left arm, Chest, Abdomen, Front perineal area, Right upper leg, Left upper leg, Buttocks Body parts bathed by helper: Buttocks  Bathing assist Assist Level: Touching or steadying assistance(Pt > 75%)      Upper Body Dressing/Undressing Upper body dressing   What is the patient wearing?: Pull over shirt/dress     Pull over shirt/dress - Perfomed by patient: Thread/unthread right sleeve, Thread/unthread left sleeve, Put head through opening, Pull shirt over trunk          Upper body assist Assist Level: Set up      Lower Body Dressing/Undressing Lower body dressing   What is the patient wearing?: Pants, Non-skid slipper socks     Pants- Performed by patient: Thread/unthread right pants leg, Thread/unthread left pants leg, Pull pants up/down Pants- Performed by helper: Thread/unthread right pants leg, Thread/unthread left pants leg   Non-skid slipper socks- Performed by helper: Don/doff right sock, Don/doff left sock                  Lower body assist Assist for lower body dressing: Touching or steadying assistance (Pt > 75%)      Toileting Toileting Toileting activity did not occur: No continent bowel/bladder event(using urinal, No BM) Toileting steps completed by patient: Adjust clothing prior to toileting, Performs perineal hygiene, Adjust clothing after toileting Toileting steps completed by helper:  Performs perineal hygiene Toileting Assistive Devices: Grab bar or rail  Toileting assist Assist level: Touching or steadying assistance (Pt.75%)   Transfers Chair/bed transfer   Chair/bed transfer method: Ambulatory, Stand pivot Chair/bed transfer assist  level: Supervision or verbal cues Chair/bed transfer assistive device: Armrests, Walker, Orthosis     Locomotion Ambulation     Max distance: 50' Assist level: Supervision or verbal cues   Wheelchair   Type: Manual Max wheelchair distance: 200' Assist Level: No help, No cues, assistive device, takes more than reasonable amount of time  Cognition Comprehension Comprehension assist level: Follows complex conversation/direction with no assist  Expression Expression assist level: Expresses complex ideas: With no assist  Social Interaction Social Interaction assist level: Interacts appropriately with others - No medications needed.  Problem Solving Problem solving assist level: Solves complex problems: Recognizes & self-corrects  Memory Memory assist level: Complete Independence: No helper   Medical Problem List and Plan: 1.Functional deficitssecondary totibial plateau fracture s/p ORIF and panniculectomy. NWB LLE with Bledsoe brace. Lock out brace when in bed.    -continue therapies   -Continue to work towards discharge 02/03/18. Finalize dc planning  2. DVT Prophylaxis/Anticoagulation: Pharmaceutical:Lovenox for one month duration from surgery 3. Pain Management: . Oxycodone and robaxin prn. 4. Mood:LCSW to follow for evaluation and support. 5. Neuropsych: This patientiscapable of making decisions onhisown behalf. 6. Skin/Wound Care:wound with some drainage. A few staples taken out   -pt to go home with vac and f/u with gen surgery  -left tibial wound clean with sutures--follow up with ortho for removal  7. Fluids/Electrolytes/Nutrition:good po  8. ABLA: Hemoglobin 9.3. 9. Leucocytosis: Likely reactive. Monitor for signs of infection/ down to 11.3 on 01/30/2018   --afebrile, wounds clean   10. Obesity: Encourage weight loss and life style changes to help promote mobility and overall health.  11. Constipation:  .    -moving bowels more regularly.    LOS (Days)  [place] EVALUATION WAS PERFORMED  Meredith Staggers, MD 02/02/2018 8:01 AM

## 2018-02-02 NOTE — Progress Notes (Signed)
Physical Therapy Discharge Summary  Patient Details  Name: Tyler Walters MRN: [id number] Date of Birth: 1966/09/17  Today's Date: 02/02/2018 PT Individual Time:1015-1100   45 min    Patient has met 5 of 5 long term goals due to improved activity tolerance, improved balance, increased strength, decreased pain and ability to compensate for deficits.  Patient to discharge at an ambulatory level Modified Independent.   Patient's care partner is independent to provide the necessary physical assistance at discharge.  Reasons goals not met: All PT goals met.   Recommendation:  Patient will benefit from ongoing skilled PT services in home health setting to continue to advance safe functional mobility, address ongoing impairments in strength, safety, transfers, gait, pain mangement and minimize fall risk.  Equipment: bariatric RW and WC.   Reasons for discharge: treatment goals met and discharge from hospital  Patient/family agrees with progress made and goals achieved: Yes   PT Treatment Pt received sitting in recliner and agreeable to PT. Ambulatory transfer to [facility] with out assist from PT use UE support on RW.  PT instructed pt in Grad day assessment to measure progress toward goals. See below for details. Patient returned to room and left sitting in [facility] with call bell in reach and all needs met.      PT Discharge Precautions/Restrictions Precautions Precaution Comments: wound vac on abdomen Other Brace/Splint: L Bledsoe brace on during upright mobility; able to have off when in bed or chair. no ROM restrictions.  Restrictions Weight Bearing Restrictions: Yes LLE Weight Bearing: Non weight bearing Vital Signs   Pain Pain Assessment Pain Score: 4  Pain Type: Surgical pain Pain Location: Abdomen Pain Orientation: Left;Right;Mid Vision/Perception    WFL Cognition Orientation Level: Oriented X4 Memory: Appears intact Awareness: Appears intact Problem Solving: Appears  intact Safety/Judgment: Appears intact Sensation Sensation Light Touch: Appears Intact Stereognosis: Appears Intact Hot/Cold: Appears Intact Proprioception: Appears Intact Coordination Gross Motor Movements are Fluid and Coordinated: Yes Fine Motor Movements are Fluid and Coordinated: Yes Motor  Motor Motor: Within Functional Limits  Mobility   Bed Mobility: declined by Pt due to pt report of sleeping in recliner upon d/c  Transfers Transfers: Yes Sit to Stand: 6: Modified independent (Device/Increase time)(with RW) Stand to Sit: 6: Modified independent (Device/Increase time) Stand Pivot Transfers: 6: Modified independent (Device/Increase time)(with RW)  Locomotion  Ambulation Ambulation: Yes Ambulation/Gait Assistance: 6: Modified independent (Device/Increase time) Ambulation Distance (Feet): 40 Feet Assistive device: Rolling walker Gait Gait: Yes Gait Pattern: Impaired Gait Pattern: Step-to pattern Stairs / Additional Locomotion Stairs: No Wheelchair Mobility Wheelchair Mobility: Yes Wheelchair Assistance: 6: Modified independent (Device/Increase time) Wheelchair Propulsion: Both upper extremities;Right lower extremity Wheelchair Parts Management: Needs assistance  Trunk/Postural Assessment  Cervical Assessment Cervical Assessment: Within Functional Limits Thoracic Assessment Thoracic Assessment: Within Functional Limits Lumbar Assessment Lumbar Assessment: Within Functional Limits Postural Control Postural Control: Within Functional Limits  Balance Static Sitting Balance Static Sitting - Balance Support: No upper extremity supported Static Sitting - Level of Assistance: 6: Modified independent (Device/Increase time) Dynamic Sitting Balance Dynamic Sitting - Balance Support: No upper extremity supported Dynamic Sitting - Level of Assistance: 6: Modified independent (Device/Increase time) Static Standing Balance Static Standing - Balance Support: Right upper  extremity supported Static Standing - Level of Assistance: 6: Modified independent (Device/Increase time) Dynamic Standing Balance Dynamic Standing - Balance Support: Right upper extremity supported Dynamic Standing - Level of Assistance: 6: Modified independent (Device/Increase time) Extremity Assessment      RLE Assessment RLE Assessment:  Within Functional Limits LLE Assessment LLE Assessment: Exceptions to [facility] LLE AROM (degrees) Overall AROM Left Lower Extremity: Due to precautions(KI in standing. ) LLE Strength LLE Overall Strength: Deficits LLE Overall Strength Comments: 2/5  at hip due to to pain. KI limiting knee assessement.    See Function Navigator for Current Functional Status.  Lorie Phenix 02/02/2018, 11:00 AM

## 2018-02-03 DIAGNOSIS — S82202A Unspecified fracture of shaft of left tibia, initial encounter for closed fracture: Secondary | ICD-10-CM

## 2018-02-03 NOTE — Progress Notes (Signed)
[place] PHYSICAL MEDICINE & REHABILITATION     PROGRESS NOTE    Subjective/Complaints: Patient anxious to go home.  Lovenox prescription taken to pharmacy that particular pharmacy does not have until Monday  his wife will check another pharmacy.  Also his coupon is not covering oxycodone, family will pay privately  ROS: Patient denies fever, rash, sore throat, blurred vision, nausea, vomiting, diarrhea, cough, shortness of breath or chest pain, joint or back pain, headache, or mood change.   Objective: Vital Signs: Blood pressure (!) 145/93, pulse 99, temperature 98.5 F (36.9 C), temperature source Oral, resp. rate 18, height 5\' 8"  (1.727 m), weight (!) 193 kg (425 lb 7.8 oz), SpO2 98 %. No results found. No results for input(s): WBC, HGB, HCT, PLT in the last 72 hours. No results for input(s): NA, K, CL, GLUCOSE, BUN, CREATININE, CALCIUM in the last 72 hours.  Invalid input(s): CO CBG (last 3)  No results for input(s): GLUCAP in the last 72 hours.  Wt Readings from Last 3 Encounters:  01/31/18 (!) 193 kg (425 lb 7.8 oz)  01/24/18 (!) 147.9 kg (326 lb)    Physical Exam:  Constitutional: No distress . Vital signs reviewed. HEENT: EOMI, oral membranes moist Cardiovascular: RRR without murmur. No JVD    Respiratory: CTA Bilaterally without wheezes or rales. Normal effort    GI: BS +, non-tender, non-distended, abd wound with vac, serosang drainage in cannister Musculoskeletal: He exhibits1+ to 2+ LLE edema.  LLE limited by pain and brace.   Neurological: He isalertand oriented to person, place, and time. Nocranial nerve deficit. Speech clear. Able to follow commands without difficulty. Moves BUE/RLE 4-5/5.  LLE limited by brace. Skin: Skin iswarmand dry in limbs. Left knee incision cdi.Marland Kitchen  Psychiatric: He has anormal mood and affect. Hisbehavior is normal.Judgmentand thought contentnormal.     Assessment/Plan: 1. Functional deficits secondary to left tibial  plateau fx, morbid obesity Stable for D/C today F/u PCP in 3-4 weeks Follow-up surgery 2 days F/u PM&R 2 weeks See D/C summary See D/C instructions Function:  Bathing Bathing position   Position: Wheelchair/chair at sink  Bathing parts Body parts bathed by patient: Right arm, Left arm, Chest, Abdomen, Front perineal area, Right upper leg, Left upper leg, Buttocks Body parts bathed by helper: Buttocks  Bathing assist Assist Level: Supervision or verbal cues      Upper Body Dressing/Undressing Upper body dressing   What is the patient wearing?: Pull over shirt/dress     Pull over shirt/dress - Perfomed by patient: Thread/unthread right sleeve, Thread/unthread left sleeve, Put head through opening, Pull shirt over trunk          Upper body assist Assist Level: No help, No cues      Lower Body Dressing/Undressing Lower body dressing   What is the patient wearing?: Pants, Non-skid slipper socks     Pants- Performed by patient: Thread/unthread right pants leg, Thread/unthread left pants leg, Pull pants up/down Pants- Performed by helper: Thread/unthread right pants leg, Thread/unthread left pants leg   Non-skid slipper socks- Performed by helper: Don/doff right sock, Don/doff left sock                  Lower body assist Assist for lower body dressing: Touching or steadying assistance (Pt > 75%)      Toileting Toileting Toileting activity did not occur: No continent bowel/bladder event(using urinal, No BM) Toileting steps completed by patient: Adjust clothing prior to toileting, Performs perineal hygiene, Adjust clothing after  toileting Toileting steps completed by helper: Performs perineal hygiene Toileting Assistive Devices: Grab bar or rail  Toileting assist Assist level: More than reasonable time   Transfers Chair/bed transfer   Chair/bed transfer method: Stand pivot, Ambulatory Chair/bed transfer assist level: No Help, no cues, assistive device, takes more  than a reasonable amount of time Chair/bed transfer assistive device: Armrests, Medical sales representative     Max distance: 27ft  Assist level: No help, No cues, assistive device, takes more than a reasonable amount of time   Wheelchair   Type: Manual Max wheelchair distance: 167ft  Assist Level: No help, No cues, assistive device, takes more than reasonable amount of time  Cognition Comprehension Comprehension assist level: Follows complex conversation/direction with no assist  Expression Expression assist level: Expresses complex ideas: With no assist  Social Interaction Social Interaction assist level: Interacts appropriately with others - No medications needed.  Problem Solving Problem solving assist level: Solves complex problems: Recognizes & self-corrects  Memory Memory assist level: Complete Independence: No helper   Medical Problem List and Plan: 1.Functional deficitssecondary totibial plateau fracture s/p ORIF and panniculectomy. NWB LLE with Bledsoe brace. Lock out brace when in bed.    -Stable for discharge, follow-up with general surgery on Monday 2. DVT Prophylaxis/Anticoagulation: Pharmaceutical:Lovenox for one month duration from surgery 3. Pain Management: . Oxycodone and robaxin prn. 4. Mood:LCSW to follow for evaluation and support. 5. Neuropsych: This patientiscapable of making decisions onhisown behalf. 6. Skin/Wound Care:wound with some drainage. A few staples taken out   -pt to go home with vac and f/u with gen surgery  -left tibial wound clean with sutures--follow up with ortho for removal  7. Fluids/Electrolytes/Nutrition:good po  8. ABLA: Hemoglobin 9.3. 9. Leucocytosis: Likely reactive. Monitor for signs of infection/ down to 11.3 on 01/30/2018   --afebrile, wounds clean   10. Obesity: Encourage weight loss and life style changes to help promote mobility and overall health.  Discussed increasing walking once his left lower  extremity is healed. 11. Constipation:  .    -moving bowels more regularly.    LOS (Days) [place] EVALUATION WAS PERFORMED  Charlett Blake, MD 02/03/2018 12:00 PM

## 2018-02-04 DIAGNOSIS — R600 Localized edema: Secondary | ICD-10-CM

## 2018-02-04 DIAGNOSIS — L24A9 Irritant contact dermatitis due friction or contact with other specified body fluids: Secondary | ICD-10-CM

## 2018-02-04 DIAGNOSIS — T148XXA Other injury of unspecified body region, initial encounter: Secondary | ICD-10-CM

## 2018-02-05 ENCOUNTER — Telehealth (HOSPITAL_COMMUNITY): Payer: Self-pay | Admitting: Physical Medicine and Rehabilitation

## 2018-02-05 ENCOUNTER — Telehealth: Payer: Self-pay | Admitting: *Deleted

## 2018-02-05 NOTE — Telephone Encounter (Signed)
Patient reports that Lovenox available at pharmacy but he needs an over ride. Informed him that I've left a message for LCSW. Advised him to use ASA 325 mg bid for DVT prophylaxis while awaiting authorization for Lovenox. Marland Kitchen

## 2018-02-05 NOTE — Telephone Encounter (Signed)
Pharmacist from [facility] called and says that the coupon provided for Lovenox is not working and needs advice about getting medication for patient. Please call the pharmacist @ [phone]

## 2018-02-06 ENCOUNTER — Telehealth: Payer: Self-pay | Admitting: Registered Nurse

## 2018-02-06 MED FILL — ENOXAPARIN 100 MG/ML SYR: 100 | 30 days supply | Qty: 30 | Fill #0

## 2018-02-06 NOTE — Telephone Encounter (Signed)
Transitional Care call Transitional Care Call Completed, Appointment Confirmed, New Patient Packet Mailed Patient name: Tyler Walters: 08/13/1966 1. Are you/is patient experiencing any problems since coming home? Yes, with Lovenox. Social Worker Administrator, Civil Service is working on [employer] and will call [name] he verbalizes understanding.  2. Also reports he seen Dr. Georgette Dover yesterday and was instructed no wound vac, just wet to dry dressing. This provider called Dr. Georgette Dover office and his note was read regarding the above. I asked for fax copy.  a. Are there any questions regarding any aspect of care? See the above 3. Are there any questions regarding medications administration/dosing? See above Regarding Lovenox. a. Are meds being taken as prescribed? Yes, except Lovenox. SW will call [name] when Lovenox will be ready. b. "Patient should review meds with caller to confirm" Medication List Reviewed. 4. Have there been any falls? No Falls 5. Has Home Health been to the house and/or have they contacted you? Yes, [facility] a. If not, have you tried to contact them? NA b. Can we help you contact them? NA 6. Are bowels and bladder emptying properly? Yes a. Are there any unexpected incontinence issues? No b. If applicable, is patient following bowel/bladder programs? NA 7. Any fevers, problems with breathing, unexpected pain? No 8. Are there any skin problems or new areas of breakdown? No 9. Has the patient/family member arranged specialty MD follow up (ie cardiology/neurology/renal/surgical/etc.)?  Yes a. Can we help arrange? NA 10. Does the patient need any other services or support that we can help arrange? No 11. Are caregivers following through as expected in assisting the patient? Self-Care 12. Has the patient quit smoking, drinking alcohol, or using drugs as recommended? [name] denies smoking, drinking alcohol or using illicit drugs.  Appointment date/time 02/13/2018,  arrival time 12:40 for 1:00 appointment, with Danella Sensing ANP-C at [place]

## 2018-02-07 NOTE — Progress Notes (Signed)
Social Work Patient ID: Tyler Walters, male   DOB: 11-22-1965, 52 y.o.   MRN: [id number]   CSW was alerted on 02-06-18 that pt was not able to get his Lovenox at [facility] when he received his other medications under the [facility] program.  CSW called and spoke with Ellan Lambert, RN CM, on acute and she instructed CSW to have pt's prescription transferred to [facility] and that it would likely go through the [facility] program there.  CSW called and spoke with Velta Addison at the pharmacy and she was able to have the prescription transferred from [facility] and they had it in stock. CSW faxed pt's [facility] card to [place].  Maryann called the pt once everything went through the system and pt/family came to pick up the Lovenox at 4:15PM on 02-06-18.  CSW alerted Reesa Chew, PA, and Danella Sensing, ANP for Dr. Naaman Plummer of the above.  Pt is now set up at [facility] for further medication assistance.  CSW remains available, as needed.

## 2018-02-09 NOTE — Telephone Encounter (Signed)
Pam Love notified and she has contacted SW to assist.

## 2018-02-13 ENCOUNTER — Encounter: Payer: Self-pay | Attending: Registered Nurse | Admitting: Registered Nurse

## 2018-02-13 ENCOUNTER — Encounter: Payer: Self-pay | Admitting: Registered Nurse

## 2018-02-13 VITALS — BP 161/99 | HR 95 | Resp 14 | Ht 68.0 in | Wt 325.0 lb

## 2018-02-13 DIAGNOSIS — R42 Dizziness and giddiness: Secondary | ICD-10-CM | POA: Insufficient documentation

## 2018-02-13 DIAGNOSIS — Z833 Family history of diabetes mellitus: Secondary | ICD-10-CM | POA: Insufficient documentation

## 2018-02-13 DIAGNOSIS — R600 Localized edema: Secondary | ICD-10-CM

## 2018-02-13 DIAGNOSIS — R200 Anesthesia of skin: Secondary | ICD-10-CM | POA: Insufficient documentation

## 2018-02-13 DIAGNOSIS — S82142A Displaced bicondylar fracture of left tibia, initial encounter for closed fracture: Secondary | ICD-10-CM

## 2018-02-13 DIAGNOSIS — R609 Edema, unspecified: Secondary | ICD-10-CM | POA: Insufficient documentation

## 2018-02-13 DIAGNOSIS — S82142D Displaced bicondylar fracture of left tibia, subsequent encounter for closed fracture with routine healing: Secondary | ICD-10-CM | POA: Insufficient documentation

## 2018-02-13 DIAGNOSIS — Z9889 Other specified postprocedural states: Secondary | ICD-10-CM | POA: Insufficient documentation

## 2018-02-13 DIAGNOSIS — D171 Benign lipomatous neoplasm of skin and subcutaneous tissue of trunk: Secondary | ICD-10-CM | POA: Insufficient documentation

## 2018-02-13 DIAGNOSIS — X58XXXD Exposure to other specified factors, subsequent encounter: Secondary | ICD-10-CM | POA: Insufficient documentation

## 2018-02-13 DIAGNOSIS — R1900 Intra-abdominal and pelvic swelling, mass and lump, unspecified site: Secondary | ICD-10-CM | POA: Insufficient documentation

## 2018-02-13 DIAGNOSIS — Z6841 Body Mass Index (BMI) 40.0 and over, adult: Secondary | ICD-10-CM | POA: Insufficient documentation

## 2018-02-13 DIAGNOSIS — T148XXA Other injury of unspecified body region, initial encounter: Secondary | ICD-10-CM

## 2018-02-13 DIAGNOSIS — L24A9 Irritant contact dermatitis due friction or contact with other specified body fluids: Secondary | ICD-10-CM

## 2018-02-13 NOTE — Progress Notes (Signed)
Subjective:    Patient ID: Tyler Walters, male    DOB: Apr 25, 1966, 52 y.o.   MRN: [id number]  HPI: Tyler Walters is a 52 year old male who is here for transitional care visit in follow up of his closed bicondylar fracture of left tibial plateau, lipoma of abdominal wall ( dressing intact). Abdominal dressing being change twice a day and morbid obesity. He has been home with home health therapies from [place].  He states he has pain in his abdomen, left knee and left leg. He rates his pain 3. He remains non-weight bearing on his left lower extremity. Dr. Doreatha Martin is prescribing his Oxycodone.  Also reports good appetite.   Mother in room.    Pain Inventory Average Pain 3 Pain Right Now 3 My pain is dull and tingling  In the last 24 hours, has pain interfered with the following? General activity 9 Relation with others 7 Enjoyment of life 9 What TIME of day is your pain at its worst? varies Sleep (in general) Poor  Pain is worse with: bending and some activites Pain improves with: heat/ice and medication Relief from Meds: 7  Mobility walk with assistance use a walker ability to climb steps?  no do you drive?  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  no  Physicians involved in your care Any changes since last visit?  no   Family History  Problem Relation Age of Onset  . Diabetes Mother   . Diabetes Father    Social History   Socioeconomic History  . Marital status: Married    Spouse name: Not on file  . Number of children: Not on file  . Years of education: Not on file  . Highest education level: Not on file  Occupational History  . Not on file  Social Needs  . Financial resource strain: Not on file  . Food insecurity:   Worry: Not on file    Inability: Not on file  . Transportation needs:    Medical: Not on file    Non-medical: Not on file  Tobacco Use  . Smoking status: Never Smoker  . Smokeless tobacco: Never Used  Substance and Sexual Activity  . Alcohol use: No  . Drug use: No  . Sexual activity: Not on file  Lifestyle  . Physical activity:    Days per week: Not on file    Minutes per session: Not on file  . Stress: Not on file  Relationships  . Social connections:    Talks on phone: Not on file    Gets together: Not on file    Attends religious service: Not on file    Active member of club or organization: Not on file    Attends meetings of clubs or organizations: Not on file    Relationship status: Not on file  Other Topics Concern  . Not on file  Social History Narrative   Plans discharge home, get some type of health insurance    Past Surgical History:  Procedure Laterality Date  . EXTERNAL FIXATION LEG Left 01/16/2018   Procedure: LEFT KNEE SPANNING EXTERNAL FIXATION LEG;  Surgeon: Leandrew Koyanagi, MD;  Location: [facility];  Service: Orthopedics;  Laterality: Left;  . EXTERNAL FIXATION REMOVAL Left 01/24/2018  Procedure: REMOVAL EXTERNAL FIXATION LEG;  Surgeon: Shona Needles, MD;  Location: [place];  Service: Orthopedics;  Laterality: Left;  both upper and lower leg involved with fixator  . LIPOMA EXCISION Right 01/19/2018   Procedure: EXCISIONOF RIGHT CHEST WALL MASS, EXCISION OF ABDOMINAL MASS;  Surgeon: Donnie Mesa, MD;  Location: [place];  Service: General;  Laterality: Right;  . ORIF TIBIA PLATEAU Left 01/24/2018   Procedure: OPEN REDUCTION INTERNAL FIXATION (ORIF) TIBIAL PLATEAU;  Surgeon: Shona Needles, MD;  Location: [place];  Service: Orthopedics;  Laterality: Left;  . PANNICULECTOMY N/A 01/19/2018   Procedure: PANNICULECTOMY;  Surgeon: Donnie Mesa, MD;  Location: [place];  Service: General;  Laterality: N/A;   Past Medical History:  Diagnosis Date  . Lipoma of abdominal wall     Large lipoma   BP (!) 161/99 (BP Location: Right Arm, Patient Position: Sitting, Cuff Size: Normal)   Pulse 95   Resp 14   Ht 5\' 8"  (1.727 m)   Wt (!) 325 lb (147.4 kg)   SpO2 96%   BMI 49.42 kg/m   Opioid Risk Score:   Fall Risk Score:  `1  Depression screen PHQ 2/9  No flowsheet data found.  Review of Systems  Constitutional: Negative.   HENT: Negative.   Eyes: Negative.   Respiratory: Negative.   Cardiovascular: Positive for leg swelling.  Gastrointestinal: Positive for abdominal pain.  Endocrine: Negative.   Genitourinary: Negative.   Musculoskeletal: Positive for arthralgias and gait problem.  Skin: Negative.   Allergic/Immunologic: Negative.   Neurological: Positive for dizziness and numbness.       Tingling   Hematological: Negative.   Psychiatric/Behavioral: Negative.        Objective:   Physical Exam  Constitutional: He is oriented to person, place, and time. He appears well-developed and well-nourished.  Morbid Obesity  HENT:  Head: Normocephalic and atraumatic.  Neck: Normal range of motion. Neck supple.  Cardiovascular: Normal rate and regular rhythm.  Pulmonary/Chest: Effort normal and breath sounds normal. No respiratory distress.  Abdominal:  Abdominal Dressing Intact   Musculoskeletal: He exhibits edema.  Normal Muscle Bulk and Muscle testing Reveals: Upper Extremities: Full ROM and Muscle Strength 5/5 Lower Extremities: Right: Full ROM and Muscle Strength 5/5 Left: Brace Intact Arrived in wheelchair   Neurological: He is alert and oriented to person, place, and time.  Skin: Skin is warm and dry.  Psychiatric: He has a normal mood and affect.  Nursing note and vitals reviewed.         Assessment & Plan:  1. Closed Bicondylar Fracture of Left Tibial Plateau: Continue with Home Therapies: Dr. Doreatha Martin Following. 2. Lipoma of Abdominal Wall/ Excision of abdominal mass with panniculectomy: Dressing being changed twice  a day and Dr.  Georgette Dover Following.  3. Edema Lower Extremities: Keep Lower Extremities Elevated: PCP Following 4. Morbid Obesity: Continue with Healthy Diet Regimen. Continue with Physical Therapy. Continue to Monitor.  30 Minutes of face to face patient care time was spent during this visit. All questions. were encouraged and answered.  F/U with Dr. Naaman Plummer in 1 month

## 2018-02-14 ENCOUNTER — Other Ambulatory Visit: Payer: Self-pay

## 2018-02-14 ENCOUNTER — Emergency Department (HOSPITAL_BASED_OUTPATIENT_CLINIC_OR_DEPARTMENT_OTHER)
Admit: 2018-02-14 | Discharge: 2018-02-14 | Disposition: A | Discharge status: Home / Self Care | Payer: Self-pay | Attending: Internal Medicine | Admitting: Internal Medicine

## 2018-02-14 ENCOUNTER — Encounter (HOSPITAL_COMMUNITY): Payer: Self-pay

## 2018-02-14 ENCOUNTER — Emergency Department (HOSPITAL_COMMUNITY)
Admission: EM | Admit: 2018-02-14 | Discharge: 2018-02-14 | Disposition: A | Discharge status: Home / Self Care | Payer: Self-pay | Attending: Physician Assistant | Admitting: Physician Assistant

## 2018-02-14 ENCOUNTER — Emergency Department (HOSPITAL_COMMUNITY): Payer: Self-pay

## 2018-02-14 DIAGNOSIS — Z79899 Other long term (current) drug therapy: Secondary | ICD-10-CM | POA: Insufficient documentation

## 2018-02-14 DIAGNOSIS — R609 Edema, unspecified: Secondary | ICD-10-CM

## 2018-02-14 DIAGNOSIS — I10 Essential (primary) hypertension: Secondary | ICD-10-CM | POA: Insufficient documentation

## 2018-02-14 DIAGNOSIS — I272 Pulmonary hypertension, unspecified: Secondary | ICD-10-CM | POA: Insufficient documentation

## 2018-02-14 DIAGNOSIS — M7989 Other specified soft tissue disorders: Secondary | ICD-10-CM

## 2018-02-14 DIAGNOSIS — R0789 Other chest pain: Secondary | ICD-10-CM

## 2018-02-14 DIAGNOSIS — L0390 Cellulitis, unspecified: Secondary | ICD-10-CM | POA: Insufficient documentation

## 2018-02-14 DIAGNOSIS — R600 Localized edema: Secondary | ICD-10-CM | POA: Insufficient documentation

## 2018-02-14 DIAGNOSIS — Z7982 Long term (current) use of aspirin: Secondary | ICD-10-CM | POA: Insufficient documentation

## 2018-02-14 LAB — I-STAT TROPONIN, ED
TROPONIN I, POC: 0 ng/mL (ref 0.00–0.08)
Troponin i, poc: 0 ng/mL (ref 0.00–0.08)

## 2018-02-14 LAB — BASIC METABOLIC PANEL
ANION GAP: 9 (ref 5–15)
BUN: 11 mg/dL (ref 6–20)
CHLORIDE: 103 mmol/L (ref 101–111)
CO2: 25 mmol/L (ref 22–32)
Calcium: 8.7 mg/dL — ABNORMAL LOW (ref 8.9–10.3)
Creatinine, Ser: 1.03 mg/dL (ref 0.61–1.24)
GFR calc Af Amer: >60 mL/min (ref >60)
GFR calc non Af Amer: >60 mL/min (ref >60)
GLUCOSE: 88 mg/dL (ref 65–99)
POTASSIUM: 3.9 mmol/L (ref 3.5–5.1)
Sodium: 137 mmol/L (ref 135–145)

## 2018-02-14 LAB — CBC
HEMATOCRIT: 32.1 % — AB (ref 39.0–52.0)
HEMOGLOBIN: 9.7 g/dL — AB (ref 13.0–17.0)
MCH: 23.1 pg — AB (ref 26.0–34.0)
MCHC: 30.2 g/dL (ref 30.0–36.0)
MCV: 76.4 fL — ABNORMAL LOW (ref 78.0–100.0)
Platelets: 542 10*3/uL — ABNORMAL HIGH (ref 150–400)
RBC: 4.2 MIL/uL — ABNORMAL LOW (ref 4.22–5.81)
RDW: 16.1 % — ABNORMAL HIGH (ref 11.5–15.5)
WBC: 6.3 10*3/uL (ref 4.0–10.5)

## 2018-02-14 LAB — BRAIN NATRIURETIC PEPTIDE: B Natriuretic Peptide: 16.3 pg/mL (ref 0.0–100.0)

## 2018-02-14 LAB — TROPONIN I: Troponin I: <0.03 ng/mL (ref <0.03)

## 2018-02-14 LAB — D-DIMER, QUANTITATIVE: D-Dimer, Quant: 8.41 ug/mL-FEU — ABNORMAL HIGH (ref 0.00–0.50)

## 2018-02-14 MED ORDER — IOPAMIDOL (ISOVUE-370) INJECTION 76%
INTRAVENOUS | Status: AC
  Filled 2018-02-14: qty 100

## 2018-02-14 MED ORDER — DOXYCYCLINE HYCLATE 100 MG PO CAPS: 100.0000 mg | ORAL_CAPSULE | Freq: Two times a day (BID) | ORAL | 0 refills | Status: AC

## 2018-02-14 MED ORDER — IOPAMIDOL (ISOVUE-370) INJECTION 76%
100.0000 mL | Freq: Once | INTRAVENOUS | Status: AC | PRN
  Administered 2018-02-14: 100 mL via INTRAVENOUS

## 2018-02-14 NOTE — ED Triage Notes (Signed)
PT reports central cp x 1 hr that felt like heaviness. PT took 324mg  ASA, 1NTG. PT reports cp was a 2 prior to NTG administration.   AT this time pt still has 2/10 cp with tingling in left arm.  154/102 Hr 96 spo2 97% RA rr 14  Pt was given 4mg  zofran for nausea PTA but iv blew during administration.

## 2018-02-14 NOTE — ED Notes (Signed)
Patient transported to X-ray 

## 2018-02-14 NOTE — ED Provider Notes (Signed)
[facility] DEPARTMENT Provider Note   CSN: [id number] Arrival date & time: 02/14/18  1234     History   Chief Complaint Chief Complaint  Patient presents with  . Chest Pain    HPI Tyler Walters is a 52 y.o. male w/ h/o morbid obesity here for evaluation of gradually worsening central chest discomfort described as pressure that occurred at 11 AM today while sitting on the recliner, pain was constant, non radiating. Associated with tingling sensation to bilateral hands which has resolved. He was given aspirin and nitroglycerin by EMS with resolution of discomfort. Aggravating factors include taking deep breaths which makes the pressure will feel more intense. He has had recent surgeries last month for tibial plateau fracture and large lipoma removal (Dr Erlinda Hong and Dr Prince Solian), and has been more sedentary since surgeries secondary to pain. States he has felt dizzy, nauseated and more short of breath with moving around since the surgeries, think may be due to pain. RLE is swollen and red, states he is on antibiotics for this. Noticed more RLE swelling over the last 5-6 days, usually not swollen.   No cardiac h/o, HLD, HTN, DM, PE/DVT. On lovenox IM.  HPI  Past Medical History:  Diagnosis Date  . Lipoma of abdominal wall    Large lipoma    Patient Active Problem List   Diagnosis Date Noted  . Drainage from wound--abdominal site 02/04/2018  . Leg edema, left 02/04/2018  . Fracture of tibial shaft, left, closed 02/03/2018  . Closed fracture of left tibial plateau 01/26/2018  . Pannus, abdominal 01/19/2018  . Lipoma of abdominal wall   . Closed bicondylar fracture of left tibial plateau 01/16/2018  . Morbid obesity ([place]) 06/25/2009  . CELLULITIS AND ABSCESS OF TRUNK 06/25/2009    Past Surgical History:  Procedure Laterality Date  . EXTERNAL FIXATION LEG Left 01/16/2018   Procedure: LEFT KNEE SPANNING EXTERNAL FIXATION LEG;  Surgeon: Leandrew Koyanagi, MD;   Location: [facility];  Service: Orthopedics;  Laterality: Left;  . EXTERNAL FIXATION REMOVAL Left 01/24/2018   Procedure: REMOVAL EXTERNAL FIXATION LEG;  Surgeon: Shona Needles, MD;  Location: [facility];  Service: Orthopedics;  Laterality: Left;  both upper and lower leg involved with fixator  . LIPOMA EXCISION Right 01/19/2018   Procedure: EXCISIONOF RIGHT CHEST WALL MASS, EXCISION OF ABDOMINAL MASS;  Surgeon: Donnie Mesa, MD;  Location: [facility];  Service: General;  Laterality: Right;  . ORIF TIBIA PLATEAU Left 01/24/2018   Procedure: OPEN REDUCTION INTERNAL FIXATION (ORIF) TIBIAL PLATEAU;  Surgeon: Shona Needles, MD;  Location: [facility];  Service: Orthopedics;  Laterality: Left;  . PANNICULECTOMY N/A 01/19/2018   Procedure: PANNICULECTOMY;  Surgeon: Donnie Mesa, MD;  Location: [facility];  Service: General;  Laterality: N/A;        Home Medications    Prior to Admission medications   Medication Sig Start Date End Date Taking? Authorizing Provider  acetaminophen (TYLENOL) 325 MG tablet Take 1-2 tablets (325-650 mg total) by mouth every 4 (four) hours as needed for mild pain. 02/02/18  Yes Love, Ivan Anchors, PA-C  aspirin 325 MG tablet Take 650 mg by mouth once.   Yes [provider]  cephALEXin (KEFLEX) 500 MG capsule Take 500 mg by mouth 3 (three) times daily. 02/05/18  Yes [provider]  enoxaparin (LOVENOX) 100 MG/ML injection Inject 0.9 mLs (90 mg total) into the skin daily. 02/03/18  Yes Love, Ivan Anchors, PA-C  methocarbamol (ROBAXIN) 750  MG tablet Take 1 tablet (750 mg total) by mouth every 6 (six) hours as needed for muscle spasms. 02/02/18  Yes Love, Ivan Anchors, PA-C  oxyCODONE (OXY IR/ROXICODONE) 5 MG immediate release tablet Take 1-2 tablets (5-10 mg total) by mouth every 6 (six) hours as needed for severe pain. 02/02/18  Yes Love, Ivan Anchors, PA-C  polyethylene glycol (MIRALAX / GLYCOLAX) packet Take 17 g by mouth daily.   Yes [provider]  senna-docusate (SENOKOT-S) 8.6-50  MG tablet Take 2 tablets by mouth daily.   Yes [provider]  tamsulosin (FLOMAX) 0.4 MG CAPS capsule Take 1 capsule (0.4 mg total) by mouth daily after supper. 02/02/18  Yes Love, Ivan Anchors, PA-C  oxyCODONE-acetaminophen (PERCOCET/ROXICET) 5-325 MG tablet Take 1 tablet by mouth every 6 (six) hours as needed. 02/12/18   [provider]    Family History Family History  Problem Relation Age of Onset  . Diabetes Mother   . Diabetes Father     Social History Social History   Tobacco Use  . Smoking status: Never Smoker  . Smokeless tobacco: Never Used  Substance Use Topics  . Alcohol use: No  . Drug use: No     Allergies   Morphine and related and Tape   Review of Systems Review of Systems  Respiratory: Positive for shortness of breath.   Cardiovascular: Positive for chest pain (pressure, resolved).  Gastrointestinal: Positive for nausea.  All other systems reviewed and are negative.    Physical Exam Updated Vital Signs BP 137/90   Pulse 87   Temp 97.8 F (36.6 C) (Oral)   Resp 19   Ht 5\' 8"  (1.727 m)   Wt (!) 147.4 kg (325 lb)   SpO2 97%   BMI 49.42 kg/m   Physical Exam  Constitutional: He appears well-developed and well-nourished.  NAD. Non toxic. Obese.   HENT:  Head: Normocephalic and atraumatic.  Nose: Nose normal.  Moist mucous membranes. Tonsils and oropharynx normal  Eyes: Conjunctivae, EOM and lids are normal.  Neck: Trachea normal and normal range of motion.  Neck is supple. Trachea midline  Cardiovascular: Normal rate, regular rhythm, S1 normal, S2 normal and normal heart sounds.  Pulses:      Carotid pulses are 2+ on the right side, and 2+ on the left side.      Radial pulses are 2+ on the right side, and 2+ on the left side.       Dorsalis pedis pulses are 2+ on the right side, and 2+ on the left side.  RRR. 2+ pitting LE edema to knees, slightly L > R.   Pulmonary/Chest: Effort normal. No respiratory distress. He has  decreased breath sounds.  Reproducible sternal chest wall tenderness. Decreased BS to lower lobes difficulty exam 2/2 body habitus.   Abdominal: Soft. Bowel sounds are normal. There is no tenderness.  No epigastric tenderness. No distention.   Musculoskeletal:  External LLE brace noted   Neurological: He is alert. GCS eye subscore is 4. GCS verbal subscore is 5. GCS motor subscore is 6.  Skin: Skin is warm and dry. Capillary refill takes less than 2 seconds.  Mild erythema, warmth to anterior left lower extremity, mildly tender. Large surgical incision noted to panus, dressing d/i.   Psychiatric: He has a normal mood and affect. His speech is normal and behavior is normal. Judgment and thought content normal. Cognition and memory are normal.       ED Treatments / Results  Labs (  all labs ordered are listed, but only abnormal results are displayed) Labs Reviewed  BASIC METABOLIC PANEL - Abnormal; Notable for the following components:      Result Value   Calcium 8.7 (*)    All other components within normal limits  CBC - Abnormal; Notable for the following components:   RBC 4.20 (*)    Hemoglobin 9.7 (*)    HCT 32.1 (*)    MCV 76.4 (*)    MCH 23.1 (*)    RDW 16.1 (*)    Platelets 542 (*)    All other components within normal limits  D-DIMER, QUANTITATIVE (NOT AT [facility]) - Abnormal; Notable for the following components:   D-Dimer, Quant 8.41 (*)    All other components within normal limits  BRAIN NATRIURETIC PEPTIDE  I-STAT TROPONIN, ED    EKG EKG Interpretation  Date/Time:  Wednesday February 14 2018 12:45:34 EDT Ventricular Rate:  91 PR Interval:    QRS Duration: 101 QT Interval:  394 QTC Calculation: 485 R Axis:   9 Text Interpretation:  Sinus rhythm Prolonged PR interval Borderline prolonged QT interval rate improved compared to Mar 2019 Confirmed by Sherwood Gambler [phone]) on 02/14/2018 1:12:43 PM   Radiology Dg Chest 2 View  Result Date: 02/14/2018 CLINICAL DATA:   Chest pressure sensation and shortness of breath for the past 5 days. Nonsmoker. Morbid obesity. EXAM: CHEST - 2 VIEW COMPARISON:  Report of a chest x-ray of December 04, 2002 FINDINGS: The lungs are adequately inflated and clear. The cardiac silhouette is enlarged. The central pulmonary vascularity is prominent and there is mild cephalization. There is no pleural effusion. No alveolar edema is observed. The observed bony thorax is unremarkable. IMPRESSION: Enlargement of cardiac silhouette with mild pulmonary vascular congestion suggests low-grade CHF. No alveolar pneumonia nor pleural effusion. Electronically Signed   By: David  Martinique M.D.   On: 02/14/2018 13:12    Procedures Procedures (including critical care time)  Medications Ordered in ED Medications  iopamidol (ISOVUE-370) 76 % injection 100 mL (has no administration in time range)  iopamidol (ISOVUE-370) 76 % injection (has no administration in time range)     Initial Impression / Assessment and Plan / ED Course  I have reviewed the triage vital signs and the nursing notes.  Pertinent labs & imaging results that were available during my care of the patient were reviewed by me and considered in my medical decision making (see chart for details).  Clinical Course as of Feb 15 1543  Wed Feb 14, 2018  1314 Sinus rhythm Prolonged PR interval Borderline prolonged QT interval rate improved compared to Mar 2019 Confirmed by Sherwood Gambler [phone]) on 02/14/2018 1:12:43 PM  ED EKG within 10 minutes [CG]  1450 IMPRESSION: Enlargement of cardiac silhouette with mild pulmonary vascular congestion suggests low-grade CHF. No alveolar pneumonia nor pleural effusion.  DG Chest 2 View [CG]    Clinical Course User Index [CG] Kinnie Feil, PA-C   52 yo here for central chest pressure more intense with deep breaths, non exertional, constant. Recent surgeries in last month. Exam remarkable with bilateral LE edema L>R, left with erythema,  warmth. On lovenox IM. He is morbidly obese, but no known CAD, HTN, HLD, tobacco abuse, PE/DVT. Chest pain described is atypical. Concern for PE/DVT vs CHF vs PNA vs ACS. Continues to have 2/10 central chest pressure since 1100.   1500: Labs reviewed and remarkable for mild stable anemia hgb 9.7, d-dimer 8.41. CXR shows enlargement of cardiac silhouette,  mild vascular congestion w/o PNA or effusion. No h/o CHF. EKG without significant changes since last or ischemia. Will obtain delta trop, bilateral leg [place], CTA for PE. Add BNP given CXR findings.  Final Clinical Impressions(s) / ED Diagnoses   1540: Patient will be handed off to oncoming EDPA [facility] who will f/u on pending labs and imaging to rule out PE, DVT, new onset CHF. If all unremarkable, consider dc with abx covering MRSA and close f/u with PCP. Final diagnoses:  Chest pressure  Bilateral leg edema    ED Discharge Orders    None       Arlean Hopping 02/14/18 1544    Sherwood Gambler, MD 02/14/18 1610

## 2018-02-14 NOTE — Progress Notes (Signed)
Preliminary notes--Bilateral lower extremities venous duplex exam completed. Negative for DVT.    Hongying Hazelynn Mckenny(RDMS RVT) 02/14/18 5:05 PM

## 2018-02-14 NOTE — ED Provider Notes (Signed)
Patient care signed out to me by Carmon Sails, PA-C at shift change.    In brief patient is a 52 y/o male who presented to the ED to be evaluated for central chest pressure that has been constant since 11am.  He also has new LE swelling L>R with redness/warmth.  Pain is worse with deep breaths and began when he was sitting. Pts pain is atypical and he is low risk for ACS. Pt is at increased risk for DVT/PE as he has had 2 recent surgeries and was recently discharged from the hospital following the surgeries. He is currently on lovenox IM. He is also on keflex currently.   Plan is to follow up on CTA, BLE [place], delta trop, and BNP. If all normal, plan is to d/c with MRSA convering abx and have pt f/u closely as an outpatient. She does not feel that pt needs to be admitted for CP rule out as pt is low risk for ACS given no h/o smoking, HTN, HLD, DM. Initial trop negative. No STT wave abnormalities on initial ECG.  HEART score 2 (obesity, age) low risk.  Delta trop negative. Repeat ECG with NSR 88 BPM, no st elevations or depressions suggesting ischemia. BNP negative. CTA without evidence of pulmonary embolism. Did show dilated main pulmonary artery suggesting pulmonary arterial hypertension.  Bilateral lower extremity ultrasounds are without evidence of DVT.  On re-evaluation pt states he is feeling improved.  States that his chest pain has improved and he is denying any shortness of breath.  He is requesting to go home.   Discussed results of his lab work and CT scan showing likely pulmonary arterial hypertension.  Discussed that he will need to follow-up with his orthopedic doctor and surgeon who completed his lipoma removal to inform them of his visit to the ED.  Also advised him to follow-up with primary care in 1 week for reevaluation.  Gave a referral to pulmonary doctor to further evaluate and treat his possible pulmonary arterial hypertension.  Gave Rx for doxycycline to treat suspected lower  extremity cellulitis.  Patient was given strict instructions on the plan for follow-up and given strict return precautions as well.  Patient and his family voiced understanding of the plan and reasons to return immediately to the ER.  All questions were answered and they are comfortable with the plan for discharge.   Rodney Booze, PA-C 02/15/18 1355    Macarthur Critchley, MD 02/17/18 1317

## 2018-02-14 NOTE — Discharge Instructions (Addendum)
You will need to call your orthopedic doctor and let them know about the redness on your leg.  He should also call your abdominal doctor to let them know that we prescribed a new antibiotic doxycycline.  You were given a referral to a pulmonary/lung doctor, Dr. Elwyn Reach, to follow-up on the elevated pulmonary pressures that were found on your CT scan of your chest.  You should also follow-up with your primary care doctor tomorrow as already scheduled to address was found during this visit as well as your elevated blood pressures today.  You should return to the emergency department for any fevers, chest pain, shortness of breath, chest pain that is associated with sweating, nausea, vomiting, shortness of breath, lightheadedness.  You should return to the emergency department for any new or worsening symptoms.  I have prescribed a new medication for you today. It is important that when you pick the prescription up you discuss the potential interactions of this medication with other medications you are taking, including over the counter medications, with the pharmacists.   This new medication has potential side effects. Be sure to contact your primary care provider or return to the emergency department if you are experiencing new symptoms that you are unable to tolerate after starting the medication. You need to receive medical evaluation immediately if you start to experience blistering of the skin, rash, swelling, or difficulty breathing as these signs could indicate a more serious medication side effect.

## 2018-02-14 NOTE — ED Notes (Signed)
Pt in Vascular.

## 2018-02-15 ENCOUNTER — Ambulatory Visit (INDEPENDENT_AMBULATORY_CARE_PROVIDER_SITE_OTHER): Payer: Self-pay | Admitting: Physician Assistant

## 2018-02-15 ENCOUNTER — Encounter (INDEPENDENT_AMBULATORY_CARE_PROVIDER_SITE_OTHER): Payer: Self-pay | Admitting: Physician Assistant

## 2018-02-15 ENCOUNTER — Other Ambulatory Visit: Payer: Self-pay

## 2018-02-15 VITALS — BP 161/88 | HR 94 | Temp 98.1°F

## 2018-02-15 DIAGNOSIS — S82145A Nondisplaced bicondylar fracture of left tibia, initial encounter for closed fracture: Secondary | ICD-10-CM

## 2018-02-15 DIAGNOSIS — I272 Pulmonary hypertension, unspecified: Secondary | ICD-10-CM

## 2018-02-15 DIAGNOSIS — I10 Essential (primary) hypertension: Secondary | ICD-10-CM

## 2018-02-15 DIAGNOSIS — R600 Localized edema: Secondary | ICD-10-CM

## 2018-02-15 MED ORDER — HYDROCHLOROTHIAZIDE 25 MG PO TABS: 25.0000 mg | ORAL_TABLET | Freq: Every day | ORAL | 1 refills | Status: DC

## 2018-02-15 MED FILL — HYDROCHLOROTHIAZIDE 25 MG T: 25 | 30 days supply | Qty: 30 | Fill #0

## 2018-02-15 NOTE — Patient Instructions (Signed)

## 2018-02-15 NOTE — Progress Notes (Signed)
Subjective:  Patient ID: Tyler Walters, male    DOB: April 13, 1966  Age: 52 y.o. MRN: [id number]  CC: hospital f/u  HPI Tyler Walters is a 52 y.o. male with a medical history of morbid obesity, nephrolithiasis, left tib/fib fracture, and lipoma presents as a new patient on hospital f/u.  Had two separate visits to the ED. One on 01/26/18 for closed bicondylar fracture of left tibial plateau which was treated with ORIF. Currently attending PT. No complications thus far.      Second ED visit yesterday and diagnosed with chest pressure, bilateral leg edema, cellulitis, pulmonary hypertension, and HTN. D-dimer was elevated. CTPA interpreted as limited study with no evidence of PE and findings suggestive of pulmonary hypertension. Reportedly had a bilateral LE US doppler which was clear bilaterally.        Outpatient Medications Prior to Visit  Medication Sig Dispense Refill  . cephALEXin (KEFLEX) 500 MG capsule Take 500 mg by mouth 3 (three) times daily.  0  . enoxaparin (LOVENOX) 100 MG/ML injection Inject 0.9 mLs (90 mg total) into the skin daily. 30 Syringe 0  . methocarbamol (ROBAXIN) 750 MG tablet Take 1 tablet (750 mg total) by mouth every 6 (six) hours as needed for muscle spasms. 75 tablet 0  . oxyCODONE (OXY IR/ROXICODONE) 5 MG immediate release tablet Take 1-2 tablets (5-10 mg total) by mouth every 6 (six) hours as needed for severe pain. 52 tablet 0  . polyethylene glycol (MIRALAX / GLYCOLAX) packet Take 17 g by mouth daily.    . tamsulosin (FLOMAX) 0.4 MG CAPS capsule Take 1 capsule (0.4 mg total) by mouth daily after supper. 30 capsule 0  . doxycycline (VIBRAMYCIN) 100 MG capsule Take 1 capsule (100 mg total) by mouth 2 (two) times daily for 10 days. (Patient not taking: Reported on 02/15/2018) 20 capsule 0  . oxyCODONE-acetaminophen (PERCOCET/ROXICET) 5-325 MG tablet Take 1 tablet by mouth every 6 (six) hours as needed.  0  . acetaminophen (TYLENOL) 325 MG tablet Take 1-2 tablets  (325-650 mg total) by mouth every 4 (four) hours as needed for mild pain.    Marland Kitchen aspirin 325 MG tablet Take 650 mg by mouth once.    . senna-docusate (SENOKOT-S) 8.6-50 MG tablet Take 2 tablets by mouth daily.     No facility-administered medications prior to visit.      ROS Review of Systems  Constitutional: Negative for chills, fever and malaise/fatigue.  Eyes: Negative for blurred vision.  Respiratory: Negative for shortness of breath.   Cardiovascular: Negative for chest pain and palpitations.  Gastrointestinal: Negative for abdominal pain and nausea.  Genitourinary: Negative for dysuria and hematuria.  Musculoskeletal: Negative for joint pain and myalgias.  Skin: Negative for rash.  Neurological: Negative for tingling and headaches.  Psychiatric/Behavioral: Negative for depression. The patient is not nervous/anxious.     Objective:  BP (!) 161/88 (BP Location: Right Arm, Patient Position: Sitting, Cuff Size: Large)   Pulse 94   Temp 98.1 F (36.7 C) (Oral)   SpO2 97%   BP/Weight 02/15/2018 02/13/8118 11/11/7827  Systolic BP 562 130 865  Diastolic BP 88 98 99  Wt. (Lbs) - 325 325  BMI - 49.42 49.42      Physical Exam  Constitutional: He is oriented to person, place, and time.  Morbidly obese, NAD, polite. Wearing hinged knee brace on left.  HENT:  Head: Normocephalic and atraumatic.  Eyes: Conjunctivae are normal. No scleral icterus.  Cardiovascular: Normal rate, regular rhythm  and normal heart sounds. Exam reveals no gallop and no friction rub.  No murmur heard. Bilateral lower extremity pitting edema L>R; left 3+ and right 2+  Pulmonary/Chest: Effort normal and breath sounds normal. No stridor. No respiratory distress. He has no wheezes. He has no rales.  Neurological: He is alert and oriented to person, place, and time.  Skin: Skin is warm and dry. No rash noted. No erythema. No pallor.  Psychiatric: He has a normal mood and affect. His behavior is normal. Thought  content normal.  Vitals reviewed.    Assessment & Plan:    1. Hypertension, unspecified type - Begin hydrochlorothiazide (HYDRODIURIL) 25 MG tablet; Take 1 tablet (25 mg total) by mouth daily. Take on tablet in the morning.  Dispense: 90 tablet; Refill: 1  2. Closed nondisplaced bicondylar fracture of left tibia, initial encounter - Advised to continue rehab and to call orthopedic surgeon to make f/u appointment.   3. Pulmonary hypertension (HCC) - hydrochlorothiazide (HYDRODIURIL) 25 MG tablet; Take 1 tablet (25 mg total) by mouth daily. Take on tablet in the morning.  Dispense: 90 tablet; Refill: 1  4. Bilateral lower extremity edema - hydrochlorothiazide (HYDRODIURIL) 25 MG tablet; Take 1 tablet (25 mg total) by mouth daily. Take on tablet in the morning.  Dispense: 90 tablet; Refill: 1    Meds ordered this encounter  Medications  . hydrochlorothiazide (HYDRODIURIL) 25 MG tablet    Sig: Take 1 tablet (25 mg total) by mouth daily. Take on tablet in the morning.    Dispense:  90 tablet    Refill:  1    Order Specific Question:   Supervising Provider    Answer:   Tresa Garter [id number]    Follow-up: Return in about 2 weeks (around 03/01/2018) for HTN and LE edema.   Clent Demark PA

## 2018-03-07 ENCOUNTER — Ambulatory Visit (INDEPENDENT_AMBULATORY_CARE_PROVIDER_SITE_OTHER): Payer: Self-pay | Admitting: Physician Assistant

## 2018-03-07 ENCOUNTER — Other Ambulatory Visit: Payer: Self-pay

## 2018-03-07 ENCOUNTER — Ambulatory Visit: Payer: Self-pay | Admitting: Physical Medicine & Rehabilitation

## 2018-03-07 ENCOUNTER — Encounter (INDEPENDENT_AMBULATORY_CARE_PROVIDER_SITE_OTHER): Payer: Self-pay | Admitting: Physician Assistant

## 2018-03-07 VITALS — BP 138/88 | HR 92 | Temp 97.9°F

## 2018-03-07 DIAGNOSIS — Z1211 Encounter for screening for malignant neoplasm of colon: Secondary | ICD-10-CM

## 2018-03-07 DIAGNOSIS — I10 Essential (primary) hypertension: Secondary | ICD-10-CM

## 2018-03-07 DIAGNOSIS — Z23 Encounter for immunization: Secondary | ICD-10-CM

## 2018-03-07 DIAGNOSIS — R600 Localized edema: Secondary | ICD-10-CM

## 2018-03-07 MED ORDER — FUROSEMIDE 40 MG PO TABS: 40.0000 mg | ORAL_TABLET | Freq: Two times a day (BID) | ORAL | 2 refills | Status: DC

## 2018-03-07 MED FILL — FUROSEMIDE 40 MG TAB: 40 | 30 days supply | Qty: 60 | Fill #0

## 2018-03-07 NOTE — Patient Instructions (Signed)
Managing Your Hypertension Hypertension is commonly called high blood pressure. This is when the force of your blood pressing against the walls of your arteries is too strong. Arteries are blood vessels that carry blood from your heart throughout your body. Hypertension forces the heart to work harder to pump blood, and may cause the arteries to become narrow or stiff. Having untreated or uncontrolled hypertension can cause heart attack, stroke, kidney disease, and other problems. What are blood pressure readings? A blood pressure reading consists of a higher number over a lower number. Ideally, your blood pressure should be below 120/80. The first ("top") number is called the systolic pressure. It is a measure of the pressure in your arteries as your heart beats. The second ("bottom") number is called the diastolic pressure. It is a measure of the pressure in your arteries as the heart relaxes. What does my blood pressure reading mean? Blood pressure is classified into four stages. Based on your blood pressure reading, your health care provider may use the following stages to determine what type of treatment you need, if any. Systolic pressure and diastolic pressure are measured in a unit called mm Hg. Normal  Systolic pressure: below 120.  Diastolic pressure: below 80. Elevated  Systolic pressure: 120-129.  Diastolic pressure: below 80. Hypertension stage 1  Systolic pressure: 130-139.  Diastolic pressure: 80-89. Hypertension stage 2  Systolic pressure: 140 or above.  Diastolic pressure: 90 or above. What health risks are associated with hypertension? Managing your hypertension is an important responsibility. Uncontrolled hypertension can lead to:  A heart attack.  A stroke.  A weakened blood vessel (aneurysm).  Heart failure.  Kidney damage.  Eye damage.  Metabolic syndrome.  Memory and concentration problems.  What changes can I make to manage my  hypertension? Hypertension can be managed by making lifestyle changes and possibly by taking medicines. Your health care provider will help you make a plan to bring your blood pressure within a normal range. Eating and drinking  Eat a diet that is high in fiber and potassium, and low in salt (sodium), added sugar, and fat. An example eating plan is called the DASH (Dietary Approaches to Stop Hypertension) diet. To eat this way: ? Eat plenty of fresh fruits and vegetables. Try to fill half of your plate at each meal with fruits and vegetables. ? Eat whole grains, such as whole wheat pasta, brown rice, or whole grain bread. Fill about one quarter of your plate with whole grains. ? Eat low-fat diary products. ? Avoid fatty cuts of meat, processed or cured meats, and poultry with skin. Fill about one quarter of your plate with lean proteins such as fish, chicken without skin, beans, eggs, and tofu. ? Avoid premade and processed foods. These tend to be higher in sodium, added sugar, and fat.  Reduce your daily sodium intake. Most people with hypertension should eat less than 1,500 mg of sodium a day.  Limit alcohol intake to no more than 1 drink a day for nonpregnant women and 2 drinks a day for men. One drink equals 12 oz of beer, 5 oz of wine, or 1 oz of hard liquor. Lifestyle  Work with your health care provider to maintain a healthy body weight, or to lose weight. Ask what an ideal weight is for you.  Get at least 30 minutes of exercise that causes your heart to beat faster (aerobic exercise) most days of the week. Activities may include walking, swimming, or biking.  Include exercise   to strengthen your muscles (resistance exercise), such as weight lifting, as part of your weekly exercise routine. Try to do these types of exercises for 30 minutes at least 3 days a week.  Do not use any products that contain nicotine or tobacco, such as cigarettes and e-cigarettes. If you need help quitting, ask  your health care provider.  Control any long-term (chronic) conditions you have, such as high cholesterol or diabetes. Monitoring  Monitor your blood pressure at home as told by your health care provider. Your personal target blood pressure may vary depending on your medical conditions, your age, and other factors.  Have your blood pressure checked regularly, as often as told by your health care provider. Working with your health care provider  Review all the medicines you take with your health care provider because there may be side effects or interactions.  Talk with your health care provider about your diet, exercise habits, and other lifestyle factors that may be contributing to hypertension.  Visit your health care provider regularly. Your health care provider can help you create and adjust your plan for managing hypertension. Will I need medicine to control my blood pressure? Your health care provider may prescribe medicine if lifestyle changes are not enough to get your blood pressure under control, and if:  Your systolic blood pressure is 130 or higher.  Your diastolic blood pressure is 80 or higher.  Take medicines only as told by your health care provider. Follow the directions carefully. Blood pressure medicines must be taken as prescribed. The medicine does not work as well when you skip doses. Skipping doses also puts you at risk for problems. Contact a health care provider if:  You think you are having a reaction to medicines you have taken.  You have repeated (recurrent) headaches.  You feel dizzy.  You have swelling in your ankles.  You have trouble with your vision. Get help right away if:  You develop a severe headache or confusion.  You have unusual weakness or numbness, or you feel faint.  You have severe pain in your chest or abdomen.  You vomit repeatedly.  You have trouble breathing. Summary  Hypertension is when the force of blood pumping through  your arteries is too strong. If this condition is not controlled, it may put you at risk for serious complications.  Your personal target blood pressure may vary depending on your medical conditions, your age, and other factors. For most people, a normal blood pressure is less than 120/80.  Hypertension is managed by lifestyle changes, medicines, or both. Lifestyle changes include weight loss, eating a healthy, low-sodium diet, exercising more, and limiting alcohol. This information is not intended to replace advice given to you by your health care provider. Make sure you discuss any questions you have with your health care provider. Document Released: 07/18/2012 Document Revised: 09/21/2016 Document Reviewed: 09/21/2016 Elsevier Interactive Patient Education  2018 Else[employer] Inc[employer]  

## 2018-03-07 NOTE — Progress Notes (Signed)
Subjective:  Patient ID: Tyler Walters, male    DOB: 06-11-1966  Age: 52 y.o. MRN: [id number]  CC: HTN and LE edema  HPI Tyler Walters is a 52 y.o. male with a medical history of morbid obesity, nephrolithiasis, left tib/fib fracture, and lipoma presents on HTN and bilateral LE edema f/u. Last BP 161/88 mmHg. Prescribed HCTZ 25 mg. BP 138/88 mmHg today. Still has LE edema bilaterally with only minimal improvement. Not currently using compression stockings but stockings are due to arrive in the mail soon. Overall, feeling better. No CP, palpitations, SOB, HA, f/c/n/v, rash, or GI/GU sxs.       Outpatient Medications Prior to Visit  Medication Sig Dispense Refill  . enoxaparin (LOVENOX) 100 MG/ML injection Inject 0.9 mLs (90 mg total) into the skin daily. 30 Syringe 0  . hydrochlorothiazide (HYDRODIURIL) 25 MG tablet Take 1 tablet (25 mg total) by mouth daily. Take on tablet in the morning. 90 tablet 1  . methocarbamol (ROBAXIN) 750 MG tablet Take 1 tablet (750 mg total) by mouth every 6 (six) hours as needed for muscle spasms. 75 tablet 0  . polyethylene glycol (MIRALAX / GLYCOLAX) packet Take 17 g by mouth daily.    . tamsulosin (FLOMAX) 0.4 MG CAPS capsule Take 1 capsule (0.4 mg total) by mouth daily after supper. 30 capsule 0  . cephALEXin (KEFLEX) 500 MG capsule Take 500 mg by mouth 3 (three) times daily.  0  . oxyCODONE (OXY IR/ROXICODONE) 5 MG immediate release tablet Take 1-2 tablets (5-10 mg total) by mouth every 6 (six) hours as needed for severe pain. 52 tablet 0  . oxyCODONE-acetaminophen (PERCOCET/ROXICET) 5-325 MG tablet Take 1 tablet by mouth every 6 (six) hours as needed.  0   No facility-administered medications prior to visit.      ROS Review of Systems  Constitutional: Negative for chills, fever and malaise/fatigue.  Eyes: Negative for blurred vision.  Respiratory: Negative for shortness of breath.   Cardiovascular: Positive for leg swelling. Negative for chest pain  and palpitations.  Gastrointestinal: Negative for abdominal pain and nausea.  Genitourinary: Negative for dysuria and hematuria.  Musculoskeletal: Negative for joint pain and myalgias.  Skin: Negative for rash.  Neurological: Negative for tingling and headaches.  Psychiatric/Behavioral: Negative for depression. The patient is not nervous/anxious.     Objective:  BP 138/88 (BP Location: Right Arm, Patient Position: Sitting, Cuff Size: Large)   Pulse 92   Temp 97.9 F (36.6 C) (Oral)   SpO2 99%   BP/Weight 03/07/2018 02/15/2018 8/67/6720  Systolic BP 947 096 283  Diastolic BP 88 88 98  Wt. (Lbs) - - 325  BMI - - 49.42      Physical Exam  Constitutional: He is oriented to person, place, and time.  Well developed, obese, NAD, polite  HENT:  Head: Normocephalic and atraumatic.  Eyes: No scleral icterus.  Cardiovascular: Normal rate, regular rhythm and normal heart sounds.  Bilaterally LE edema; left 3+ pitting and right 2+ pitting.  No carotid bruits bilaterally.  Pulmonary/Chest: Effort normal and breath sounds normal.  Musculoskeletal: He exhibits no edema.  Neurological: He is alert and oriented to person, place, and time.  Skin: Skin is warm and dry. No rash noted. No erythema. No pallor.  No lesions on Lovenox injections sites on abdomen.  Psychiatric: He has a normal mood and affect. His behavior is normal. Thought content normal.  Vitals reviewed.    Assessment & Plan:    1. Hypertension,  unspecified type - furosemide (LASIX) 40 MG tablet; Take 1 tablet (40 mg total) by mouth 2 (two) times daily.  Dispense: 60 tablet; Refill: 2 - Continue HCTZ - BMP, future  2. Bilateral lower extremity edema - furosemide (LASIX) 40 MG tablet; Take 1 tablet (40 mg total) by mouth 2 (two) times daily.  Dispense: 60 tablet; Refill: 2 - Continue HCTZ - BMP, future  3. Need for Tdap vaccination - Tdap vaccine greater than or equal to 7yo IM  4. Screening for colon cancer - Fecal  occult blood, imunochemical   Meds ordered this encounter  Medications  . furosemide (LASIX) 40 MG tablet    Sig: Take 1 tablet (40 mg total) by mouth 2 (two) times daily.    Dispense:  60 tablet    Refill:  2    Order Specific Question:   Supervising Provider    Answer:   Tresa Garter [id number]    Follow-up: Return in about 3 weeks (around 03/28/2018) for  BMP for potassium check. Lab only.Clent Demark PA

## 2018-03-08 ENCOUNTER — Telehealth: Payer: Self-pay

## 2018-03-08 MED ORDER — TAMSULOSIN HCL 0.4 MG PO CAPS: 0.4000 mg | ORAL_CAPSULE | Freq: Every day | ORAL | 1 refills | Status: DC

## 2018-03-08 NOTE — Telephone Encounter (Signed)
Yes, rx sent to pharmacy again. ([facility] Perhaps they aren't filling it?

## 2018-03-08 NOTE — Telephone Encounter (Signed)
Pt called asking if he is to stay on Tamsulosin given to him at discharge by Pam. If so needs a refill

## 2018-03-09 MED FILL — TAMSULOSIN HCL 0.4 MG CAP: 0.4 | 30 days supply | Qty: 30 | Fill #0

## 2018-03-09 NOTE — Telephone Encounter (Signed)
Patient informed of prescription and need to continue it, stated will pick up meds within the hour.

## 2018-03-20 ENCOUNTER — Ambulatory Visit (INDEPENDENT_AMBULATORY_CARE_PROVIDER_SITE_OTHER): Payer: Self-pay | Admitting: Emergency Medicine

## 2018-03-20 ENCOUNTER — Encounter: Payer: Self-pay | Admitting: Emergency Medicine

## 2018-03-20 VITALS — BP 140/88 | HR 101 | Ht 68.0 in | Wt 399.8 lb

## 2018-03-20 DIAGNOSIS — I272 Pulmonary hypertension, unspecified: Secondary | ICD-10-CM

## 2018-03-20 DIAGNOSIS — G4733 Obstructive sleep apnea (adult) (pediatric): Secondary | ICD-10-CM

## 2018-03-20 DIAGNOSIS — I2721 Secondary pulmonary arterial hypertension: Secondary | ICD-10-CM

## 2018-03-20 HISTORY — DX: Secondary pulmonary arterial hypertension: I27.21

## 2018-03-20 NOTE — Assessment & Plan Note (Signed)
Suspected based on enlarged pulmonary artery on a CT-PA that was done when he was in the emergency department.  Based on his history suspect that contribute and factors include his systemic hypertension, undiagnosed but trouble obstructive sleep apnea.  No other significant exposures.  He does not have a history of asthma or innate lung disease.  CT chest did not show any interstitial disease.   He needs a echocardiogram to make a better estimate of his pulmonary pressures.  Pulmonary function testing to assess for underlying lung disease.  Most importantly he needs a sleep study as I suspect he has obstructive sleep apnea based on his description and his wife's description.  At a future visit we likely need to perform walking oximetry although it is not clear that his ankle will let him do that at this time.  Depending on the evaluation above we may decide to expand it and perform autoimmune labs to look for underlying connective tissue disease although my suspicion is low.

## 2018-03-20 NOTE — Progress Notes (Signed)
Subjective:    Patient ID: Tyler Walters, male    DOB: 20-Oct-1966, 52 y.o.   MRN: [id number]  HPI 52 year old never smoker with a history of obesity, nephrolithiasis, hypertension.  He is referred today for possible pulmonary hypertension.  He notes that he has had episodes of chest pressure that occurred at random and at rest on 02/14/18. A CT chest was done that showed no PE, estimated some PAH based on size of his main PA. Troponins were negative. He has had some similar symptoms since but to a lessor degree. Seems to bother him most at night. No overt GERD sx. his overall functional capacity is quite limited by arthritic pain, ankle pain, his obesity.  Never diet pills  Review of Systems  Constitutional: Negative for unexpected weight change.  HENT: Positive for congestion, postnasal drip and sinus pressure. Negative for dental problem, ear pain, nosebleeds, rhinorrhea, sneezing, sore throat and trouble swallowing.   Eyes: Negative for redness and itching.  Respiratory: Positive for chest tightness. Negative for cough, shortness of breath and wheezing.   Cardiovascular: Positive for palpitations and leg swelling.  Gastrointestinal: Negative for nausea and vomiting.  Genitourinary: Negative for dysuria.  Musculoskeletal: Positive for joint swelling.  Skin: Negative for rash.  Allergic/Immunologic: Negative.  Negative for environmental allergies, food allergies and immunocompromised state.  Neurological: Negative for headaches.  Hematological: Does not bruise/bleed easily.  Psychiatric/Behavioral: Positive for dysphoric mood. The patient is nervous/anxious.    Past Medical History:  Diagnosis Date  . Lipoma of abdominal wall    Large lipoma  . 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 Diabetes Mother   . Diabetes Father      Social History   Socioeconomic History  . Marital status: Married    Spouse name:  Not on file  . Number of children: Not on file  . Years of education: Not on file  . Highest education level: Not on file  Occupational History  . Not on file  Social Needs  . Financial resource strain: Not on file  . Food insecurity:    Worry: Not on file    Inability: Not on file  . Transportation needs:    Medical: Not on file    Non-medical: Not on file  Tobacco Use  . Smoking status: Never Smoker  . Smokeless tobacco: Never Used  Substance and Sexual Activity  . Alcohol use: No  . Drug use: No  . Sexual activity: Not on file  Lifestyle  . Physical activity:    Days per week: Not on file    Minutes per session: Not on file  . Stress: Not on file  Relationships  . Social connections:    Talks on phone: Not on file    Gets together: Not on file    Attends religious service: Not on file    Active member of club or organization: Not on file    Attends meetings of clubs or organizations: Not on file    Relationship status: Not on file  . Intimate partner violence:    Fear of current or ex partner: Not on file    Emotionally abused: Not on file    Physically abused: Not on file    Forced sexual activity: Not on file  Other Topics Concern  . 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   Allergies  Allergen Reactions  . Morphine And Related Nausea Only  . Tape Itching and Rash     Outpatient Medications Prior to Visit  Medication Sig Dispense Refill  . docusate sodium (COLACE) 100 MG capsule Take 200 mg by mouth daily.    . furosemide (LASIX) 40 MG tablet Take 1 tablet (40 mg total) by mouth 2 (two) times daily. 60 tablet 2  . oxyCODONE-acetaminophen (PERCOCET/ROXICET) 5-325 MG tablet Take 1 tablet by mouth every 6 (six) hours as needed for severe pain.    . polyethylene glycol (MIRALAX / GLYCOLAX) packet Take 17 g by mouth daily.      . tamsulosin (FLOMAX) 0.4 MG CAPS capsule Take 1 capsule (0.4 mg total) by mouth daily after supper. 30 capsule 1  . enoxaparin (LOVENOX) 100 MG/ML injection Inject 0.9 mLs (90 mg total) into the skin daily. (Patient not taking: Reported on 03/20/2018) 30 Syringe 0  . hydrochlorothiazide (HYDRODIURIL) 25 MG tablet Take 1 tablet (25 mg total) by mouth daily. Take on tablet in the morning. (Patient not taking: Reported on 03/20/2018) 90 tablet 1  . methocarbamol (ROBAXIN) 750 MG tablet Take 1 tablet (750 mg total) by mouth every 6 (six) hours as needed for muscle spasms. (Patient not taking: Reported on 03/20/2018) 75 tablet 0   No facility-administered medications prior to visit.         Objective:   Physical Exam Vitals:   03/20/18 1446 03/20/18 1447  BP:  140/88  Pulse:  (!) 101  SpO2:  97%  Weight: (!) 399 lb 12.8 oz (181.3 kg)   Height: 5\' 8"  (1.727 m)    Gen: Pleasant, morbidly obese gentleman, in no distress,  normal affect  ENT: No lesions,  mouth clear,  oropharynx clear, no postnasal drip  Neck: No JVD, no stridor  Lungs: No use of accessory muscles, clear superiorly, he does have some bilateral inspiratory crackles at both bases  Cardiovascular: RRR, heart sounds normal, no murmur or gallops, 2+ pretibial pitting edema  Abdomen: soft and NT, healing scar underneath his pannus from his lipoma resection  Musculoskeletal: No deformities, no cyanosis or clubbing  Neuro: alert, non focal  Skin: Warm, no lesions or rashes       Assessment & Plan:  Secondary pulmonary arterial hypertension ([place]) Suspected based on enlarged pulmonary artery on a CT-PA that was done when he was in the emergency department.  Based on his history suspect that contribute and factors include his systemic hypertension, undiagnosed but trouble obstructive sleep apnea.  No other significant exposures.  He does not have a history of asthma or innate lung disease.  CT chest did not show any  interstitial disease.   He needs a echocardiogram to make a better estimate of his pulmonary pressures.  Pulmonary function testing to assess for underlying lung disease.  Most importantly he needs a sleep study as I suspect he has obstructive sleep apnea based on his description and his wife's description.  At a future visit we likely need to perform walking oximetry although it is not clear that his ankle will let him do that at this time.  Depending on the evaluation above we may decide to expand it and perform autoimmune labs to look for underlying connective tissue disease although my suspicion is low.  Baltazar Apo, MD, PhD 03/20/2018, 3:46 PM [facility] [phone] or if no answer [phone]

## 2018-03-20 NOTE — Patient Instructions (Signed)
We will arrange for an echocardiogram to evaluate for pulmonary hypertension. We will arrange for a split-night sleep study to evaluate for obstructive sleep apnea. We will arrange for full pulmonary function testing. Follow with Dr Lamonte Sakai next available after your testing is completed to review the results together and plan next steps.

## 2018-03-21 ENCOUNTER — Encounter: Payer: Self-pay | Attending: Registered Nurse | Admitting: Physical Medicine & Rehabilitation

## 2018-03-21 DIAGNOSIS — R609 Edema, unspecified: Secondary | ICD-10-CM | POA: Insufficient documentation

## 2018-03-21 DIAGNOSIS — R1900 Intra-abdominal and pelvic swelling, mass and lump, unspecified site: Secondary | ICD-10-CM | POA: Insufficient documentation

## 2018-03-21 DIAGNOSIS — Z833 Family history of diabetes mellitus: Secondary | ICD-10-CM | POA: Insufficient documentation

## 2018-03-21 DIAGNOSIS — Z9889 Other specified postprocedural states: Secondary | ICD-10-CM | POA: Insufficient documentation

## 2018-03-21 DIAGNOSIS — R200 Anesthesia of skin: Secondary | ICD-10-CM | POA: Insufficient documentation

## 2018-03-21 DIAGNOSIS — D171 Benign lipomatous neoplasm of skin and subcutaneous tissue of trunk: Secondary | ICD-10-CM | POA: Insufficient documentation

## 2018-03-21 DIAGNOSIS — Z6841 Body Mass Index (BMI) 40.0 and over, adult: Secondary | ICD-10-CM | POA: Insufficient documentation

## 2018-03-21 DIAGNOSIS — S82142D Displaced bicondylar fracture of left tibia, subsequent encounter for closed fracture with routine healing: Secondary | ICD-10-CM | POA: Insufficient documentation

## 2018-03-21 DIAGNOSIS — R42 Dizziness and giddiness: Secondary | ICD-10-CM | POA: Insufficient documentation

## 2018-03-21 MED FILL — HYDROCHLOROTHIAZIDE 25 MG T: 25 | 30 days supply | Qty: 30 | Fill #1

## 2018-03-26 ENCOUNTER — Other Ambulatory Visit: Payer: Self-pay

## 2018-03-26 ENCOUNTER — Ambulatory Visit (HOSPITAL_COMMUNITY): Payer: Self-pay | Attending: Cardiology

## 2018-03-26 DIAGNOSIS — I119 Hypertensive heart disease without heart failure: Secondary | ICD-10-CM | POA: Insufficient documentation

## 2018-03-26 DIAGNOSIS — I272 Pulmonary hypertension, unspecified: Secondary | ICD-10-CM | POA: Insufficient documentation

## 2018-03-26 DIAGNOSIS — E669 Obesity, unspecified: Secondary | ICD-10-CM | POA: Insufficient documentation

## 2018-04-04 LAB — FECAL OCCULT BLOOD, IMMUNOCHEMICAL: Fecal Occult Bld: NEGATIVE

## 2018-04-06 ENCOUNTER — Telehealth (INDEPENDENT_AMBULATORY_CARE_PROVIDER_SITE_OTHER): Payer: Self-pay

## 2018-04-06 NOTE — Telephone Encounter (Signed)
-----   Message from Gildardo Pounds, NP sent at 04/04/2018 11:17 PM EDT ----- Fecal occult blood test is negative

## 2018-04-06 NOTE — Telephone Encounter (Signed)
Patient is aware of negative FIT. Tempestt S Roberts, CMA  

## 2018-04-11 MED FILL — TAMSULOSIN HCL 0.4 MG CAP: 0.4 | 30 days supply | Qty: 30 | Fill #1

## 2018-04-11 MED FILL — FUROSEMIDE 40 MG TAB: 40 | 30 days supply | Qty: 60 | Fill #1

## 2018-04-14 ENCOUNTER — Ambulatory Visit (HOSPITAL_BASED_OUTPATIENT_CLINIC_OR_DEPARTMENT_OTHER): Payer: Self-pay | Attending: Emergency Medicine | Admitting: Pulmonary Disease

## 2018-04-14 VITALS — Ht 68.0 in | Wt >= 6400 oz

## 2018-04-14 DIAGNOSIS — I272 Pulmonary hypertension, unspecified: Secondary | ICD-10-CM | POA: Insufficient documentation

## 2018-04-14 DIAGNOSIS — G4736 Sleep related hypoventilation in conditions classified elsewhere: Secondary | ICD-10-CM | POA: Insufficient documentation

## 2018-04-14 DIAGNOSIS — G4733 Obstructive sleep apnea (adult) (pediatric): Secondary | ICD-10-CM | POA: Insufficient documentation

## 2018-04-21 DIAGNOSIS — G4733 Obstructive sleep apnea (adult) (pediatric): Secondary | ICD-10-CM

## 2018-04-21 NOTE — Procedures (Signed)
    Patient Name: Tyler Walters, Tyler Walters Date: 04/14/2018 Gender: Male D.O.B: 04-26-1966 Age (years): 52 Referring Provider: Baltazar Apo Height (inches): 20 Interpreting Physician: Chesley Mires MD, ABSM Weight (lbs): 400 RPSGT: Lanae Boast BMI: 61 MRN: [id number] Neck Size: 17.00  CLINICAL INFORMATION Sleep Study Type: NPSG  Indication for sleep study: History of pulmonary hypertension.  Epworth Sleepiness Score: 3  SLEEP STUDY TECHNIQUE As per the AASM Manual for the Scoring of Sleep and Associated Events v2.3 (April 2016) with a hypopnea requiring 4% desaturations.  The channels recorded and monitored were frontal, central and occipital EEG, electrooculogram (EOG), submentalis EMG (chin), nasal and oral airflow, thoracic and abdominal wall motion, anterior tibialis EMG, snore microphone, electrocardiogram, and pulse oximetry.  MEDICATIONS Medications self-administered by patient taken the night of the study : N/A  SLEEP ARCHITECTURE The study was initiated at 10:30:34 PM and ended at 4:36:20 AM.  Sleep onset time was 27.4 minutes and the sleep efficiency was 64.8%. The total sleep time was 237 minutes.  Stage REM latency was 61.5 minutes.  The patient spent 2.95% of the night in stage N1 sleep, 72.36% in stage N2 sleep, 0.00% in stage N3 and 24.68% in REM.  Alpha intrusion was absent.  Supine sleep was 0.00%.  RESPIRATORY PARAMETERS The overall apnea/hypopnea index (AHI) was 16.7 per hour. There were 4 total apneas, including 4 obstructive, 0 central and 0 mixed apneas. There were 62 hypopneas and 3 RERAs.  The AHI during Stage REM sleep was 29.7 per hour.  AHI while supine was N/A per hour.  The mean oxygen saturation was 92.44%. The minimum SpO2 during sleep was 72.00%.  soft snoring was noted during this study.  CARDIAC DATA The 2 lead EKG demonstrated sinus rhythm. The mean heart rate was 89.21 beats per minute. Other EKG findings include:  PVCs.  LEG MOVEMENT DATA The total PLMS were 0 with a resulting PLMS index of 0.00. Associated arousal with leg movement index was 0.0 .  IMPRESSIONS - Moderate obstructive sleep apnea with an AHI of 16.7 and SpO2 low of 72%. - He slept in a recliner during the study. - He reported claustrophobia from attempt at wearing full face CPAP mask.  DIAGNOSIS - Obstructive Sleep Apnea (327.23 [G47.33 ICD-10]) - Nocturnal Hypoxemia (327.26 [G47.36 ICD-10])  RECOMMENDATIONS - Additional therapies include weight loss, CPAP, oral appliance, or surgical assessment.  [Electronically signed] 04/21/2018 04:28 PM  Chesley Mires MD, [place], American Board of Sleep Medicine NPI: [id number]

## 2018-04-30 MED FILL — HYDROCHLOROTHIAZIDE 25 MG T: 25 | 30 days supply | Qty: 30 | Fill #2

## 2018-05-03 ENCOUNTER — Ambulatory Visit (INDEPENDENT_AMBULATORY_CARE_PROVIDER_SITE_OTHER): Payer: Self-pay | Admitting: Emergency Medicine

## 2018-05-03 ENCOUNTER — Encounter: Payer: Self-pay | Admitting: Emergency Medicine

## 2018-05-03 VITALS — BP 136/88 | HR 93 | Ht 68.0 in | Wt >= 6400 oz

## 2018-05-03 DIAGNOSIS — G4733 Obstructive sleep apnea (adult) (pediatric): Secondary | ICD-10-CM

## 2018-05-03 DIAGNOSIS — I2721 Secondary pulmonary arterial hypertension: Secondary | ICD-10-CM

## 2018-05-03 DIAGNOSIS — I272 Pulmonary hypertension, unspecified: Secondary | ICD-10-CM

## 2018-05-03 NOTE — Progress Notes (Signed)
Subjective:    Patient ID: Tyler Walters, male    DOB: 10/04/1966, 52 y.o.   MRN: [id number]  HPI 52 year old never smoker with a history of obesity, nephrolithiasis, hypertension.  He is referred today for possible pulmonary hypertension.  He notes that he has had episodes of chest pressure that occurred at random and at rest on 02/14/18. A CT chest was done that showed no PE, estimated some PAH based on size of his main PA. Troponins were negative. He has had some similar symptoms since but to a lessor degree. Seems to bother him most at night. No overt GERD sx. his overall functional capacity is quite limited by arthritic pain, ankle pain, his obesity.  Never diet pills  ROV 05/03/18 -- follow up visit for probable secondary PAH. He has PSG 04/14/18, showed AHI 16.7 but he couldn't tolerate the CPAP due to claustrophobia. He couldn't do PFT either . Again due to something on his face.    Review of Systems  Constitutional: Negative for unexpected weight change.  HENT: Positive for congestion, postnasal drip and sinus pressure. Negative for dental problem, ear pain, nosebleeds, rhinorrhea, sneezing, sore throat and trouble swallowing.   Eyes: Negative for redness and itching.  Respiratory: Positive for chest tightness. Negative for cough, shortness of breath and wheezing.   Cardiovascular: Positive for palpitations and leg swelling.  Gastrointestinal: Negative for nausea and vomiting.  Genitourinary: Negative for dysuria.  Musculoskeletal: Positive for joint swelling.  Skin: Negative for rash.  Allergic/Immunologic: Negative.  Negative for environmental allergies, food allergies and immunocompromised state.  Neurological: Negative for headaches.  Hematological: Does not bruise/bleed easily.  Psychiatric/Behavioral: Positive for dysphoric mood. The patient is nervous/anxious.    Past Medical History:  Diagnosis Date  . Lipoma of abdominal wall    Large lipoma  . Secondary pulmonary  arterial hypertension ([place]) 03/20/2018     Family History  Problem Relation Age of Onset  . Diabetes Mother   . Diabetes Father      Social History   Socioeconomic History  . Marital status: Married    Spouse name: Not on file  . Number of children: Not on file  . Years of education: Not on file  . Highest education level: Not on file  Occupational History  . Not on file  Social Needs  . Financial resource strain: Not on file  . Food insecurity:    Worry: Not on file    Inability: Not on file  . Transportation needs:    Medical: Not on file    Non-medical: Not on file  Tobacco Use  . Smoking status: Never Smoker  . Smokeless tobacco: Never Used  Substance and Sexual Activity  . Alcohol use: No  . Drug use: No  . Sexual activity: Not on file  Lifestyle  . Physical activity:    Days per week: Not on file    Minutes per session: Not on file  . Stress: Not on file  Relationships  . Social connections:    Talks on phone: Not on file    Gets together: Not on file    Attends religious service: Not on file    Active member of club or organization: Not on file    Attends meetings of clubs or organizations: Not on file    Relationship status: Not on file  . Intimate partner violence:    Fear of current or ex partner: Not on file    Emotionally abused: Not on file  Physically abused: Not on file    Forced sexual activity: Not on file  Other Topics Concern  . 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   Allergies  Allergen Reactions  . Morphine And Related Nausea Only  . Tape Itching and Rash     Outpatient Medications Prior to Visit  Medication Sig Dispense Refill  . docusate sodium (COLACE) 100 MG capsule Take 200 mg by mouth daily.    Marland Kitchen enoxaparin (LOVENOX) 100 MG/ML injection Inject 0.9 mLs (90 mg total) into the skin daily.  30 Syringe 0  . furosemide (LASIX) 40 MG tablet Take 1 tablet (40 mg total) by mouth 2 (two) times daily. 60 tablet 2  . hydrochlorothiazide (HYDRODIURIL) 25 MG tablet Take 1 tablet (25 mg total) by mouth daily. Take on tablet in the morning. 90 tablet 1  . methocarbamol (ROBAXIN) 750 MG tablet Take 1 tablet (750 mg total) by mouth every 6 (six) hours as needed for muscle spasms. 75 tablet 0  . oxyCODONE-acetaminophen (PERCOCET/ROXICET) 5-325 MG tablet Take 1 tablet by mouth every 6 (six) hours as needed for severe pain.    . polyethylene glycol (MIRALAX / GLYCOLAX) packet Take 17 g by mouth daily.    . tamsulosin (FLOMAX) 0.4 MG CAPS capsule Take 1 capsule (0.4 mg total) by mouth daily after supper. 30 capsule 1   No facility-administered medications prior to visit.         Objective:   Physical Exam Vitals:   05/03/18 1122  BP: 136/88  Pulse: 93  SpO2: 98%  Weight: (!) 404 lb (183.3 kg)  Height: 5\' 8"  (2.426 m)   Gen: Pleasant, morbidly obese gentleman, in no distress,  normal affect  ENT: No lesions,  mouth clear,  oropharynx clear, no postnasal drip  Neck: No JVD, no stridor  Lungs: No use of accessory muscles, clear superiorly, he does have some bilateral inspiratory crackles at both bases  Cardiovascular: RRR, heart sounds normal, no murmur or gallops, 2+ pretibial pitting edema  Abdomen: soft and NT, healing scar underneath his pannus from his lipoma resection  Musculoskeletal: No deformities, no cyanosis or clubbing  Neuro: alert, non focal  Skin: Warm, no lesions or rashes       Assessment & Plan:  Secondary pulmonary arterial hypertension (HCC) Suspect being driven by his OSA/OHS.  He did have documented OSA with associated desaturations.  I am going to start him on oxygen for now but definitive therapy will be important.  He could not tolerate CPAP so I am going to refer him for possible dental appliance.  Hopefully this will be successful.  We will also walk  today to evaluate for possible daytime desaturation.  If he does desaturate then he will need oxygen with exertion.  We talked about weight loss as an overall strategy.  He is working on this.  I was unable to evaluate for obstructive lung disease because he could not tolerate the PFT.  We may decide to retry this at some point in the future.  We will start oxygen at 2L/min at night We will refer you for evaluation for a dental device for sleep apnea.  Continue to work on your exercise and weight loss We will check walking oximetry today.  Follow with Dr Lamonte Sakai in 2 months or sooner if you have any problems.  Baltazar Apo, MD, PhD 05/03/2018, 11:52 AM [facility] [phone] or if no answer [phone]

## 2018-05-03 NOTE — Progress Notes (Signed)
Patient refused PFT 05/03/18 due to not wanting to use the mouth piece or well the nose clip. Dr. Lamonte Sakai has been advised.

## 2018-05-03 NOTE — Addendum Note (Signed)
Addended by: Vivia Ewing on: 05/03/2018 04:43 PM   Modules accepted: Orders

## 2018-05-03 NOTE — Assessment & Plan Note (Addendum)
Echocardiogram shows mild RA and RV dilation.  They were able to estimate his PA pressures.  Suspect being driven by his OSA/OHS.  He did have documented OSA with associated desaturations.  I am going to start him on oxygen for now but definitive therapy will be important.  He could not tolerate CPAP so I am going to refer him for possible dental appliance.  Hopefully this will be successful.  We will also walk today to evaluate for possible daytime desaturation.  If he does desaturate then he will need oxygen with exertion.  We talked about weight loss as an overall strategy.  He is working on this.  I was unable to evaluate for obstructive lung disease because he could not tolerate the PFT.  We may decide to retry this at some point in the future.  We will start oxygen at 2L/min at night We will refer you for evaluation for a dental device for sleep apnea.  Continue to work on your exercise and weight loss We will check walking oximetry today.  Follow with Dr Lamonte Sakai in 2 months or sooner if you have any problems.

## 2018-05-03 NOTE — Patient Instructions (Signed)
We will start oxygen at 2L/min at night We will refer you for evaluation for a dental device for sleep apnea.  Continue to work on your exercise and weight loss We will check walking oximetry today.  Follow with Dr Lamonte Sakai in 2 months or sooner if you have any problems.

## 2018-05-14 MED FILL — FUROSEMIDE 40 MG TAB: 40 | 30 days supply | Qty: 60 | Fill #2

## 2018-05-16 ENCOUNTER — Encounter: Payer: Self-pay | Admitting: Emergency Medicine

## 2018-06-01 MED FILL — HYDROCHLOROTHIAZIDE 25 MG T: 25 | 30 days supply | Qty: 30 | Fill #3

## 2018-06-27 ENCOUNTER — Telehealth (INDEPENDENT_AMBULATORY_CARE_PROVIDER_SITE_OTHER): Payer: Self-pay | Admitting: Physician Assistant

## 2018-06-27 NOTE — Telephone Encounter (Signed)
Patient called requesting a refill on furosemide (LASIX) 40 MG tablet Patient uses [facility] pharmacy. Please f/u

## 2018-06-27 NOTE — Telephone Encounter (Signed)
Needs clinic visit for BP check and potassium check.

## 2018-06-28 NOTE — Telephone Encounter (Signed)
Patient is aware and was able to make an appointment.  Thank you Tyler Walters

## 2018-07-03 ENCOUNTER — Ambulatory Visit: Payer: Self-pay | Admitting: Emergency Medicine

## 2018-07-05 MED FILL — HYDROCHLOROTHIAZIDE 25 MG T: 25 | 30 days supply | Qty: 30 | Fill #4

## 2018-07-16 ENCOUNTER — Encounter: Payer: Self-pay | Admitting: Emergency Medicine

## 2018-07-16 ENCOUNTER — Ambulatory Visit (INDEPENDENT_AMBULATORY_CARE_PROVIDER_SITE_OTHER): Payer: Self-pay | Admitting: Emergency Medicine

## 2018-07-16 VITALS — BP 134/86 | HR 99 | Ht 68.0 in | Wt >= 6400 oz

## 2018-07-16 DIAGNOSIS — G4733 Obstructive sleep apnea (adult) (pediatric): Secondary | ICD-10-CM | POA: Insufficient documentation

## 2018-07-16 DIAGNOSIS — Z23 Encounter for immunization: Secondary | ICD-10-CM

## 2018-07-16 DIAGNOSIS — I2721 Secondary pulmonary arterial hypertension: Secondary | ICD-10-CM

## 2018-07-16 NOTE — Assessment & Plan Note (Signed)
Confirmed by PSG 04/14/2018.  He is unable to tolerate CPAP.  I referred him for a dental appliance but he has not been evaluated.  Suspect that this may be due to the changes that are going on in his insurance coverage.  I like to refer him back.  I will defer initiating oxygen until we determine whether an oral appliance is going to be to be used.  He may need a repeat ONO with the appliance in place.

## 2018-07-16 NOTE — Progress Notes (Signed)
Subjective:    Patient ID: Tyler Walters, male    DOB: [birth year], 52 y.o.   MRN: [id number]  HPI 52 year old never smoker with a history of obesity, nephrolithiasis, hypertension.  He is referred today for possible pulmonary hypertension.  He notes that he has had episodes of chest pressure that occurred at random and at rest on 02/14/18. A CT chest was done that showed no PE, estimated some PAH based on size of his main PA. Troponins were negative. He has had some similar symptoms since but to a lessor degree. Seems to bother him most at night. No overt GERD sx. his overall functional capacity is quite limited by arthritic pain, ankle pain, his obesity.  Never diet pills  ROV 05/03/18 -- follow up visit for probable secondary PAH. He has PSG 04/14/18, showed AHI 16.7 but he couldn't tolerate the CPAP due to claustrophobia. He couldn't do PFT either . Again due to something on his face.   ROV 07/16/18 --this is a follow-up visit for patient with suspected pulmonary hypertension based on enlarged pulmonary artery on CT scan of the chest.  He has obstructive sleep apnea that is currently untreated due to difficulty wearing CPAP.  I referred him for possible dental appliance. He ran out of his lasix 3 weeks ago - has had more LE edema, may have more nocturnal SOB. He did not see dentistry regarding treatment for his OSA. He now has insurance, just approved, undergoing hearing also for medicaid.    Review of Systems  Constitutional: Negative for unexpected weight change.  HENT: Positive for congestion, postnasal drip and sinus pressure. Negative for dental problem, ear pain, nosebleeds, rhinorrhea, sneezing, sore throat and trouble swallowing.   Eyes: Negative for redness and itching.  Respiratory: Positive for chest tightness. Negative for cough, shortness of breath and wheezing.   Cardiovascular: Positive for palpitations and leg swelling.  Gastrointestinal: Negative for nausea and vomiting.    Genitourinary: Negative for dysuria.  Musculoskeletal: Positive for joint swelling.  Skin: Negative for rash.  Allergic/Immunologic: Negative.  Negative for environmental allergies, food allergies and immunocompromised state.  Neurological: Negative for headaches.  Hematological: Does not bruise/bleed easily.  Psychiatric/Behavioral: Positive for dysphoric mood. The patient is nervous/anxious.    Past Medical History:  Diagnosis Date  . Lipoma of abdominal wall    Large lipoma  . Secondary pulmonary arterial hypertension ([place]) 03/20/2018     Family History  Problem Relation Age of Onset  . Diabetes Mother   . Diabetes Father      Social History   Socioeconomic History  . Marital status: Married    Spouse name: Not on file  . Number of children: Not on file  . Years of education: Not on file  . Highest education level: Not on file  Occupational History  . Not on file  Social Needs  . Financial resource strain: Not on file  . Food insecurity:    Worry: Not on file    Inability: Not on file  . Transportation needs:    Medical: Not on file    Non-medical: Not on file  Tobacco Use  . Smoking status: Never Smoker  . Smokeless tobacco: Never Used  Substance and Sexual Activity  . Alcohol use: No  . Drug use: No  . Sexual activity: Not on file  Lifestyle  . Physical activity:    Days per week: Not on file    Minutes per session: Not on file  . Stress: Not  on file  Relationships  . Social connections:    Talks on phone: Not on file    Gets together: Not on file    Attends religious service: Not on file    Active member of club or organization: Not on file    Attends meetings of clubs or organizations: Not on file    Relationship status: Not on file  . Intimate partner violence:    Fear of current or ex partner: Not on file    Emotionally abused: Not on file    Physically abused: Not on file    Forced sexual activity: Not on file  Other Topics Concern  . 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   Allergies  Allergen Reactions  . Morphine And Related Nausea Only  . Tape Itching and Rash     Outpatient Medications Prior to Visit  Medication Sig Dispense Refill  . hydrochlorothiazide (HYDRODIURIL) 25 MG tablet Take 1 tablet (25 mg total) by mouth daily. Take on tablet in the morning. 90 tablet 1  . oxyCODONE-acetaminophen (PERCOCET/ROXICET) 5-325 MG tablet Take 1 tablet by mouth every 6 (six) hours as needed for severe pain.    . furosemide (LASIX) 40 MG tablet Take 1 tablet (40 mg total) by mouth 2 (two) times daily. (Patient not taking: Reported on 07/16/2018) 60 tablet 2  . docusate sodium (COLACE) 100 MG capsule Take 200 mg by mouth daily.    Marland Kitchen enoxaparin (LOVENOX) 100 MG/ML injection Inject 0.9 mLs (90 mg total) into the skin daily. 30 Syringe 0  . methocarbamol (ROBAXIN) 750 MG tablet Take 1 tablet (750 mg total) by mouth every 6 (six) hours as needed for muscle spasms. 75 tablet 0  . polyethylene glycol (MIRALAX / GLYCOLAX) packet Take 17 g by mouth daily.    . tamsulosin (FLOMAX) 0.4 MG CAPS capsule Take 1 capsule (0.4 mg total) by mouth daily after supper. 30 capsule 1   No facility-administered medications prior to visit.         Objective:   Physical Exam Vitals:   07/16/18 0916  BP: 134/86  Pulse: 99  SpO2: 98%  Weight: (!) 409 lb (185.5 kg)  Height: 5\' 8"  (1.727 m)   Gen: Pleasant, morbidly obese gentleman, in no distress,  normal affect  ENT: No lesions,  mouth clear,  oropharynx clear, no postnasal drip  Neck: No JVD, no stridor  Lungs: No use of accessory muscles, clear superiorly, he does have some bilateral inspiratory crackles at both bases  Cardiovascular: RRR, heart sounds normal, no murmur or gallops, 1+ pretibial pitting edema  Musculoskeletal: No deformities, no cyanosis or  clubbing  Neuro: alert, non focal  Skin: Warm, no lesions or rashes       Assessment & Plan:  Secondary pulmonary arterial hypertension (HCC) Suspected based on his CT chest, size of his PA.  Also some RV dilation on echocardiogram (unable to estimate his PASP)  Likely due to his obstructive sleep apnea.  Need to get him on good therapy for OSA.   He is on maintenance diuretic but he ran out 3 weeks ago.  He is noticed some increased weight, swelling, nocturnal dyspnea.  I will restart his maintenance Lasix 40 mg twice a day.  He has plans to see his primary care later this month.  They can assess his  renal function, decide whether to adjust the dosing, continue the prescription long-term.  Obstructive sleep apnea Confirmed by PSG 04/14/2018.  He is unable to tolerate CPAP.  I referred him for a dental appliance but he has not been evaluated.  Suspect that this may be due to the changes that are going on in his insurance coverage.  I like to refer him back.  I will defer initiating oxygen until we determine whether an oral appliance is going to be to be used.  He may need a repeat ONO with the appliance in place.  Baltazar Apo, MD, PhD 07/16/2018, 9:35 AM [facility] [phone] or if no answer [phone]

## 2018-07-16 NOTE — Patient Instructions (Signed)
We will refill your maintenance Lasix 40 mg twice a day until you can follow-up with your primary care provider to get your long-term prescription. We still need to refer you to dentistry regarding an oral appliance to try and treat your obstructive sleep apnea.  We will send the referral again today. I will hold off on ordering oxygen for now.  I would prefer to confirm your oxygen levels once you have the oral appliance in place.  If you continue to have low oxygen levels at that time that we will order submental oxygen for you. Flu shot today Follow with Dr Lamonte Sakai in 3 months or sooner if you have any problems.

## 2018-07-16 NOTE — Assessment & Plan Note (Addendum)
Suspected based on his CT chest, size of his PA.  Also some RV dilation on echocardiogram (unable to estimate his PASP)  Likely due to his obstructive sleep apnea.  Need to get him on good therapy for OSA.   He is on maintenance diuretic but he ran out 3 weeks ago.  He is noticed some increased weight, swelling, nocturnal dyspnea.  I will restart his maintenance Lasix 40 mg twice a day.  He has plans to see his primary care later this month.  They can assess his renal function, decide whether to adjust the dosing, continue the prescription long-term.

## 2018-07-17 ENCOUNTER — Other Ambulatory Visit (INDEPENDENT_AMBULATORY_CARE_PROVIDER_SITE_OTHER): Payer: Self-pay | Admitting: Physician Assistant

## 2018-07-17 DIAGNOSIS — R600 Localized edema: Secondary | ICD-10-CM

## 2018-07-17 DIAGNOSIS — I10 Essential (primary) hypertension: Secondary | ICD-10-CM

## 2018-07-17 MED FILL — FUROSEMIDE 40 MG TAB: 40 | 30 days supply | Qty: 60 | Fill #0

## 2018-07-17 NOTE — Telephone Encounter (Signed)
FWD to PCP. Tyler Walters, CMA  

## 2018-07-20 ENCOUNTER — Telehealth: Payer: Self-pay | Admitting: Emergency Medicine

## 2018-07-20 NOTE — Telephone Encounter (Signed)
Called and spoke to patient. Patient stated that he needs his Lasix Rx. Looked in [place] and see that PCP Altamease Oiler has already sent in Rx with 2 refills on 07/17/18. [facility] and patient's Rx has been filled and ready for pickup. Called patient back and let him know. Nothing further needed at this time.

## 2018-07-31 ENCOUNTER — Ambulatory Visit (INDEPENDENT_AMBULATORY_CARE_PROVIDER_SITE_OTHER): Payer: Self-pay | Admitting: Physician Assistant

## 2018-08-23 MED FILL — HYDROCHLOROTHIAZIDE 25 MG T: 25 | 30 days supply | Qty: 30 | Fill #5

## 2018-08-23 MED FILL — FUROSEMIDE 40 MG TAB: 40 | 30 days supply | Qty: 60 | Fill #1

## 2018-08-23 MED FILL — AMOXICILLIN 500 MG CAPSULE: 500 | 7 days supply | Qty: 21 | Fill #0

## 2018-08-23 MED FILL — ACETAMINOPHEN/COD #3 TABLET: 300-30 | 3 days supply | Qty: 12 | Fill #0

## 2018-09-25 ENCOUNTER — Encounter (INDEPENDENT_AMBULATORY_CARE_PROVIDER_SITE_OTHER): Payer: Self-pay | Admitting: Physician Assistant

## 2018-09-25 ENCOUNTER — Other Ambulatory Visit: Payer: Self-pay

## 2018-09-25 ENCOUNTER — Ambulatory Visit (INDEPENDENT_AMBULATORY_CARE_PROVIDER_SITE_OTHER): Payer: Medicare Other | Admitting: Physician Assistant

## 2018-09-25 VITALS — BP 120/79 | HR 92 | Temp 97.8°F | Ht 68.0 in | Wt >= 6400 oz

## 2018-09-25 DIAGNOSIS — R7303 Prediabetes: Secondary | ICD-10-CM

## 2018-09-25 DIAGNOSIS — Z87898 Personal history of other specified conditions: Secondary | ICD-10-CM

## 2018-09-25 DIAGNOSIS — Z029 Encounter for administrative examinations, unspecified: Secondary | ICD-10-CM

## 2018-09-25 DIAGNOSIS — I10 Essential (primary) hypertension: Secondary | ICD-10-CM

## 2018-09-25 DIAGNOSIS — M25561 Pain in right knee: Secondary | ICD-10-CM

## 2018-09-25 DIAGNOSIS — M25562 Pain in left knee: Secondary | ICD-10-CM

## 2018-09-25 DIAGNOSIS — R0789 Other chest pain: Secondary | ICD-10-CM | POA: Diagnosis not present

## 2018-09-25 DIAGNOSIS — R600 Localized edema: Secondary | ICD-10-CM | POA: Diagnosis not present

## 2018-09-25 DIAGNOSIS — R739 Hyperglycemia, unspecified: Secondary | ICD-10-CM | POA: Diagnosis not present

## 2018-09-25 LAB — POCT GLYCOSYLATED HEMOGLOBIN (HGB A1C): Hemoglobin A1C: 5.9 % — AB (ref 4.0–5.6)

## 2018-09-25 MED ORDER — NITROGLYCERIN 0.4 MG SL SUBL: 0.4000 mg | SUBLINGUAL_TABLET | SUBLINGUAL | 1 refills | Status: AC | PRN

## 2018-09-25 MED ORDER — ASPIRIN EC 81 MG PO TBEC: 81.0000 mg | DELAYED_RELEASE_TABLET | Freq: Every day | ORAL | 3 refills | Status: AC

## 2018-09-25 MED ORDER — METOPROLOL SUCCINATE ER 50 MG PO TB24: 50.0000 mg | ORAL_TABLET | Freq: Every day | ORAL | 3 refills | Status: DC

## 2018-09-25 MED ORDER — FUROSEMIDE 40 MG PO TABS: 40.0000 mg | ORAL_TABLET | Freq: Two times a day (BID) | ORAL | 5 refills | Status: DC

## 2018-09-25 MED ORDER — HYDROCHLOROTHIAZIDE 25 MG PO TABS: 25.0000 mg | ORAL_TABLET | Freq: Every day | ORAL | 5 refills | Status: DC

## 2018-09-25 NOTE — Patient Instructions (Signed)
Angina Pectoris Angina pectoris is a very bad feeling in the chest, neck, or arm. Your doctor may call it angina. There are four types of angina. Angina is caused by a lack of blood in the middle and thickest layer of the heart wall (myocardium). Angina may feel like a crushing or squeezing pain in the chest. It may feel like tightness or heavy pressure in the chest. Some people say it feels like gas, heartburn, or indigestion. Some people have symptoms other than pain. These include:  Shortness of breath.  Cold sweats.  Feeling sick to your stomach (nausea).  Feeling light-headed.  Many women have chest discomfort and some of the other symptoms. However, women often have different symptoms, such as:  Feeling tired (fatigue).  Feeling nervous for no reason.  Feeling weak for no reason.  Dizziness or fainting.  Women may have angina without any symptoms. Follow these instructions at home:  Take medicines only as told by your doctor.  Take care of other health issues as told by your doctor. These include: ? High blood pressure (hypertension). ? Diabetes.  Follow a heart-healthy diet. Your doctor can help you to choose healthy food options and make changes.  Talk to your doctor to learn more about healthy cooking methods and use them. These include: ? Roasting. ? Grilling. ? Broiling. ? Baking. ? Poaching. ? Steaming. ? Stir-frying.  Follow an exercise program approved by your doctor.  Keep a healthy weight. Lose weight as told by your doctor.  Rest when you are tired.  Learn to manage stress.  Do not use any tobacco, such as cigarettes, chewing tobacco, or electronic cigarettes. If you need help quitting, ask your doctor.  If you drink alcohol, and your doctor says it is okay, limit yourself to no more than 1 drink per day. One drink equals 12 ounces of beer, 5 ounces of wine, or 1 ounces of hard liquor.  Stop illegal drug use.  Keep all follow-up visits as told  by your doctor. This is important. Do not take these medicines unless your doctor says that you can:  Nonsteroidal anti-inflammatory drugs (NSAIDs). These include: ? Ibuprofen. ? Naproxen. ? Celecoxib.  Vitamin supplements that have vitamin A, vitamin E, or both.  Hormone therapy that contains estrogen with or without progestin.  Get help right away if:  You have pain in your chest, neck, arm, jaw, stomach, or back that: ? Lasts more than a few minutes. ? Comes back. ? Does not get better after you take medicine under your tongue (sublingual nitroglycerin).  You have any of these symptoms for no reason: ? Gas, heartburn, or indigestion. ? Sweating a lot. ? Shortness of breath or trouble breathing. ? Feeling sick to your stomach or throwing up. ? Feeling more tired than usual. ? Feeling nervous or worrying more than usual. ? Feeling weak. ? Diarrhea.  You are suddenly dizzy or light-headed.  You faint or pass out. These symptoms may be an emergency. Do not wait to see if the symptoms will go away. Get medical help right away. Call your local emergency services (911 in the [place] Do not drive yourself to the hospital. This information is not intended to replace advice given to you by your health care provider. Make sure you discuss any questions you have with your health care provider. Document Released: 04/11/2008 Document Revised: 03/31/2016 Document Reviewed: 02/25/2014 Elsevier Interactive Patient Education  2017 [employer]  

## 2018-09-25 NOTE — Progress Notes (Signed)
Subjective:  Patient ID: [name], male    DOB: [birth year]  Age: 52 y.o. MRN: [id number]  CC:   HPI Deyon Chizek Sellarsis a 52 y.o.malewith a medical history of morbid obesity, OSA, HTN, bilateral LE edema, pulmonary hypertension, nephrolithiasis, left tib/fib fracture, and lipoma presents with bilateral leg pain and swelling. Says he has been going to Dr. Carrolyn Meiers, Orthopedics. Dr Carrolyn Meiers is monitoring the healing from ORIF. Pt reportedly told he has arthritis of the knees. Has not received injections or spoken about surgery. No imaging or notes from Dr. Kandee Keen office available for review. Says he regularly experiences bilateral knee pain and leg pain. There is also associated swelling of the lower legs. Has been taking HCTZ and Lasix as directed. BP 120/79 mmHg and HR 92 bpm today. Endorses chest tightness and possible palpitations. Says he was told by pulmonologist that he had an irregular heart beat. Has felt tightness without physical activity. Has difficulty breathing when laying supine. Has to sleep inclined. Does not endorse any other symptoms or complaints.    Outpatient Medications Prior to Visit  Medication Sig Dispense Refill  . acetaminophen-codeine (TYLENOL #3) 300-30 MG tablet Take 1 tablet by mouth every 4 (four) hours as needed.   0  . furosemide (LASIX) 40 MG tablet TAKE 1 TABLET BY MOUTH 2 TIMES DAILY. 60 tablet 2  . hydrochlorothiazide (HYDRODIURIL) 25 MG tablet Take 1 tablet (25 mg total) by mouth daily. Take on tablet in the morning. 90 tablet 1  . oxyCODONE-acetaminophen (PERCOCET/ROXICET) 5-325 MG tablet Take 1 tablet by mouth every 6 (six) hours as needed for severe pain.     No facility-administered medications prior to visit.      ROS Review of Systems  Constitutional: Negative for chills, fever and malaise/fatigue.  Eyes: Negative for blurred vision.  Respiratory: Negative for shortness of breath.   Cardiovascular: Positive for palpitations. Negative for chest  pain.       Chest tightness  Gastrointestinal: Negative for abdominal pain and nausea.  Genitourinary: Negative for dysuria and hematuria.  Musculoskeletal: Negative for joint pain and myalgias.       Bilateral knee and leg pain  Skin: Negative for rash.  Neurological: Negative for tingling and headaches.  Psychiatric/Behavioral: Negative for depression. The patient is not nervous/anxious.     Objective:  BP 120/79 (BP Location: Left Arm, Patient Position: Sitting, Cuff Size: Large)   Pulse 92   Temp 97.8 F (36.6 C) (Oral)   Ht 5\' 8"  (1.727 m)   Wt (!) 411 lb 12.8 oz (186.8 kg)   SpO2 93%   BMI 62.61 kg/m   BP/Weight 09/25/2018 07/16/2018 7/34/2876  Systolic BP 811 572 620  Diastolic BP 79 86 88  Wt. (Lbs) 411.8 409 404  BMI 62.61 62.19 61.43      Physical Exam  Constitutional: He is oriented to person, place, and time.  Well developed, morbidly obese, NAD, polite, using cane for ambulation.  HENT:  Head: Normocephalic and atraumatic.  Eyes: No scleral icterus.  Neck: Normal range of motion. Neck supple. No thyromegaly present.  Cardiovascular: Normal rate, regular rhythm and normal heart sounds. Exam reveals no gallop and no friction rub.  No murmur heard. Trace pitting edema of the LE bilaterally  Pulmonary/Chest: Effort normal and breath sounds normal.  Musculoskeletal: He exhibits no edema.  Neurological: He is alert and oriented to person, place, and time.  Skin: Skin is warm and dry. No rash noted. No erythema. No pallor.  Psychiatric: He has a normal mood and affect. His behavior is normal. Thought content normal.  Vitals reviewed.    Assessment & Plan:    1. Bilateral lower extremity edema - Compression stockings - Refill furosemide (LASIX) 40 MG tablet; Take 1 tablet (40 mg total) by mouth 2 (two) times daily.  Dispense: 60 tablet; Refill: 5 - BMP  2. Arthralgia of both lower legs - Advised patient to consult his orthopedic specialist about his ORIF  progress and to discuss the "arthritis" of his knees.   3. Morbid obesity ([place]) - Amb Referral to Bariatric Surgery - Lipid panel; Future  4. Administrative encounter - Temporary six month handicap placard authorized.  5. Hypertension, unspecified type - Refill hydrochlorothiazide (HYDRODIURIL) 25 MG tablet; Take 1 tablet (25 mg total) by mouth daily. Take on tablet in the morning.  Dispense: 30 tablet; Refill: 5 - Refill furosemide (LASIX) 40 MG tablet; Take 1 tablet (40 mg total) by mouth 2 (two) times daily.  Dispense: 60 tablet; Refill: 5 - Begin metoprolol succinate (TOPROL-XL) 50 MG 24 hr tablet; Take 1 tablet (50 mg total) by mouth daily. Take with or immediately following a meal.  Dispense: 30 tablet; Refill: 3 - Begin aspirin EC 81 MG tablet; Take 1 tablet (81 mg total) by mouth daily.  Dispense: 90 tablet; Refill: 3 - BMP - Lipid panel  6. Hyperglycemia - HgB A1c  7. Prediabetes - A1c 5.9% today.  8. Chest tightness - EKG 12-Lead with T wave inversion of V1 and V2. Flattening of T waves in the rest of precordial leads.  - Begin nitroGLYCERIN (NITROSTAT) 0.4 MG SL tablet; Place 1 tablet (0.4 mg total) under the tongue every 5 (five) minutes as needed for chest pain.  Dispense: 25 tablet; Refill: 1. I have personally explained how to take nitro. He knows to call ambulance should he reach his third nitro tab.  - Ambulatory referral to Cardiology - TSH - CBC with Differential - Lipid panel; Future  9. History of palpitations - Begin metoprolol succinate (TOPROL-XL) 50 MG 24 hr tablet; Take 1 tablet (50 mg total) by mouth daily. Take with or immediately following a meal.  Dispense: 30 tablet; Refill: 3 - Begin nitroGLYCERIN (NITROSTAT) 0.4 MG SL tablet; Place 1 tablet (0.4 mg total) under the tongue every 5 (five) minutes as needed for chest pain.  Dispense: 25 tablet; Refill: 1 - Ambulatory referral to Cardiology - EKG 12-Lead - TSH - CBC with Differential   Meds ordered  this encounter  Medications  . hydrochlorothiazide (HYDRODIURIL) 25 MG tablet    Sig: Take 1 tablet (25 mg total) by mouth daily. Take on tablet in the morning.    Dispense:  30 tablet    Refill:  5    Order Specific Question:   Supervising Provider    Answer:   Charlott Rakes [[id number]]  . furosemide (LASIX) 40 MG tablet    Sig: Take 1 tablet (40 mg total) by mouth 2 (two) times daily.    Dispense:  60 tablet    Refill:  5    Order Specific Question:   Supervising Provider    Answer:   Charlott Rakes [[id number]]  . metoprolol succinate (TOPROL-XL) 50 MG 24 hr tablet    Sig: Take 1 tablet (50 mg total) by mouth daily. Take with or immediately following a meal.    Dispense:  30 tablet    Refill:  3    Order Specific Question:   Supervising Provider  Answer:   Charlott Rakes [[id number]]  . aspirin EC 81 MG tablet    Sig: Take 1 tablet (81 mg total) by mouth daily.    Dispense:  90 tablet    Refill:  3    Order Specific Question:   Supervising Provider    Answer:   Charlott Rakes [[id number]]  . nitroGLYCERIN (NITROSTAT) 0.4 MG SL tablet    Sig: Place 1 tablet (0.4 mg total) under the tongue every 5 (five) minutes as needed for chest pain.    Dispense:  25 tablet    Refill:  1    Order Specific Question:   Supervising Provider    Answer:   Charlott Rakes [[id number]]    Follow-up: No follow-ups on file.   Clent Demark PA       ]

## 2018-09-26 ENCOUNTER — Other Ambulatory Visit (INDEPENDENT_AMBULATORY_CARE_PROVIDER_SITE_OTHER): Payer: Self-pay | Admitting: Physician Assistant

## 2018-09-26 ENCOUNTER — Telehealth (INDEPENDENT_AMBULATORY_CARE_PROVIDER_SITE_OTHER): Payer: Self-pay

## 2018-09-26 DIAGNOSIS — D649 Anemia, unspecified: Secondary | ICD-10-CM

## 2018-09-26 DIAGNOSIS — E876 Hypokalemia: Secondary | ICD-10-CM

## 2018-09-26 LAB — BASIC METABOLIC PANEL
BUN/Creatinine Ratio: 17 (ref 9–20)
BUN: 21 mg/dL (ref 6–24)
CALCIUM: 9.3 mg/dL (ref 8.7–10.2)
CO2: 28 mmol/L (ref 20–29)
Chloride: 98 mmol/L (ref 96–106)
Creatinine, Ser: 1.22 mg/dL (ref 0.76–1.27)
GFR, EST AFRICAN AMERICAN: 78 mL/min/{1.73_m2} (ref >59)
GFR, EST NON AFRICAN AMERICAN: 67 mL/min/{1.73_m2} (ref >59)
Glucose: 96 mg/dL (ref 65–99)
Potassium: 3.1 mmol/L — ABNORMAL LOW (ref 3.5–5.2)
SODIUM: 140 mmol/L (ref 134–144)

## 2018-09-26 LAB — CBC WITH DIFFERENTIAL/PLATELET
BASOS ABS: 0 10*3/uL (ref 0.0–0.2)
Basos: 1 %
EOS (ABSOLUTE): 0.1 10*3/uL (ref 0.0–0.4)
Eos: 1 %
HEMOGLOBIN: 12.6 g/dL — AB (ref 13.0–17.7)
Hematocrit: 40.2 % (ref 37.5–51.0)
Immature Grans (Abs): 0 10*3/uL (ref 0.0–0.1)
Immature Granulocytes: 0 %
LYMPHS: 27 %
Lymphocytes Absolute: 1.6 10*3/uL (ref 0.7–3.1)
MCH: 22.9 pg — AB (ref 26.6–33.0)
MCHC: 31.3 g/dL — AB (ref 31.5–35.7)
MCV: 73 fL — ABNORMAL LOW (ref 79–97)
MONOCYTES: 12 %
Monocytes Absolute: 0.7 10*3/uL (ref 0.1–0.9)
NEUTROS ABS: 3.4 10*3/uL (ref 1.4–7.0)
NEUTROS PCT: 59 %
Platelets: 467 10*3/uL — ABNORMAL HIGH (ref 150–450)
RBC: 5.5 x10E6/uL (ref 4.14–5.80)
RDW: 16.1 % — ABNORMAL HIGH (ref 12.3–15.4)
WBC: 5.8 10*3/uL (ref 3.4–10.8)

## 2018-09-26 LAB — TSH: TSH: 0.594 u[IU]/mL (ref 0.450–4.500)

## 2018-09-26 MED ORDER — POTASSIUM CHLORIDE CRYS ER 20 MEQ PO TBCR: 20.0000 meq | EXTENDED_RELEASE_TABLET | Freq: Every day | ORAL | 1 refills | Status: DC

## 2018-09-26 MED ORDER — FERROUS SULFATE 325 (65 FE) MG PO TBEC: 325.0000 mg | DELAYED_RELEASE_TABLET | Freq: Two times a day (BID) | ORAL | 0 refills | Status: DC

## 2018-09-26 MED FILL — NITROGLYCERIN 0.4 MG TAB SL: 0.4 | 25 days supply | Qty: 25 | Fill #0

## 2018-09-26 MED FILL — METOPROLOL SUCCINATE ER 50: 50 | 30 days supply | Qty: 30 | Fill #0

## 2018-09-26 MED FILL — HYDROCHLOROTHIAZIDE 25 MG T: 25 | 30 days supply | Qty: 30 | Fill #0

## 2018-09-26 MED FILL — FUROSEMIDE 40 MG TAB: 40 | 30 days supply | Qty: 60 | Fill #0

## 2018-09-26 NOTE — Telephone Encounter (Signed)
Patient is aware that potassium is low and potassium pills have been sent to [facility]. He is also aware that his anemia is improving but its recommended that he takes iron pills for one month. Iron pills have also been to [facility]. Nat Christen, CMA

## 2018-09-26 NOTE — Telephone Encounter (Signed)
-----   Message from Clent Demark, PA-C sent at 09/26/2018  1:40 PM EST ----- Potassium is low. I have sent potassium pills to [facility]. Anemia is improving but I recommend taking iron pills for one month. I sent iron pills to [facility].

## 2018-10-08 ENCOUNTER — Other Ambulatory Visit (INDEPENDENT_AMBULATORY_CARE_PROVIDER_SITE_OTHER): Payer: Medicare Other

## 2018-10-08 DIAGNOSIS — R0789 Other chest pain: Secondary | ICD-10-CM

## 2018-10-08 DIAGNOSIS — I10 Essential (primary) hypertension: Secondary | ICD-10-CM

## 2018-10-08 NOTE — Progress Notes (Signed)
Labs collected by onsite phlebotomist. Nat Christen, [place]

## 2018-10-09 ENCOUNTER — Telehealth (INDEPENDENT_AMBULATORY_CARE_PROVIDER_SITE_OTHER): Payer: Self-pay

## 2018-10-09 ENCOUNTER — Other Ambulatory Visit (INDEPENDENT_AMBULATORY_CARE_PROVIDER_SITE_OTHER): Payer: Self-pay | Admitting: Physician Assistant

## 2018-10-09 LAB — LIPID PANEL
Chol/HDL Ratio: 2.7 ratio (ref 0.0–5.0)
Cholesterol, Total: 142 mg/dL (ref 100–199)
HDL: 52 mg/dL (ref >39)
LDL Calculated: 81 mg/dL (ref 0–99)
Triglycerides: 45 mg/dL (ref 0–149)
VLDL CHOLESTEROL CAL: 9 mg/dL (ref 5–40)

## 2018-10-09 MED ORDER — PRAVASTATIN SODIUM 20 MG PO TABS: 20.0000 mg | ORAL_TABLET | Freq: Every day | ORAL | 5 refills | Status: DC

## 2018-10-09 MED FILL — PRAVASTATIN SODIUM 20 MG TA: 20 | 30 days supply | Qty: 30 | Fill #0

## 2018-10-09 NOTE — Telephone Encounter (Signed)
-----   Message from Clent Demark, PA-C sent at 10/09/2018 11:32 AM EST ----- Bad cholesterol should optimally be below 70. He is at 74. I have sent pravastatin to [facility].

## 2018-10-09 NOTE — Telephone Encounter (Signed)
Patient is aware that bad cholesterol should optimally be below 70 and he is currently at 81. Pravastatin has been sent to [facility]. Nat Christen, CMA

## 2018-10-23 ENCOUNTER — Ambulatory Visit (INDEPENDENT_AMBULATORY_CARE_PROVIDER_SITE_OTHER): Payer: Medicare Other | Admitting: Physician Assistant

## 2018-10-23 ENCOUNTER — Encounter (INDEPENDENT_AMBULATORY_CARE_PROVIDER_SITE_OTHER): Payer: Self-pay | Admitting: Physician Assistant

## 2018-10-23 ENCOUNTER — Other Ambulatory Visit: Payer: Self-pay

## 2018-10-23 VITALS — BP 117/75 | HR 79 | Temp 98.0°F | Ht 68.0 in | Wt >= 6400 oz

## 2018-10-23 DIAGNOSIS — R0789 Other chest pain: Secondary | ICD-10-CM

## 2018-10-23 DIAGNOSIS — G8929 Other chronic pain: Secondary | ICD-10-CM

## 2018-10-23 DIAGNOSIS — K5900 Constipation, unspecified: Secondary | ICD-10-CM | POA: Diagnosis not present

## 2018-10-23 DIAGNOSIS — M25562 Pain in left knee: Secondary | ICD-10-CM

## 2018-10-23 DIAGNOSIS — M25561 Pain in right knee: Secondary | ICD-10-CM | POA: Diagnosis not present

## 2018-10-23 DIAGNOSIS — E876 Hypokalemia: Secondary | ICD-10-CM | POA: Diagnosis not present

## 2018-10-23 MED ORDER — ACETAMINOPHEN-CODEINE #3 300-30 MG PO TABS: 1.0000 | ORAL_TABLET | Freq: Three times a day (TID) | ORAL | 0 refills | Status: AC | PRN

## 2018-10-23 MED ORDER — NAPROXEN 500 MG PO TABS: 500.0000 mg | ORAL_TABLET | Freq: Two times a day (BID) | ORAL | 0 refills | Status: DC

## 2018-10-23 MED ORDER — LINACLOTIDE 72 MCG PO CAPS: 72.0000 ug | ORAL_CAPSULE | Freq: Every day | ORAL | 2 refills | Status: DC

## 2018-10-23 MED FILL — NAPROXEN 500 MG TABLET: 500 | 7 days supply | Qty: 14 | Fill #0

## 2018-10-23 NOTE — Progress Notes (Signed)
Subjective:  Patient ID: Tyler Walters, adult    DOB: November 03, 1966  Age: 52 y.o. MRN: [id number]  CC: f/u chest tightness  HPI Tyler Tedesco Sellarsis a 52 y.o.malewith a medical history of morbid obesity, OSA, HTN, bilateral LE edema, pulmonary hypertension, nephrolithiasis, left tib/fib fracture, and lipoma presents to f/u on chest tightness. Last seen one month ago. Had EKG done which revealed T wave inversion of V1 and V2. Flattening of T waves in the rest of precordial leads. He was prescribed nitro in case of chest pain and referred to cardiology. Labs revealed LDL 81 mg/dL and advised to begin statin to optimally control below 70 mg/dL. Potassium was low at 3.1 mmol/L and subsequently prescribed K-Dur. TSH normal. Has not heard from Cardiology yet.    In regards to HTN. Last BP 120/79 mmHg and HR 92 bpm. Prescribed Metoprolol succinate 50 mg and Aspirin 81 mg. Advised to continue HCTZ 25 mg and Furosemide 40 mg. Pt taking all medications as prescribed and feeling well. Only complaint is of chronic knee pain which is managed by ortho and of chronic constipation. Constipation since young adulthood. Usually 3-5 days without bowel movement but has been up to eight days without BM. Takes OTC stool softeners and laxatives as needed with good effect in relieving constipation.        Outpatient Medications Prior to Visit  Medication Sig Dispense Refill  . aspirin EC 81 MG tablet Take 1 tablet (81 mg total) by mouth daily. 90 tablet 3  . ferrous sulfate 325 (65 FE) MG EC tablet Take 1 tablet (325 mg total) by mouth 2 (two) times daily. 60 tablet 0  . furosemide (LASIX) 40 MG tablet Take 1 tablet (40 mg total) by mouth 2 (two) times daily. 60 tablet 5  . hydrochlorothiazide (HYDRODIURIL) 25 MG tablet Take 1 tablet (25 mg total) by mouth daily. Take on tablet in the morning. 30 tablet 5  . metoprolol succinate (TOPROL-XL) 50 MG 24 hr tablet Take 1 tablet (50 mg total) by mouth daily. Take with or  immediately following a meal. 30 tablet 3  . potassium chloride SA (K-DUR,KLOR-CON) 20 MEQ tablet Take 1 tablet (20 mEq total) by mouth daily. 30 tablet 1  . pravastatin (PRAVACHOL) 20 MG tablet Take 1 tablet (20 mg total) by mouth daily. 30 tablet 5  . nitroGLYCERIN (NITROSTAT) 0.4 MG SL tablet Place 1 tablet (0.4 mg total) under the tongue every 5 (five) minutes as needed for chest pain. (Patient not taking: Reported on 10/23/2018) 25 tablet 1   No facility-administered medications prior to visit.      ROS Review of Systems  Constitutional: Negative for chills, fever and malaise/fatigue.  Eyes: Negative for blurred vision.  Respiratory: Negative for shortness of breath.   Cardiovascular: Negative for chest pain and palpitations.  Gastrointestinal: Positive for constipation. Negative for abdominal pain and nausea.  Genitourinary: Negative for dysuria and hematuria.  Musculoskeletal: Positive for joint pain. Negative for myalgias.  Skin: Negative for rash.  Neurological: Negative for tingling and headaches.  Psychiatric/Behavioral: Negative for depression. The patient is not nervous/anxious.     Objective:  BP 117/75 (BP Location: Left Arm, Patient Position: Sitting, Cuff Size: Large)   Pulse 79   Temp 98 F (36.7 C) (Oral)   Ht 5\' 8"  (1.727 m)   Wt (!) 413 lb 3.2 oz (187.4 kg)   SpO2 93%   BMI 62.83 kg/m   BP/Weight 10/23/2018 40/34/7425 07/12/6386  Systolic BP 564 332  244  Diastolic BP 75 79 86  Wt. (Lbs) 413.2 411.8 409  BMI 62.83 62.61 62.19      Physical Exam Vitals signs reviewed.  Constitutional:      Comments: Well developed, morbidly obese, NAD, polite  HENT:     Head: Normocephalic and atraumatic.  Eyes:     General: No scleral icterus. Neck:     Musculoskeletal: Normal range of motion and neck supple.     Thyroid: No thyromegaly.  Cardiovascular:     Rate and Rhythm: Normal rate and regular rhythm.     Heart sounds: Normal heart sounds.  Pulmonary:      Effort: Pulmonary effort is normal.     Breath sounds: Normal breath sounds.  Skin:    General: Skin is warm and dry.     Coloration: Skin is not pale.     Findings: No erythema or rash.  Neurological:     Mental Status: She is alert and oriented to person, place, and time.  Psychiatric:        Mood and Affect: Mood normal.        Behavior: Behavior normal.        Thought Content: Thought content normal.      Assessment & Plan:    1. Chest tightness - Has not had an episode since last visit. Seems addition of Metoprolol has helped. We have obtained an appointment at cardiology during this encounter. Appointment details provided to patient.   2. Hypokalemia - Likely diuretic induced. Will reassess K level and adjust diuretics as appropriate.  - Basic Metabolic Panel  3. Chronic pain of both knees - Pt advised to continue management with his orthopedic specialist.  - naproxen (NAPROSYN) 500 MG tablet; Take 1 tablet (500 mg total) by mouth 2 (two) times daily with a meal.  Dispense: 14 tablet; Refill: 0  4. Constipation, unspecified constipation type - Begin linaclotide (LINZESS) 72 MCG capsule; Take 1 capsule (72 mcg total) by mouth daily before breakfast.  Dispense: 30 capsule; Refill: 2   Meds ordered this encounter  Medications  . acetaminophen-codeine (TYLENOL #3) 300-30 MG tablet    Sig: Take 1 tablet by mouth every 8 (eight) hours as needed for up to 7 days for moderate pain.    Dispense:  21 tablet    Refill:  0    Order Specific Question:   Supervising Provider    Answer:   Charlott Rakes [[id number]]  . naproxen (NAPROSYN) 500 MG tablet    Sig: Take 1 tablet (500 mg total) by mouth 2 (two) times daily with a meal.    Dispense:  14 tablet    Refill:  0    Order Specific Question:   Supervising Provider    Answer:   Charlott Rakes [[id number]]  . linaclotide (LINZESS) 72 MCG capsule    Sig: Take 1 capsule (72 mcg total) by mouth daily before breakfast.    Dispense:  30  capsule    Refill:  2    Order Specific Question:   Supervising Provider    Answer:   Charlott Rakes [[id number]]    Follow-up: Return in about 4 weeks (around 11/20/2018) for Constipation and f/u cardiology.   Clent Demark PA

## 2018-10-23 NOTE — Patient Instructions (Signed)

## 2018-10-24 ENCOUNTER — Other Ambulatory Visit (INDEPENDENT_AMBULATORY_CARE_PROVIDER_SITE_OTHER): Payer: Self-pay | Admitting: Physician Assistant

## 2018-10-24 DIAGNOSIS — E876 Hypokalemia: Secondary | ICD-10-CM

## 2018-10-24 LAB — BASIC METABOLIC PANEL
BUN / CREAT RATIO: 15 (ref 9–20)
BUN: 18 mg/dL (ref 6–24)
CO2: 27 mmol/L (ref 20–29)
CREATININE: 1.18 mg/dL (ref 0.76–1.27)
Calcium: 9.5 mg/dL (ref 8.7–10.2)
Chloride: 99 mmol/L (ref 96–106)
GFR calc Af Amer: 81 mL/min/{1.73_m2} (ref >59)
GFR calc non Af Amer: 70 mL/min/{1.73_m2} (ref >59)
Glucose: 96 mg/dL (ref 65–99)
Potassium: 3.4 mmol/L — ABNORMAL LOW (ref 3.5–5.2)
Sodium: 144 mmol/L (ref 134–144)

## 2018-10-24 MED ORDER — SPIRONOLACTONE 25 MG PO TABS: 25.0000 mg | ORAL_TABLET | Freq: Every day | ORAL | 2 refills | Status: DC

## 2018-10-24 MED FILL — SPIRONOLACTONE 25 MG TABLET: 25 | 30 days supply | Qty: 30 | Fill #0

## 2018-10-25 ENCOUNTER — Telehealth (INDEPENDENT_AMBULATORY_CARE_PROVIDER_SITE_OTHER): Payer: Self-pay

## 2018-10-25 NOTE — Telephone Encounter (Signed)
-----   Message from Clent Demark, PA-C sent at 10/24/2018  9:24 AM EST ----- Potassium is slightly low. I have discontinued Furosemide and Potassium pills. He should now take spironolactone along with HCTZ and the rest of his anti-hypertensives.

## 2018-10-25 NOTE — Telephone Encounter (Signed)
Patient is aware that potassium is slightly low. Furosemide and potassium pills have been discontinued. Should now take spironolactone along with HCTZ and other anti- hypertensives. Nat Christen, CMA

## 2018-12-06 ENCOUNTER — Ambulatory Visit: Payer: Medicare Other | Admitting: Cardiovascular Disease

## 2018-12-06 ENCOUNTER — Encounter (INDEPENDENT_AMBULATORY_CARE_PROVIDER_SITE_OTHER): Payer: Self-pay

## 2018-12-06 ENCOUNTER — Encounter: Payer: Self-pay | Admitting: Cardiovascular Disease

## 2018-12-06 VITALS — BP 146/104 | HR 88 | Ht 68.0 in | Wt >= 6400 oz

## 2018-12-06 DIAGNOSIS — R0789 Other chest pain: Secondary | ICD-10-CM | POA: Diagnosis not present

## 2018-12-06 DIAGNOSIS — I10 Essential (primary) hypertension: Secondary | ICD-10-CM

## 2018-12-06 LAB — BASIC METABOLIC PANEL
BUN/Creatinine Ratio: 16 (ref 9–20)
BUN: 17 mg/dL (ref 6–24)
CHLORIDE: 100 mmol/L (ref 96–106)
CO2: 23 mmol/L (ref 20–29)
Calcium: 9.2 mg/dL (ref 8.7–10.2)
Creatinine, Ser: 1.04 mg/dL (ref 0.76–1.27)
GFR calc Af Amer: 94 mL/min/{1.73_m2} (ref >59)
GFR calc non Af Amer: 81 mL/min/{1.73_m2} (ref >59)
Glucose: 78 mg/dL (ref 65–99)
Potassium: 4.2 mmol/L (ref 3.5–5.2)
Sodium: 140 mmol/L (ref 134–144)

## 2018-12-06 MED ORDER — FUROSEMIDE 40 MG PO TABS: 40.0000 mg | ORAL_TABLET | Freq: Every day | ORAL | 11 refills | Status: DC

## 2018-12-06 MED ORDER — POTASSIUM CHLORIDE ER 10 MEQ PO TBCR: 10.0000 meq | EXTENDED_RELEASE_TABLET | Freq: Every day | ORAL | 11 refills | Status: AC

## 2018-12-06 MED FILL — FUROSEMIDE 40 MG TAB: 40 | 30 days supply | Qty: 30 | Fill #0

## 2018-12-06 MED FILL — POTASSIUM CHLORIDE ER 10 ME: 10 | 30 days supply | Qty: 30 | Fill #0

## 2018-12-06 NOTE — Progress Notes (Signed)
Cardiology Office Note:    Date:  12/06/2018   ID:  Tyler Walters, DOB [birth year], MRN [id number]  PCP:  Clent Demark, PA-C  Cardiologist:  [name], MD  Electrophysiologist:  None   Referring MD: Clent Demark, PA-C   Problem List 1. Morbid obesity 2. Anemia 3.   Obstructive sleep apnea  - unable to wear mask  4.    Chief Complaint  Patient presents with  . Chest Pain     History of Present Illness:    Tyler Walters is a 53 y.o. adult with a hx of  Morbid obesity, HTN, pulmonary HTN  who we are asked to see by Clent Demark, PA-C for episodes of chest pain .   Had episodes of CP ,  Associated with dyspnea Usually at rest.  With lying down  Has orthopnea - sleeps on 2 pillows.   Was hosptilized in March  Had a large abdominal lipoma removed.  Also had a left ankle fracture fixed.   He had the CP for several months after his hospitalziation .   Now has resolved.   The pain feels like a heaviness,   Last for several minutes.  Usually with sitting or lying down .  Does not occur with walkig  - waks with cane or walker  Is planning on doing water aeorbics We discussed his morbid obesity -   Was on HCTZ and lasix but these were stopped recently . He has noticed worsening leg edema  Works in [employer] .     Past Medical History:  Diagnosis Date  . Lipoma of abdominal wall    Large lipoma  . Secondary pulmonary arterial hypertension ([place]) 03/20/2018    Past Surgical History:  Procedure Laterality Date  . EXTERNAL FIXATION LEG Left 01/16/2018   Procedure: LEFT KNEE SPANNING EXTERNAL FIXATION LEG;  Surgeon: Leandrew Koyanagi, MD;  Location: [facility];  Service: Orthopedics;  Laterality: Left;  . EXTERNAL FIXATION REMOVAL Left 01/24/2018   Procedure: REMOVAL EXTERNAL FIXATION LEG;  Surgeon: Shona Needles, MD;  Location: [facility];  Service: Orthopedics;  Laterality: Left;  both upper and lower leg involved with fixator  . LIPOMA EXCISION Right 01/19/2018     Procedure: EXCISIONOF RIGHT CHEST WALL MASS, EXCISION OF ABDOMINAL MASS;  Surgeon: Donnie Mesa, MD;  Location: [facility];  Service: General;  Laterality: Right;  . ORIF TIBIA PLATEAU Left 01/24/2018   Procedure: OPEN REDUCTION INTERNAL FIXATION (ORIF) TIBIAL PLATEAU;  Surgeon: Shona Needles, MD;  Location: [facility];  Service: Orthopedics;  Laterality: Left;  . PANNICULECTOMY N/A 01/19/2018   Procedure: PANNICULECTOMY;  Surgeon: Donnie Mesa, MD;  Location: [facility];  Service: General;  Laterality: N/A;    Current Medications: Current Meds  Medication Sig  . aspirin EC 81 MG tablet Take 1 tablet (81 mg total) by mouth daily.  . ferrous sulfate 325 (65 FE) MG EC tablet Take 1 tablet (325 mg total) by mouth 2 (two) times daily.  . naproxen (NAPROSYN) 500 MG tablet Take 1 tablet (500 mg total) by mouth 2 (two) times daily with a meal.  . nitroGLYCERIN (NITROSTAT) 0.4 MG SL tablet Place 1 tablet (0.4 mg total) under the tongue every 5 (five) minutes as needed for chest pain.  Marland Kitchen spironolactone (ALDACTONE) 25 MG tablet Take 1 tablet (25 mg total) by mouth daily.     Allergies:   Morphine and related and Tape   Social History   Socioeconomic History  .  Marital status: Married    Spouse name: Not on file  . Number of children: Not on file  . Years of education: Not on file  . Highest education level: Not on file  Occupational History  . Not on file  Social Needs  . Financial resource strain: Not on file  . Food insecurity:    Worry: Not on file    Inability: Not on file  . Transportation needs:    Medical: Not on file    Non-medical: Not on file  Tobacco Use  . Smoking status: Never Smoker  . Smokeless tobacco: Never Used  Substance and Sexual Activity  . Alcohol use: No  . Drug use: No  . Sexual activity: Not on file  Lifestyle  . Physical activity:    Days per week: Not on file    Minutes per session: Not on file  . Stress: Not on file  Relationships  . Social connections:     Talks on phone: Not on file    Gets together: Not on file    Attends religious service: Not on file    Active member of club or organization: Not on file    Attends meetings of clubs or organizations: Not on file    Relationship status: Not on file  Other Topics Concern  . Not on file  Social History Narrative   Plans discharge home, get some type of health insurance      Family History: The patient's family history includes Diabetes in her father and mother.  ROS:   Please see the history of present illness.     All other systems reviewed and are negative.  EKGs/Labs/Other Studies Reviewed:    The following studies were reviewed today:    EKG:   Jan. 30, 2020:    NSR,   Inf. Q waves.   Recent Labs: 01/27/2018: ALT 14 02/14/2018: B Natriuretic Peptide 16.3 09/25/2018: Hemoglobin 12.6; Platelets 467; TSH 0.594 10/23/2018: BUN 18; Creatinine, Ser 1.18; Potassium 3.4; Sodium 144  Recent Lipid Panel    Component Value Date/Time   CHOL 142 10/08/2018 0900   TRIG 45 10/08/2018 0900   HDL 52 10/08/2018 0900   CHOLHDL 2.7 10/08/2018 0900   CHOLHDL 4.5 06/14/2009 1420   VLDL 11 06/14/2009 1420   LDLCALC 81 10/08/2018 0900    Physical Exam:    VS:  BP (!) 146/104   Pulse 88   Ht 5\' 8"  (1.727 m)   Wt (!) 427 lb 12.8 oz (194 kg)   SpO2 93%   BMI 65.05 kg/m     Wt Readings from Last 3 Encounters:  12/06/18 (!) 427 lb 12.8 oz (194 kg)  10/23/18 (!) 413 lb 3.2 oz (187.4 kg)  09/25/18 (!) 411 lb 12.8 oz (186.8 kg)     GEN:  Morbidly obese ,  NAD  HEENT: Normal NECK: No JVD; No carotid bruits LYMPHATICS: No lymphadenopathy CARDIAC:   RR , distant HR , no significant murmur  RESPIRATORY:  Clear to auscultation without rales, wheezing or rhonchi  ABDOMEN: morbidly obese.   Panniculus with edema .   MUSCULOSKELETAL:  No edema; No deformity  SKIN: Warm and dry NEUROLOGIC:  Alert and oriented x 3 PSYCHIATRIC:  Normal affect   ECG :   NSR at 88.   Inf. Q waves .  Right  axis deviation   ASSESSMENT:    1. Chest pain, atypical   2. Morbid obesity ([place])   3. Essential hypertension  PLAN:    In order of problems listed above:  1. Chest discomfort:  Ziah presents with atypical episodes of chest discomfort.  These occurred approximately 6 months ago and she is not had any further episodes of discomfort recently.  At this point were very limited in what we would be able to do.  Her weight is 427  pounds.  We would not get good results with a nuclear study or CT angio.   Fortunately, none of her symptoms are consistent with angina.  Ive Told her that far away her most important issue is the morbid obesity.  She definitely needs to lose weight.  2.  Anemia: She had stool studies which were negative.  I suggested that she might have beta thalassemia or beta thalassemia trait.  She will check in with her medical doctor for further evaluation.  She does not have a history of family history of sickle cell disease.  3.  Hypertension: Blood pressure still mildly elevated.  Will start Lasix 40 mg a day as well as potassium chloride 10 mEq a day.  We will check a basic metabolic profile today and again in 2 weeks.  Continue spironolactone. Weight loss will definitely help with the hypertension. Reviewed healthy diet.     Medication Adjustments/Labs and Tests Ordered: Current medicines are reviewed at length with the patient today.  Concerns regarding medicines are outlined above.  Orders Placed This Encounter  Procedures  . Basic Metabolic Panel (BMET)  . Basic Metabolic Panel (BMET)  . Amb ref to Medical Nutrition Therapy-MNT  . EKG 12-Lead   Meds ordered this encounter  Medications  . furosemide (LASIX) 40 MG tablet    Sig: Take 1 tablet (40 mg total) by mouth daily.    Dispense:  30 tablet    Refill:  11  . potassium chloride (K-DUR) 10 MEQ tablet    Sig: Take 1 tablet (10 mEq total) by mouth daily.    Dispense:  30 tablet    Refill:  11    Patient  Instructions  Medication Instructions:  Your physician has recommended you make the following change in your medication:  START Furosemide (Lasix) 40 mg once daily START Kdur (potassium chloride) 10 mEq once daily   If you need a refill on your cardiac medications before your next appointment, please call your pharmacy.    Lab work: TODAY - basic metabolic panel  Your physician recommends that you return for lab work on Thursday Feb. 13 for repeat basic metabolic panel  I Testing/Procedures: None Ordered    Follow-Up: You have been referred to [facility]. They will call you to schedule an appointment.   At [facility], you and your health needs are our priority.  As part of our continuing mission to provide you with exceptional heart care, we have created designated Provider Care Teams.  These Care Teams include your primary Cardiologist (physician) and Advanced Practice Providers (APPs -  Physician Assistants and Nurse Practitioners) who all work together to provide you with the care you need, when you need it. You will need a follow up appointment in:  6 months.  Please call our office 2 months in advance to schedule this appointment.  You may see [name], MD or one of the following Advanced Practice Providers on your designated Care Team: Richardson Dopp, PA-C [place], [place] . Daune Perch, NP      Signed, [name], MD  12/06/2018 9:32 AM  [facility]

## 2018-12-06 NOTE — Patient Instructions (Addendum)
Medication Instructions:  Your physician has recommended you make the following change in your medication:  START Furosemide (Lasix) 40 mg once daily START Kdur (potassium chloride) 10 mEq once daily   If you need a refill on your cardiac medications before your next appointment, please call your pharmacy.    Lab work: TODAY - basic metabolic panel  Your physician recommends that you return for lab work on Thursday Feb. 13 for repeat basic metabolic panel  I Testing/Procedures: None Ordered    Follow-Up: You have been referred to [facility]. They will call you to schedule an appointment.   At [facility], you and your health needs are our priority.  As part of our continuing mission to provide you with exceptional heart care, we have created designated Provider Care Teams.  These Care Teams include your primary Cardiologist (physician) and Advanced Practice Providers (APPs -  Physician Assistants and Nurse Practitioners) who all work together to provide you with the care you need, when you need it. You will need a follow up appointment in:  6 months.  Please call our office 2 months in advance to schedule this appointment.  You may see [name], MD or one of the following Advanced Practice Providers on your designated Care Team: Richardson Dopp, PA-C [place], [place] . Daune Perch, NP

## 2018-12-12 MED FILL — PRAVASTATIN SODIUM 20 MG TA: 20 | 30 days supply | Qty: 30 | Fill #0

## 2018-12-12 MED FILL — FERROUS SULFATE 325 MG TAB: 325 (65 FE) | 30 days supply | Qty: 60 | Fill #0

## 2018-12-20 ENCOUNTER — Other Ambulatory Visit: Payer: Medicare Other | Admitting: *Deleted

## 2018-12-20 DIAGNOSIS — R0789 Other chest pain: Secondary | ICD-10-CM

## 2018-12-20 DIAGNOSIS — I10 Essential (primary) hypertension: Secondary | ICD-10-CM

## 2018-12-20 LAB — BASIC METABOLIC PANEL
BUN/Creatinine Ratio: 19 (ref 9–20)
BUN: 21 mg/dL (ref 6–24)
CO2: 22 mmol/L (ref 20–29)
Calcium: 9.1 mg/dL (ref 8.7–10.2)
Chloride: 103 mmol/L (ref 96–106)
Creatinine, Ser: 1.11 mg/dL (ref 0.76–1.27)
GFR calc Af Amer: 87 mL/min/{1.73_m2} (ref >59)
GFR calc non Af Amer: 75 mL/min/{1.73_m2} (ref >59)
Glucose: 82 mg/dL (ref 65–99)
Potassium: 4 mmol/L (ref 3.5–5.2)
Sodium: 141 mmol/L (ref 134–144)

## 2019-01-29 MED FILL — PRAVASTATIN SODIUM 20 MG TA: 20 | 90 days supply | Qty: 90 | Fill #1

## 2019-01-29 MED FILL — POTASSIUM CHLORIDE ER 10 ME: 10 | 90 days supply | Qty: 90 | Fill #1

## 2019-01-29 MED FILL — FUROSEMIDE 40 MG TAB: 40 | 90 days supply | Qty: 90 | Fill #1

## 2019-03-31 IMAGING — CT CT KNEE*L* W/O CM
3 of 4 series · 13 of 35 positions shown, 14 images · non-contrast
Comparison: Radiographs from 9430 hours

ADDENDUM:
Comminuted proximal diaphyseal fracture of the fibula is also
present with slight medial displacement of the main fracture
fragment. The fibular head and neck are spared.
CLINICAL DATA: Patient fell backwards off a ladder 3 feet. Acute
left knee pain.

EXAM:
CT OF THE LEFT KNEE WITHOUT CONTRAST
TECHNIQUE: Multidetector CT imaging of the LEFT knee was performed according to
the standard protocol. Multiplanar CT image reconstructions were
also generated.

[Series 4: axial st · axial · 0.51mm/px · z∈[-146,+30]mm · 4 of 171 slices shown, 5 images]
[im 27/171  soft-tissue]
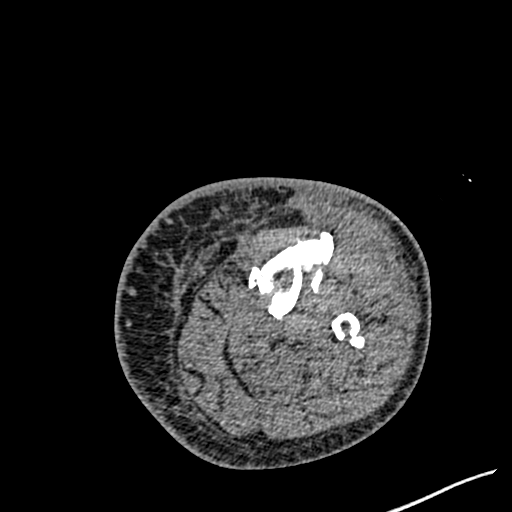
[im 27/171  bone]
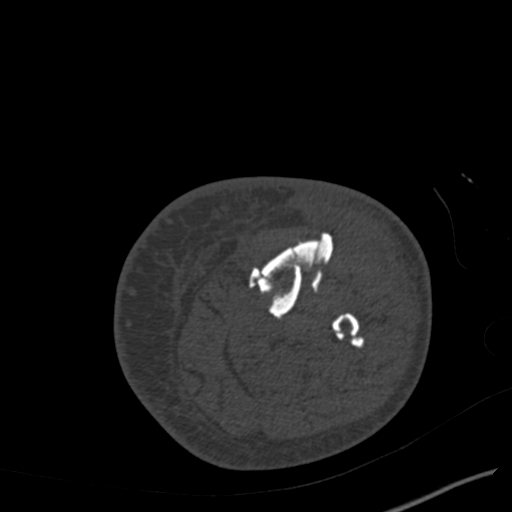
[im 66/171  bone]
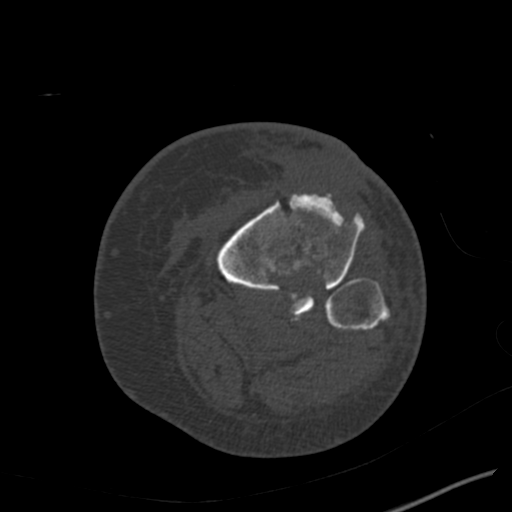
[im 105/171  bone]
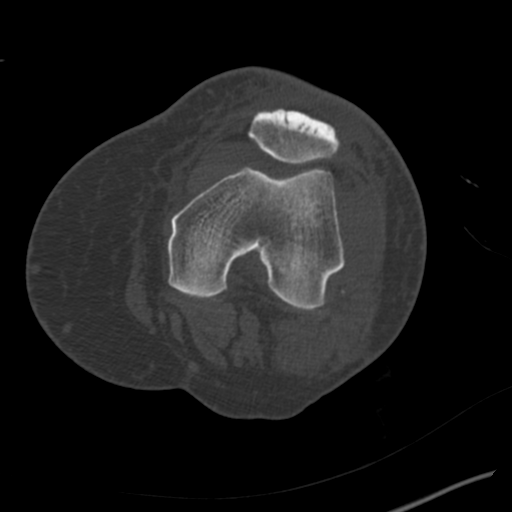
[im 144/171  bone]
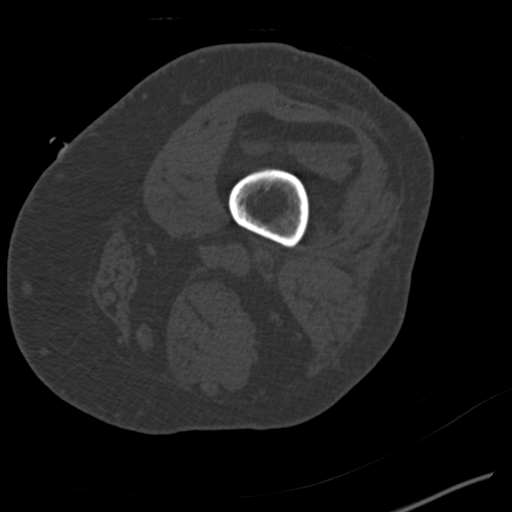

[Series 7: coronal bone · coronal · 0.37mm/px · 3 of 137 slices shown]
[im 28/137  bone]
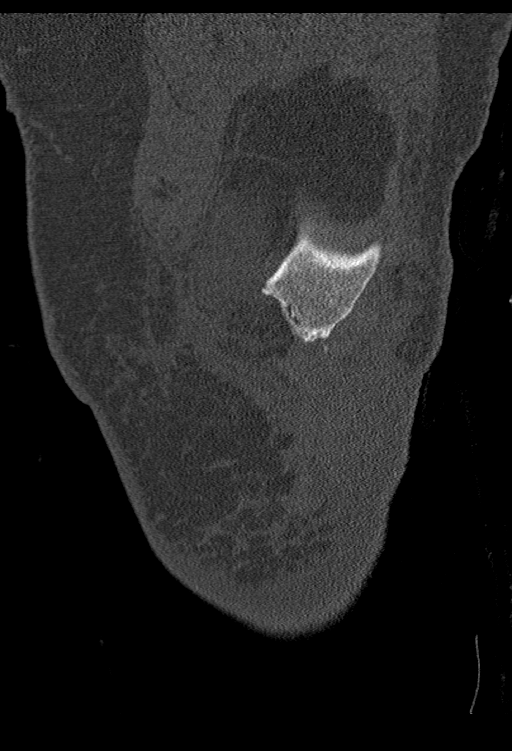
[im 55/137  bone]
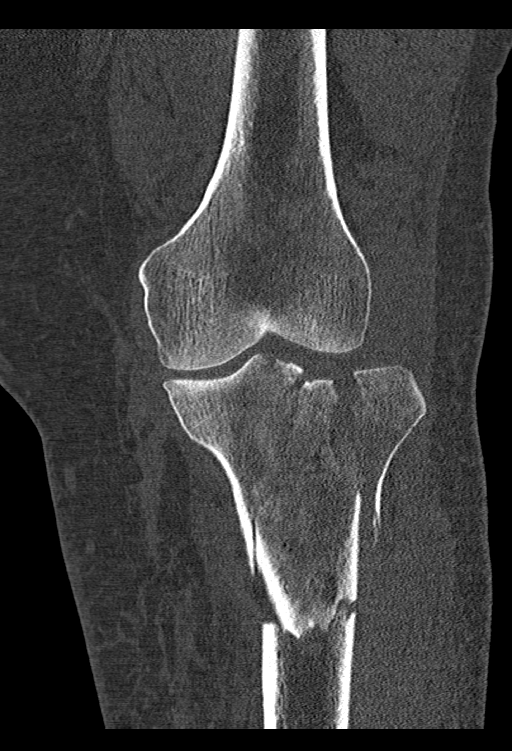
[im 82/137  bone]
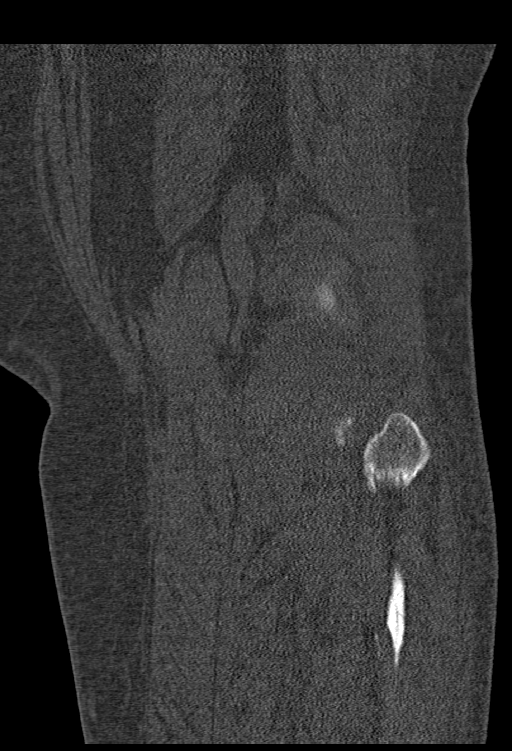

[Series 9: coronal st · sagittal · 0.42mm/px · 6 of 129 slices shown]
[im 11/129  soft-tissue]
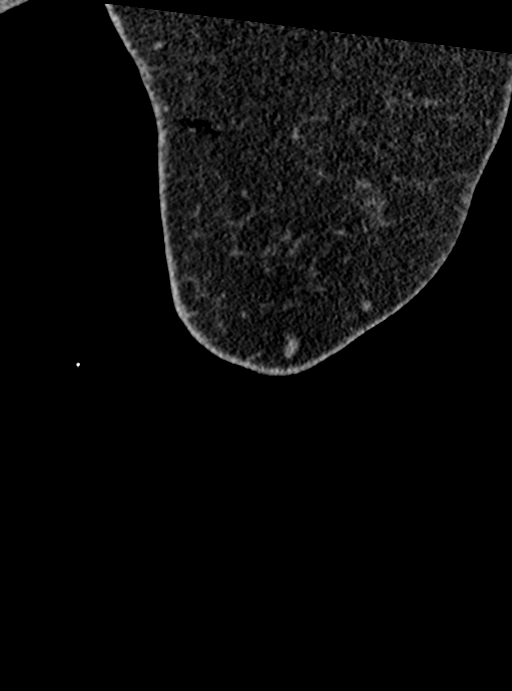
[im 22/129  bone]
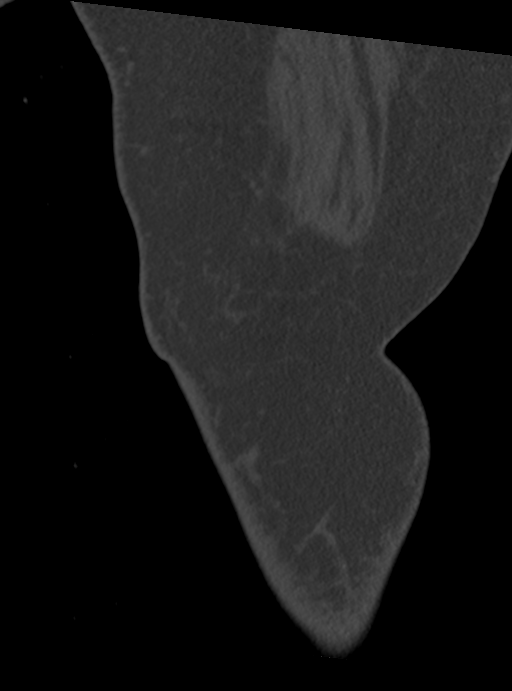
[im 43/129  bone]
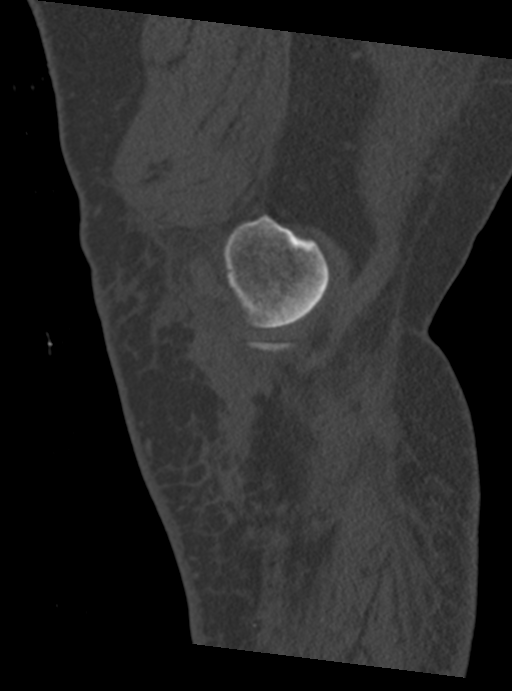
[im 65/129  bone]
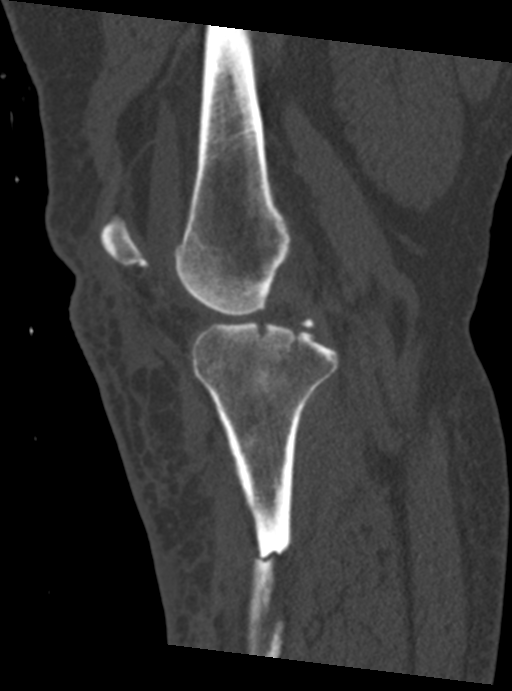
[im 86/129  bone]
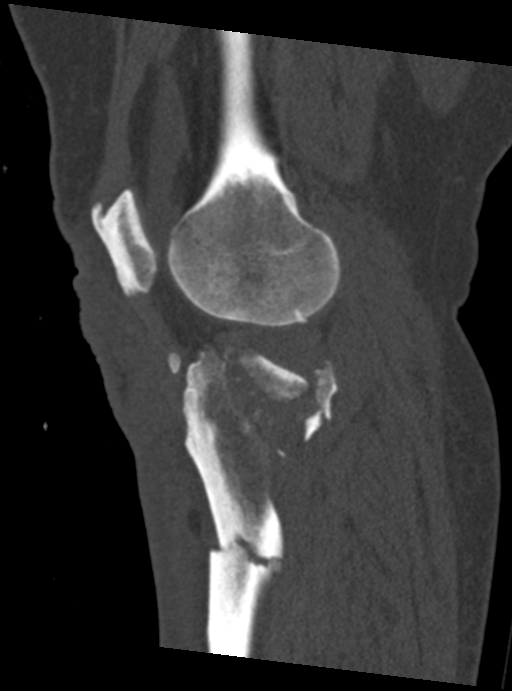
[im 107/129  bone]
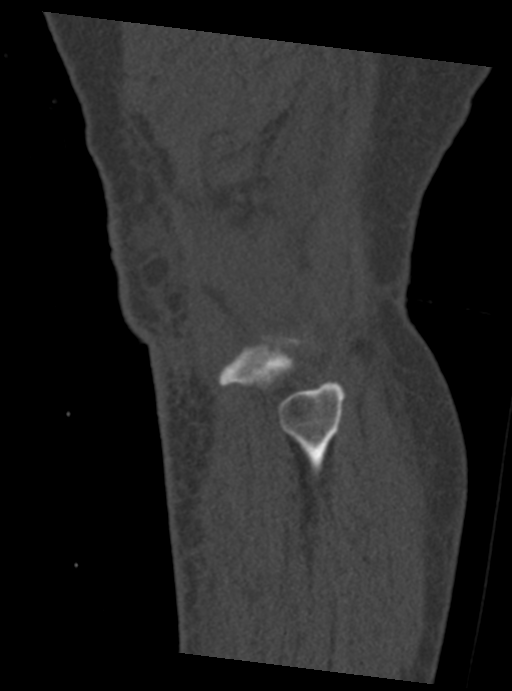

[13 of 35 positions shown; findings below may reference images not displayed]

FINDINGS: Bones/Joint/Cartilage

Moderate lipohemarthrosis in the suprapatellar pouch of the left
knee joint.

Acute, comminuted fracture of the lateral tibial plateau with 1 cm
of lateral distraction of the tibial plateau fracture fragments and
0.9 cm lateral tibial plateau depression posteriorly as well as
laterally. Fracture undermines the tibial spine and is extends to
the base of the medial tibial spine along its medial base.

A comminuted fracture involving the metadiaphysis of the proximal
tibia is noted with 16 degrees of apex posterior angulation noted.

Intact patella.  No joint dislocation.

Ligaments

Suboptimally assessed by CT.

Muscles and Tendons

No muscle atrophy nor hemorrhage. Intact appearing extensor
mechanism tendons. Small ossification is seen along the distal
patellar tendon.

Soft tissues

Diffuse soft tissue edema about the knee more so anteriorly.
IMPRESSION: Acute, closed, Schatzker type VI condylar fracture with transverse
component through the metadiaphysis. The following findings are as
noted below:

1. Moderate lipohemarthrosis associated with an acute comminuted
fracture of the lateral tibial plateau with 1 cm of distraction in
the transverse plane with 0.9 cm of posterolateral tibial plateau
depression. The fracture undermines the tibial spines.
2. A comminuted apex posterior fracture of the proximal
metadiaphysis of the tibia is noted with 16 degrees of anterior
angulation of the distal fracture fragment.
3. Moderate soft tissue edema.

## 2019-03-31 IMAGING — DX DG KNEE COMPLETE 4+V*L*
2 series · 2 of 2 positions shown · non-contrast
Comparison: None.

CLINICAL DATA: Foot fell through a hole, with acute onset of left
knee pain. Initial encounter.

EXAM:
LEFT KNEE - COMPLETE 4+ VIEW

[knee ap]
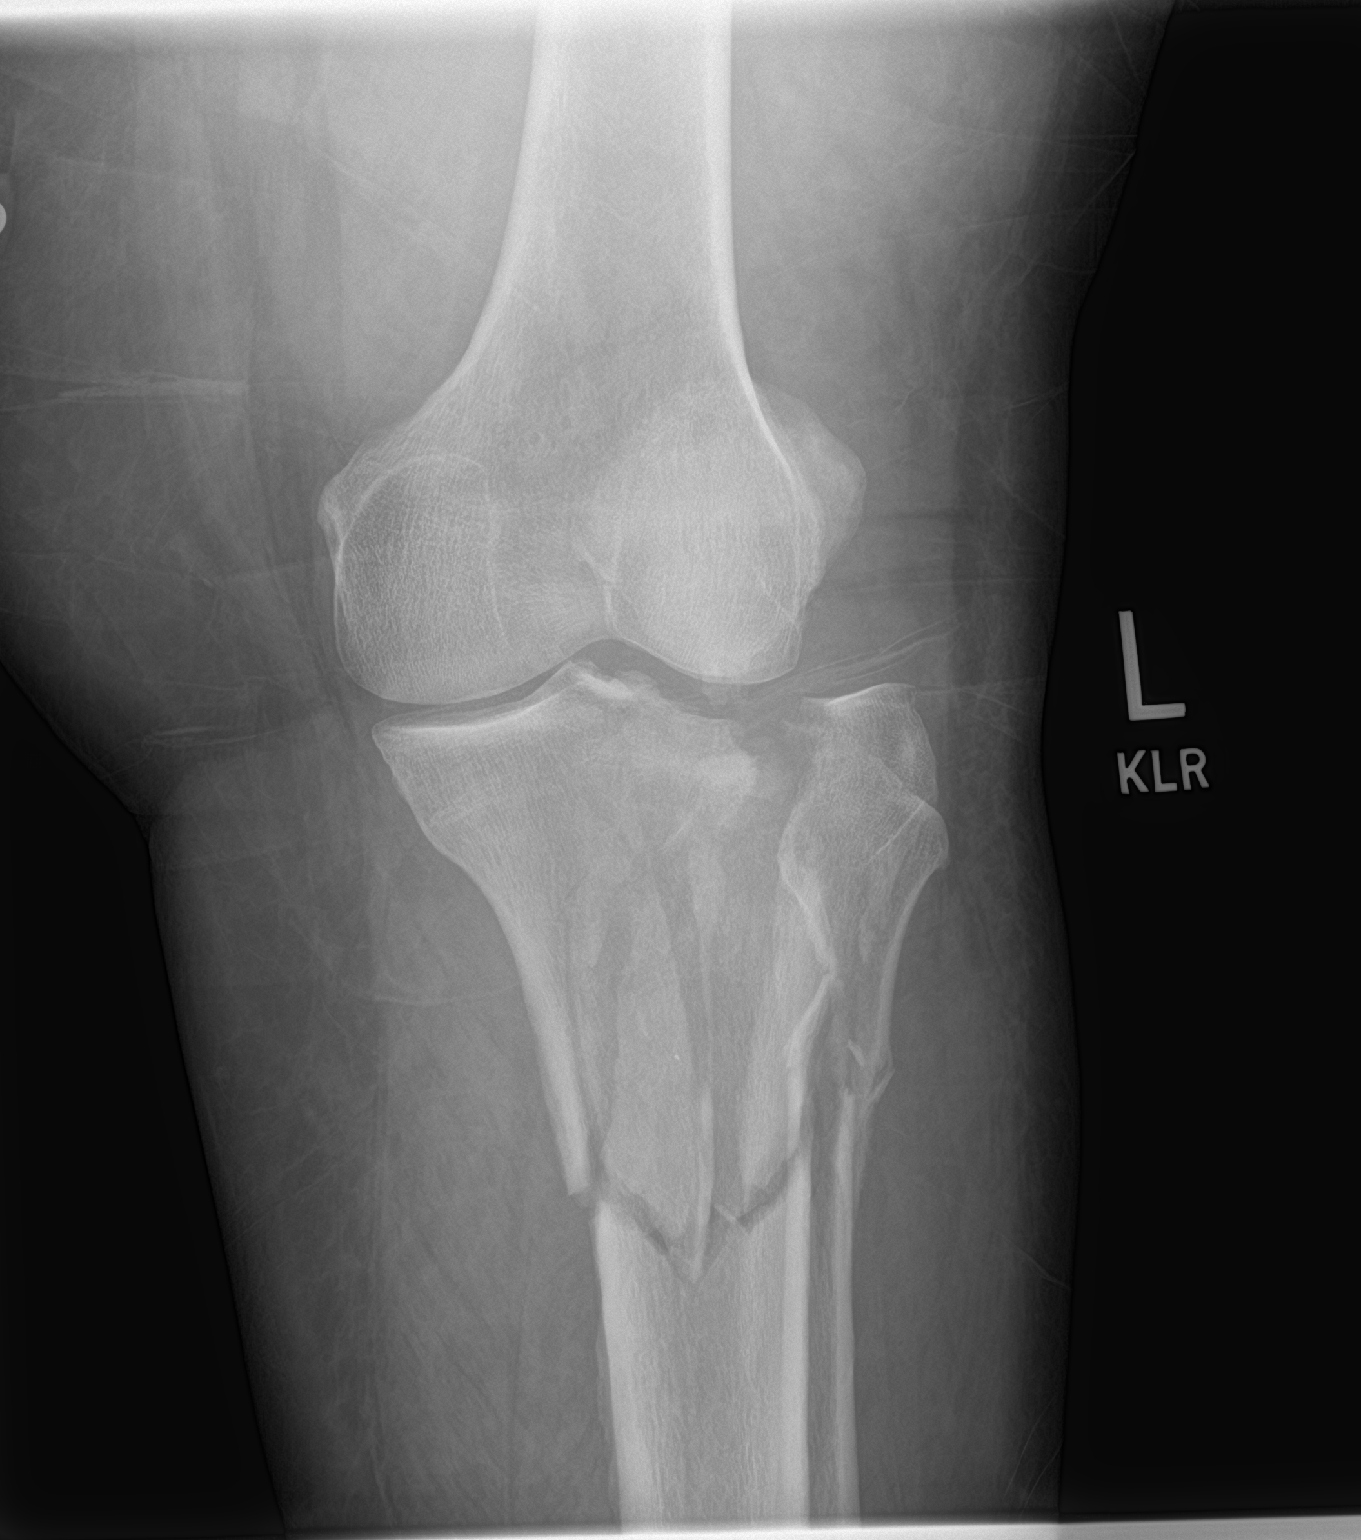

[knee lat]
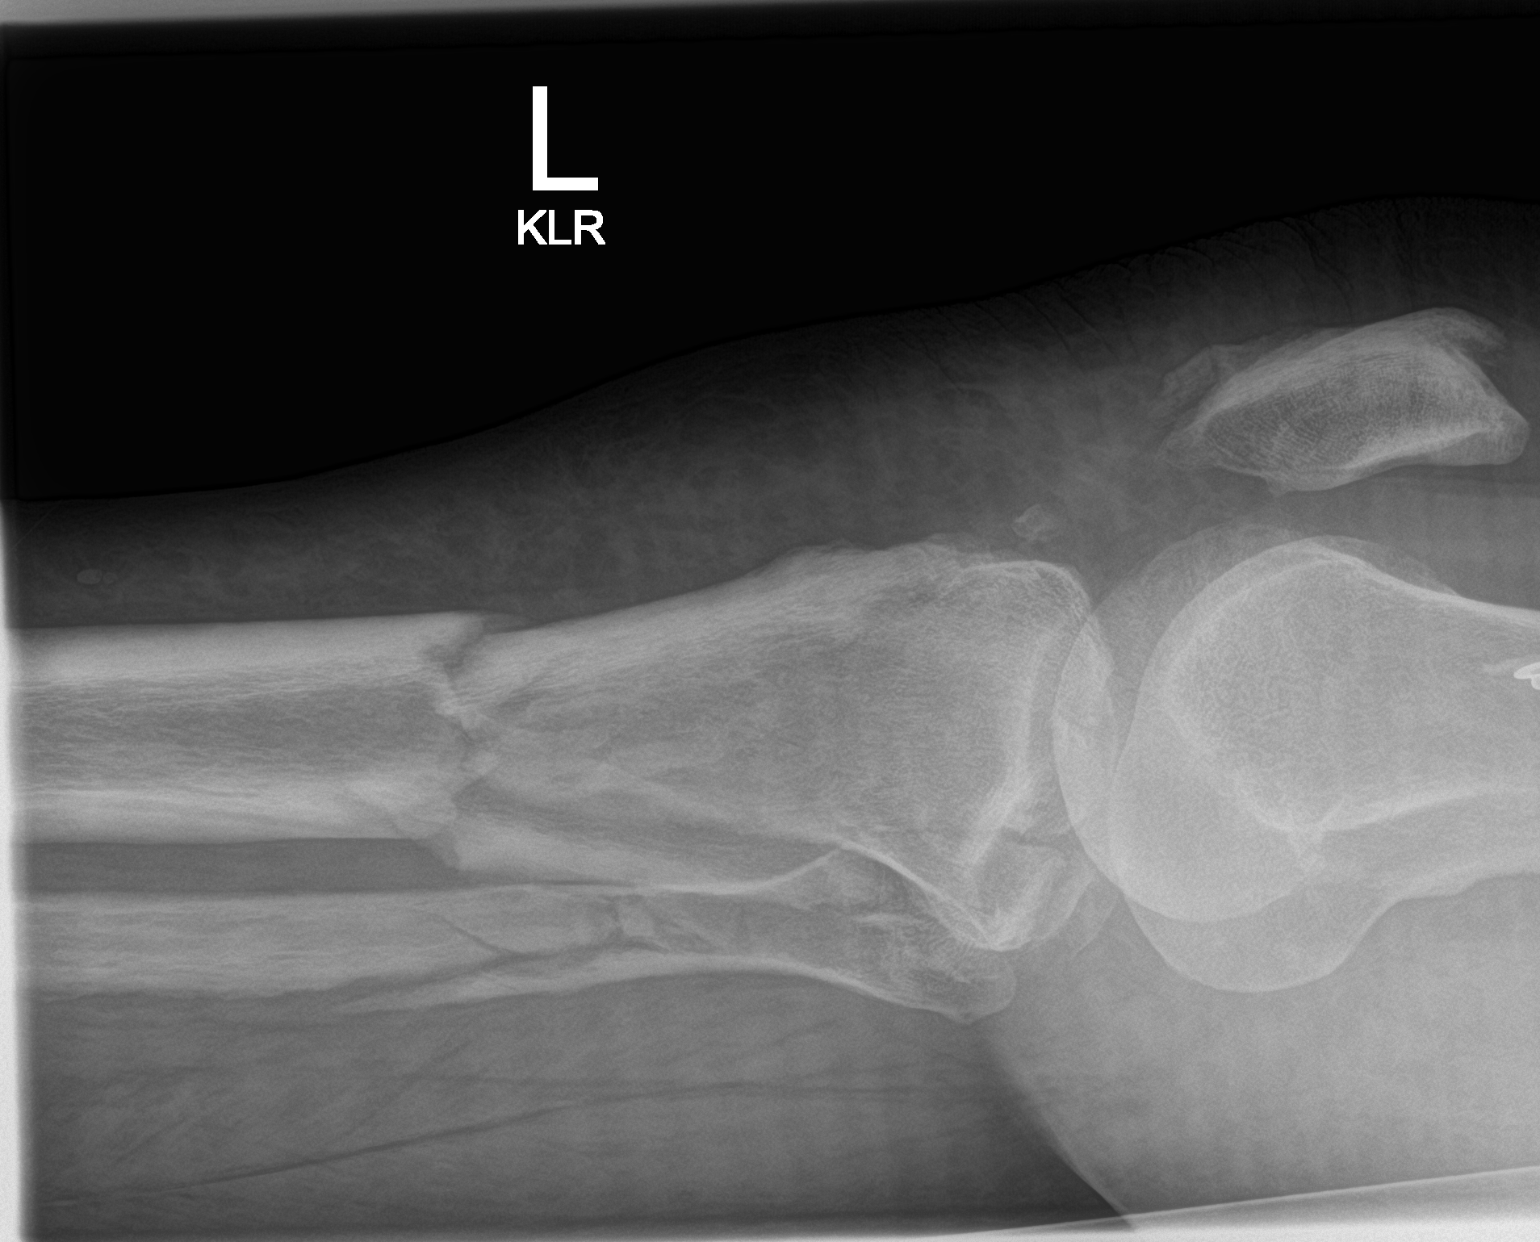

[2 of 2 positions shown; findings below may reference images not displayed]

FINDINGS: There is a significantly comminuted fractures involving the proximal
tibia and fibula. The tibial fracture extends from the lateral
tibial plateau and tibial spine into the proximal tibial diaphysis.
There is approximately 2 cm of separation at the lateral tibial
plateau.

There is lateral displacement of the patella, which may reflect some
degree of subluxation. Underlying lipohemarthrosis is noted. Soft
tissue swelling is noted about the left knee and proximal left lower
leg.
IMPRESSION: 1. Significantly comminuted fractures involving the proximal tibia
and fibula. Tibial fracture extends from the lateral tibial plateau
and tibial spine into the proximal tibial diaphysis, with
approximately 2 cm of separation at the lateral tibial plateau.
2. Lateral displacement of the patella may reflect some degree of
subluxation with medial patellar retinaculum injury.
3. Underlying lipohemarthrosis.

## 2020-07-16 NOTE — Progress Notes (Signed)
Virtual Visit via Telephone Note   This visit type was conducted due to national recommendations for restrictions regarding the COVID-19 Pandemic (e.g. social distancing) in an effort to limit this patient's exposure and mitigate transmission in our community.  Due to her co-morbid illnesses, this patient is at least at moderate risk for complications without adequate follow up.  This format is felt to be most appropriate for this patient at this time.  The patient did not have access to video technology/had technical difficulties with video requiring transitioning to audio format only (telephone).  All issues noted in this document were discussed and addressed.  No physical exam could be performed with this format.  Please refer to the patient's chart for her  consent to telehealth for [facility].    Date:  07/17/2020   ID:  Tyler Walters, DOB Oct 27, 1966, MRN [id number] The patient was identified using 2 identifiers.  Patient Location: Home Provider Location: Office/Clinic  PCP:  Clent Demark, PA-C  Cardiologist:  [name], MD   Electrophysiologist:  None   Evaluation Performed:  Follow-Up Visit  Chief Complaint: Hypertension, chest pain  Patient Profile: Tyler Walters is a 54 y.o. adult with:  Morbid Obesity  OSA  Anemia  Hypertension   Pulmonary hypertension    Prior CV Studies: Echocardiogram 03/26/2018 Mild LVH, EF 55-60, no RWMA, GR 1 DD, mildly dilated aortic root, mild BAE, mild RVE   History of Present Illness:   Tyler Walters was evaluated by Dr. Acie Fredrickson in 11/2018 for chest pain and hypertension.  She had an episode of chest pain 6 mos prior but no recurrence.  No further workup was recommended.  She was started on Furosemide for her BP.  F/u was recommended in 2 mos but she has not been seen since.    Today, the patient notes that her blood pressures are typically optimal unless she is stressed out or more active.  She has had some issues with falls  but has not fallen in the past 2 months.  If she is more active, she does have some chest tightness.  This symptom has been stable for the past 2 years.  It seems to be improved.  She has not had syncope.  She sleeps on 3 pillows chronically.  She has dependent lower extremity swelling.  Past Medical History:  Diagnosis Date  . Essential hypertension    Echo 5/19: Mild LVH, EF 55-60, no RWMA, GR 1 DD, mildly dilated aortic root, mild BAE, mild RVE  . Lipoma of abdominal wall    Large lipoma  . Morbid obesity ([place])   . Secondary pulmonary hypertension 03/20/2018  . Sleep apnea    Past Surgical History:  Procedure Laterality Date  . EXTERNAL FIXATION LEG Left 01/16/2018   Procedure: LEFT KNEE SPANNING EXTERNAL FIXATION LEG;  Surgeon: Leandrew Koyanagi, MD;  Location: [facility];  Service: Orthopedics;  Laterality: Left;  . EXTERNAL FIXATION REMOVAL Left 01/24/2018   Procedure: REMOVAL EXTERNAL FIXATION LEG;  Surgeon: Shona Needles, MD;  Location: [place];  Service: Orthopedics;  Laterality: Left;  both upper and lower leg involved with fixator  . LIPOMA EXCISION Right 01/19/2018   Procedure: EXCISIONOF RIGHT CHEST WALL MASS, EXCISION OF ABDOMINAL MASS;  Surgeon: Donnie Mesa, MD;  Location: [place];  Service: General;  Laterality: Right;  . ORIF TIBIA PLATEAU Left 01/24/2018   Procedure: OPEN REDUCTION INTERNAL FIXATION (ORIF) TIBIAL PLATEAU;  Surgeon: Shona Needles, MD;  Location: [place];  Service: Orthopedics;  Laterality: Left;  . PANNICULECTOMY N/A 01/19/2018   Procedure: PANNICULECTOMY;  Surgeon: Donnie Mesa, MD;  Location: [place];  Service: General;  Laterality: N/A;     Current Meds  Medication Sig  . amLODipine-olmesartan (AZOR) 10-40 MG tablet Take 1 tablet by mouth daily.  Marland Kitchen aspirin EC 81 MG tablet Take 1 tablet (81 mg total) by mouth daily.  . ferrous sulfate 325 (65 FE) MG EC tablet Take 1 tablet (325 mg total) by mouth 2 (two) times daily.  . furosemide (LASIX) 40 MG tablet Take 40 mg  by mouth 2 (two) times daily.  . meloxicam (MOBIC) 15 MG tablet Take 15 mg by mouth daily.  . nitroGLYCERIN (NITROSTAT) 0.4 MG SL tablet Place 1 tablet (0.4 mg total) under the tongue every 5 (five) minutes as needed for chest pain.  Marland Kitchen oxyCODONE-acetaminophen (PERCOCET) 10-325 MG tablet Take 1 tablet by mouth in the morning, at noon, and at bedtime.   . potassium chloride (K-DUR) 10 MEQ tablet Take 1 tablet (10 mEq total) by mouth daily.     Allergies:   Morphine and related and Tape   Social History   Tobacco Use  . Smoking status: Never Smoker  . Smokeless tobacco: Never Used  Substance Use Topics  . Alcohol use: No  . Drug use: No     Family Hx: The patient's family history includes Diabetes in her father and mother.  ROS:   Please see the history of present illness.    She has some lipomas that need to be removed. No melena, hematochezia.    Labs/Other Tests and Data Reviewed:    EKG:  No ECG reviewed.  Recent Labs: No results found for requested labs within last 8760 hours.   Recent Lipid Panel Lab Results  Component Value Date/Time   CHOL 142 10/08/2018 09:00 AM   TRIG 45 10/08/2018 09:00 AM   HDL 52 10/08/2018 09:00 AM   CHOLHDL 2.7 10/08/2018 09:00 AM   CHOLHDL 4.5 06/14/2009 02:20 PM   LDLCALC 81 10/08/2018 09:00 AM    Wt Readings from Last 3 Encounters:  07/17/20 (!) 423 lb (191.9 kg)  12/06/18 (!) 427 lb 12.8 oz (194 kg)  10/23/18 (!) 413 lb 3.2 oz (187.4 kg)     Objective:    Vital Signs:  BP 126/89   Pulse 97   Ht 5\' 8"  (1.727 m)   Wt (!) 423 lb (191.9 kg)   BMI 64.32 kg/m    VITAL SIGNS:  reviewed GEN:  no acute distress RESPIRATORY:  No labored breathing noted  ASSESSMENT & PLAN:    1. Essential hypertension Overall, blood pressure is stable.  This is mainly followed by primary care.  She now takes furosemide twice daily.  We will request her most recent labs from primary care.  Continue current medications.  2. Morbid obesity  ([place]) She is looking into bariatric surgery.  This is currently on hold due to COVID-19.  I have encouraged her to continue to pursue this to reduce risk and improve quality of life.  3. Chest pain, atypical She continues to have episodes of chest discomfort if she is more active.  This symptom has been stable for over 2 years now, and is somewhat improved.  Her size limits testing.  Her symptoms are not significant enough to warrant cardiac catheterization.  We would not likely get good images with nuclear stress testing or coronary CT.  No further testing indicated at this time.  Follow-up 1 year.    Time:   Today, I have spent 12 minutes with the patient with telehealth technology discussing the above problems.     Medication Adjustments/Labs and Tests Ordered: Current medicines are reviewed at length with the patient today.  Concerns regarding medicines are outlined above.   Tests Ordered: No orders of the defined types were placed in this encounter.   Medication Changes: No orders of the defined types were placed in this encounter.   Follow Up:  In Person in 1 year(s)  Signed, Richardson Dopp, PA-C  07/17/2020 9:00 AM    [facility]

## 2020-07-17 ENCOUNTER — Other Ambulatory Visit: Payer: Self-pay

## 2020-07-17 ENCOUNTER — Encounter: Payer: Self-pay | Admitting: Physician Assistant

## 2020-07-17 ENCOUNTER — Telehealth (INDEPENDENT_AMBULATORY_CARE_PROVIDER_SITE_OTHER): Payer: Medicare Other | Admitting: Physician Assistant

## 2020-07-17 ENCOUNTER — Telehealth: Payer: Self-pay | Admitting: *Deleted

## 2020-07-17 VITALS — BP 126/89 | HR 97 | Ht 68.0 in | Wt >= 6400 oz

## 2020-07-17 DIAGNOSIS — R0789 Other chest pain: Secondary | ICD-10-CM

## 2020-07-17 DIAGNOSIS — I10 Essential (primary) hypertension: Secondary | ICD-10-CM

## 2020-07-17 NOTE — Patient Instructions (Signed)
Medication Instructions:  Your physician recommends that you continue on your current medications as directed. Please refer to the Current Medication list given to you today.  *If you need a refill on your cardiac medications before your next appointment, please call your pharmacy*  Lab Work: None ordered today  Testing/Procedures: None ordered today  Follow-Up: At [facility], you and your health needs are our priority.  As part of our continuing mission to provide you with exceptional heart care, we have created designated Provider Care Teams.  These Care Teams include your primary Cardiologist (physician) and Advanced Practice Providers (APPs -  Physician Assistants and Nurse Practitioners) who all work together to provide you with the care you need, when you need it.  We recommend signing up for the patient portal called "MyChart".  Sign up information is provided on this After Visit Summary.  MyChart is used to connect with patients for Virtual Visits (Telemedicine).  Patients are able to view lab/test results, encounter notes, upcoming appointments, etc.  Non-urgent messages can be sent to your provider as well.   To learn more about what you can do with MyChart, go to [email or web address].    Your next appointment:   12 month(s)  The format for your next appointment:   In Person  Provider:   [name], MD

## 2020-07-17 NOTE — Telephone Encounter (Signed)
  Patient Consent for Virtual Visit         Tyler Walters has provided verbal consent on 07/17/2020 for a virtual visit (video or telephone).   CONSENT FOR VIRTUAL VISIT FOR:  Tyler Walters  By participating in this virtual visit I agree to the following:  I hereby voluntarily request, consent and authorize [place] and its employed or contracted physicians, physician assistants, nurse practitioners or other licensed health care professionals (the Practitioner), to provide me with telemedicine health care services (the "Services") as deemed necessary by the treating Practitioner. I acknowledge and consent to receive the Services by the Practitioner via telemedicine. I understand that the telemedicine visit will involve communicating with the Practitioner through live audiovisual communication technology and the disclosure of certain medical information by electronic transmission. I acknowledge that I have been given the opportunity to request an in-person assessment or other available alternative prior to the telemedicine visit and am voluntarily participating in the telemedicine visit.  I understand that I have the right to withhold or withdraw my consent to the use of telemedicine in the course of my care at any time, without affecting my right to future care or treatment, and that the Practitioner or I may terminate the telemedicine visit at any time. I understand that I have the right to inspect all information obtained and/or recorded in the course of the telemedicine visit and may receive copies of available information for a reasonable fee.  I understand that some of the potential risks of receiving the Services via telemedicine include:  Marland Kitchen Delay or interruption in medical evaluation due to technological equipment failure or disruption; . Information transmitted may not be sufficient (e.g. poor resolution of images) to allow for appropriate medical decision making by the Practitioner;  and/or  . In rare instances, security protocols could fail, causing a breach of personal health information.  Furthermore, I acknowledge that it is my responsibility to provide information about my medical history, conditions and care that is complete and accurate to the best of my ability. I acknowledge that Practitioner's advice, recommendations, and/or decision may be based on factors not within their control, such as incomplete or inaccurate data provided by me or distortions of diagnostic images or specimens that may result from electronic transmissions. I understand that the practice of medicine is not an exact science and that Practitioner makes no warranties or guarantees regarding treatment outcomes. I acknowledge that a copy of this consent can be made available to me via my patient portal ([place]), or I can request a printed copy by calling the office of [place].    I understand that my insurance will be billed for this visit.   I have read or had this consent read to me. . I understand the contents of this consent, which adequately explains the benefits and risks of the Services being provided via telemedicine.  . I have been provided ample opportunity to ask questions regarding this consent and the Services and have had my questions answered to my satisfaction. . I give my informed consent for the services to be provided through the use of telemedicine in my medical care

## 2020-09-21 ENCOUNTER — Other Ambulatory Visit: Payer: Self-pay | Admitting: Surgery

## 2020-09-21 ENCOUNTER — Other Ambulatory Visit: Payer: Self-pay

## 2020-09-21 ENCOUNTER — Ambulatory Visit (HOSPITAL_COMMUNITY)
Admission: RE | Admit: 2020-09-21 | Discharge: 2020-09-21 | Disposition: A | Discharge status: Home / Self Care | Payer: Medicare Other | Source: Ambulatory Visit | Attending: Surgery | Admitting: Surgery

## 2020-09-21 DIAGNOSIS — R2241 Localized swelling, mass and lump, right lower limb: Secondary | ICD-10-CM | POA: Diagnosis not present

## 2020-09-23 ENCOUNTER — Ambulatory Visit: Payer: Self-pay | Admitting: Surgery

## 2020-09-23 NOTE — H&P (Signed)
History of Present Illness Tyler Walters. Veronica Guerrant MD; 09/21/2020 10:52 AM) The patient is a 54 year old male who presents with a complaint of Mass. The patient had severe asymmetry of a very large abdominal pannus. It was unclear what was causing this massive asymmetry. I had concern for a subcutaneous tumor. He also had a 4 cm mass on his right chest wall. On 01/19/18 we excised lipoma from the right chest wall. We also excised the right abdominal mass along with panniculectomy across his entire lower abdomen. We removed a total of 100 pounds of adipose tissue and skin. His wound was closed with multiple staples and sutures. He had some wound issues after surgery that are not entirely unexpected from an incision this large. A couple of staples were removed and there was some dehiscence of the skin. He has had a lot of serous drainage coming through the incision. Attempts were made to place a Prevena wound VAC over this area but it has difficulty maintaining suction. We removed the VAC on Monday 02/05/18.  The patient was initially hospitalized for a left tibial plateau fracture. This has been repaired and he is finally home from rehabilitation.  09/21/20 The patient continues to have some limited mobility due to pain in his left leg. His in a colectomy incision is completely healed and is not causing any issues. He has had several years of a slowly enlarging soft tissue mass on the right side of his neck. He has also noted some firmness and swelling in the right shin. He comes in today to discuss possible excision of these. The patient is in the bariatric pathway but has not yet met with a surgeon. He is interested in bariatric surgery as he has been unable to lose weight.   Past Surgical History Geni Bers [place], RMA; 09/21/2020 10:03 AM) Knee Surgery  Left.  Diagnostic Studies History Geni Bers [place], RMA; 09/21/2020 10:03 AM) Colonoscopy  never 5-10 years ago  Allergies  Geni Bers [place], RMA; 09/21/2020 10:03 AM) Adhesive Tape 1"x5yd *MEDICAL DEVICES AND SUPPLIES*  Morphine Sulfate *ANALGESICS - OPIOID*  Penicillins  Allergies Reconciled   Medication History (Jacqueline Haggett, RMA; 09/21/2020 10:04 AM) Furosemide (40MG Tablet, Oral) Active. amLODIPine-Olmesartan (10-40MG Tablet, Oral) Active. Meloxicam (15MG Tablet, Oral) Active. Potassium Chloride ER (10MEQ Tablet ER, Oral) Active. Pravastatin Sodium (20MG Tablet, Oral) Active. Aspirin (81MG Tablet DR, Oral) Active. Medications Reconciled  Social History Geni Bers [place], RMA; 09/21/2020 10:03 AM) Caffeine use  Carbonated beverages, Tea. No alcohol use  No drug use  Tobacco use  Never smoker.  Family History Geni Bers [place], RMA; 09/21/2020 10:03 AM) Arthritis  Father, Mother. Cerebrovascular Accident  Father. Diabetes Mellitus  Father, Mother. Heart Disease  Father, Mother. Hypertension  Father, Mother, Sister.  Other Problems Geni Bers [place], RMA; 09/21/2020 10:03 AM) Arthritis  Back Pain  High blood pressure     Review of Systems (Jacqueline Haggett RMA; 09/21/2020 10:03 AM) General Present- Weight Gain. Not Present- Appetite Loss, Chills, Fatigue, Fever, Night Sweats and Weight Loss. Skin Present- Dryness. Not Present- Change in Wart/Mole, Hives, Jaundice, New Lesions, Non-Healing Wounds, Rash and Ulcer. HEENT Present- Seasonal Allergies. Not Present- Earache, Hearing Loss, Hoarseness, Nose Bleed, Oral Ulcers, Ringing in the Ears, Sinus Pain, Sore Throat, Visual Disturbances, Wears glasses/contact lenses and Yellow Eyes. Gastrointestinal Present- Constipation. Not Present- Abdominal Pain, Bloating, Bloody Stool, Change in Bowel Habits, Chronic diarrhea, Difficulty Swallowing, Excessive gas, Gets full quickly at meals, Hemorrhoids, Indigestion, Nausea, Rectal Pain and Vomiting. Musculoskeletal Present- Back Pain, Joint Pain, Joint  Stiffness, Muscle  Pain, Muscle Weakness and Swelling of Extremities. Neurological Present- Numbness and Trouble walking. Not Present- Decreased Memory, Fainting, Headaches, Seizures, Tingling, Tremor and Weakness.  Vitals [employer] Haggett RMA; 09/21/2020 10:04 AM) 09/21/2020 10:04 AM Weight: 422.8 lb Height: 70in Body Surface Area: 2.87 m Body Mass Index: 60.66 kg/m  Temp.: 98.76F (Temporal)  Pulse: 120 (Regular)  P.OX: 94% (Room air) BP: 160/82(Sitting, Left Arm, Large)       Physical Exam Rodman Key K. Emitt Maglione MD; 09/21/2020 10:55 AM) The physical exam findings are as follows: Note: Well-developed well-nourished no apparent distress Right side of his neck shows a oval shaped 3 cm firm subcutaneous mass. This appears to be a subcutaneous lipoma.  The panniculectomy incision is well-healed with no sign of infection. There is some mild depigmentation of parts of the incision The anterior right lower leg shows a 1 cm firm palpable mass with some minimal surrounding swelling. There is no discrete mass palpated around this firm area. This seems to be in the area that may involve his saphenous vein.    Assessment & Plan Tyler Walters. 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  We will excise the neck lipoma and the right leg mass under anesthesia.  The surgical procedure has been discussed with the patient.  Potential risks, benefits, alternative treatments, and expected outcomes have been explained.  All of the patient's questions at this time have been answered.  The likelihood of reaching the patient's treatment goal is good.  The patient understand the proposed surgical procedure and wishes to proceed.  Tyler Walters. Georgette Dover, MD, [facility]  General/ Trauma  Surgery   09/23/2020 5:55 PM

## 2020-10-21 NOTE — Pre-Procedure Instructions (Signed)
[place], [place] [address] [place] [address] Phone: [phone] Fax: [phone]      Your procedure is scheduled on Tuesday December 21st.  Report to [facility] Main Entrance "A" at 11:30 A.M., and check in at the Admitting office.  Call this number if you have problems the morning of surgery:  [phone]  Call [phone] if you have any questions prior to your surgery date Monday-Friday 8am-4pm    Remember:  Do not eat or drink after midnight the night before your surgery   Take these medicines the morning of surgery with A SIP OF WATER  oxyCODONE-acetaminophen (PERCOCET) if needed nitroGLYCERIN (NITROSTAT) if needed   As of today, STOP taking any Aspirin (unless otherwise instructed by your surgeon), meloxicam (MOBIC), Aleve, Naproxen, Ibuprofen, Motrin, Advil, Goody's, BC's, all herbal medications, fish oil, and all vitamins.                      Do not wear jewelry, make up, or nail polish            Do not wear lotions, powders, perfumes/colognes, or deodorant.            Do not shave 48 hours prior to surgery.  Men may shave face and neck.            Do not bring valuables to the hospital.            [facility] is not responsible for any belongings or valuables.  Do NOT Smoke (Tobacco/Vaping) or drink Alcohol 24 hours prior to your procedure If you use a CPAP at night, you may bring all equipment for your overnight stay.   Contacts, glasses, dentures or bridgework may not be worn into surgery.      For patients admitted to the hospital, discharge time will be determined by your treatment team.   Patients discharged the day of surgery will not be allowed to drive home, and someone needs to stay with them for 24 hours.    Special instructions:   [place] [place]- Preparing For Surgery  Before surgery, you can play an important role. Because skin is not sterile, your skin needs to be as free of germs  as possible. You can reduce the number of germs on your skin by washing with CHG (chlorahexidine gluconate) Soap before surgery.  CHG is an antiseptic cleaner which kills germs and bonds with the skin to continue killing germs even after washing.    Oral Hygiene is also important to reduce your risk of infection.  Remember - BRUSH YOUR TEETH THE MORNING OF SURGERY WITH YOUR REGULAR TOOTHPASTE  Please do not use if you have an allergy to CHG or antibacterial soaps. If your skin becomes reddened/irritated stop using the CHG.  Do not shave (including legs and underarms) for at least 48 hours prior to first CHG shower. It is OK to shave your face.  Please follow these instructions carefully.   1. Shower the NIGHT BEFORE SURGERY and the MORNING OF SURGERY with CHG Soap.   2. If you chose to wash your hair, wash your hair first as usual with your normal shampoo.  3. After you shampoo, rinse your hair and body thoroughly to remove the shampoo.  4. Use CHG as you would any other liquid soap. You can apply CHG directly to the skin and wash gently with a scrungie or a clean washcloth.  5. Apply the CHG Soap to your body ONLY FROM THE NECK DOWN.  Do not use on open wounds or open sores. Avoid contact with your eyes, ears, mouth and genitals (private parts). Wash Face and genitals (private parts)  with your normal soap.   6. Wash thoroughly, paying special attention to the area where your surgery will be performed.  7. Thoroughly rinse your body with warm water from the neck down.  8. DO NOT shower/wash with your normal soap after using and rinsing off the CHG Soap.  9. Pat yourself dry with a CLEAN TOWEL.  10. Wear CLEAN PAJAMAS to bed the night before surgery  11. Place CLEAN SHEETS on your bed the night of your first shower and DO NOT SLEEP WITH PETS.   Day of Surgery: Wear Clean/Comfortable clothing the morning of surgery Do not apply any deodorants/lotions.   Remember to brush your teeth  WITH YOUR REGULAR TOOTHPASTE.   Please read over the following fact sheets that you were given.

## 2020-10-22 ENCOUNTER — Other Ambulatory Visit: Payer: Self-pay

## 2020-10-22 ENCOUNTER — Encounter (HOSPITAL_COMMUNITY)
Admission: RE | Admit: 2020-10-22 | Discharge: 2020-10-22 | Disposition: A | Discharge status: Home / Self Care | Payer: Medicare Other | Source: Ambulatory Visit | Attending: Surgery | Admitting: Surgery

## 2020-10-22 ENCOUNTER — Encounter (HOSPITAL_COMMUNITY): Payer: Self-pay

## 2020-10-22 DIAGNOSIS — Z01818 Encounter for other preprocedural examination: Secondary | ICD-10-CM | POA: Diagnosis not present

## 2020-10-22 HISTORY — DX: Family history of other specified conditions: Z84.89

## 2020-10-22 HISTORY — DX: Personal history of urinary calculi: Z87.442

## 2020-10-22 LAB — CBC
HCT: 45.2 % (ref 39.0–52.0)
Hemoglobin: 13.9 g/dL (ref 13.0–17.0)
MCH: 25 pg — ABNORMAL LOW (ref 26.0–34.0)
MCHC: 30.8 g/dL (ref 30.0–36.0)
MCV: 81.1 fL (ref 80.0–100.0)
Platelets: 356 10*3/uL (ref 150–400)
RBC: 5.57 MIL/uL (ref 4.22–5.81)
RDW: 16.2 % — ABNORMAL HIGH (ref 11.5–15.5)
WBC: 6.1 10*3/uL (ref 4.0–10.5)
nRBC: 0 % (ref 0.0–0.2)

## 2020-10-22 LAB — BASIC METABOLIC PANEL
Anion gap: 8 (ref 5–15)
BUN: 21 mg/dL — ABNORMAL HIGH (ref 6–20)
CO2: 27 mmol/L (ref 22–32)
Calcium: 8.9 mg/dL (ref 8.9–10.3)
Chloride: 105 mmol/L (ref 98–111)
Creatinine, Ser: 1.09 mg/dL (ref 0.61–1.24)
GFR, Estimated: >60 mL/min (ref >60)
Glucose, Bld: 91 mg/dL (ref 70–99)
Potassium: 3.8 mmol/L (ref 3.5–5.1)
Sodium: 140 mmol/L (ref 135–145)

## 2020-10-22 NOTE — Progress Notes (Signed)
PCP - York Ram  Cardiologist - Denies  Chest x-ray - Denies  EKG - 10/22/20  Stress Test - Denies  ECHO - 03/26/18 (E)  Cardiac Cath - Denies  AICD-na PM-na LOOP-na  Sleep Study - Yes- Positive CPAP - Denies  LABS- 10/22/20: CBC, BMP 10/23/20: COVID  ASA- LD- 1 week ago  ERAS- No  HA1C- Denies  Anesthesia- Yes- EKG  Pt denies having chest pain, sob, or fever at this time. All instructions explained to the pt, with a verbal understanding of the material. Pt agrees to go over the instructions while at home for a better understanding. Pt also instructed to self quarantine after being tested for COVID-19. The opportunity to ask questions was provided.   Coronavirus Screening  Have you experienced the following symptoms:  Cough yes/no: No Fever (>100.64F)  yes/no: No Runny nose yes/no: No Sore throat yes/no: No Difficulty breathing/shortness of breath  yes/no: No  Have you or a family member traveled in the last 14 days and where? yes/no: No    If the patient indicates "YES" to the above questions, their PAT will be rescheduled to limit the exposure to others and, the surgeon will be notified. THE PATIENT WILL NEED TO BE ASYMPTOMATIC FOR 14 DAYS.   If the patient is not experiencing any of these symptoms, the PAT nurse will instruct them to NOT bring anyone with them to their appointment since they may have these symptoms or traveled as well.   Please remind your patients and families that hospital visitation restrictions are in effect and the importance of the restrictions.

## 2020-10-23 ENCOUNTER — Other Ambulatory Visit (HOSPITAL_COMMUNITY)
Admission: RE | Admit: 2020-10-23 | Discharge: 2020-10-23 | Disposition: A | Discharge status: Home / Self Care | Payer: Medicare Other | Source: Ambulatory Visit | Attending: Surgery | Admitting: Surgery

## 2020-10-23 DIAGNOSIS — Z01812 Encounter for preprocedural laboratory examination: Secondary | ICD-10-CM | POA: Diagnosis present

## 2020-10-23 DIAGNOSIS — Z20822 Contact with and (suspected) exposure to covid-19: Secondary | ICD-10-CM | POA: Diagnosis not present

## 2020-10-23 LAB — SARS CORONAVIRUS 2 (TAT 6-24 HRS): SARS Coronavirus 2: NEGATIVE

## 2020-10-23 NOTE — Anesthesia Preprocedure Evaluation (Addendum)
Anesthesia Evaluation  Patient identified by MRN, date of birth, ID band Patient awake    Reviewed: Allergy & Precautions, NPO status , Patient's Chart, lab work & pertinent test results  Airway Mallampati: III  TM Distance: >3 FB Neck ROM: Full    Dental  (+) Dental Advisory Given   Pulmonary sleep apnea ,    breath sounds clear to auscultation       Cardiovascular hypertension, Pt. on medications  Rhythm:Regular Rate:Normal     Neuro/Psych negative neurological ROS     GI/Hepatic negative GI ROS, Neg liver ROS,   Endo/Other  negative endocrine ROS  Renal/GU negative Renal ROS     Musculoskeletal   Abdominal   Peds  Hematology negative hematology ROS (+)   Anesthesia Other Findings   Reproductive/Obstetrics                            Anesthesia Physical Anesthesia Plan  ASA: III  Anesthesia Plan: General   Post-op Pain Management:    Induction: Intravenous  PONV Risk Score and Plan: 2 and Dexamethasone, Ondansetron and Treatment may vary due to age or medical condition  Airway Management Planned: Oral ETT  Additional Equipment: None  Intra-op Plan:   Post-operative Plan: Extubation in OR  Informed Consent: I have reviewed the patients History and Physical, chart, labs and discussed the procedure including the risks, benefits and alternatives for the proposed anesthesia with the patient or authorized representative who has indicated his/her understanding and acceptance.     Dental advisory given  Plan Discussed with: CRNA  Anesthesia Plan Comments: ( )       Anesthesia Quick Evaluation

## 2020-10-23 NOTE — Progress Notes (Signed)
Anesthesia Chart Review:  Case: [id number] Date/Time: 10/27/20 1315   Procedure: EXCISION OF SUBCUTANEOUS MASS RIGHT NECK AND RIGHT LEG (Right )   Anesthesia type: General   Pre-op diagnosis: right lipoma right leg subcutaneous mass   Location: [facility] OR ROOM 02 / [facility]   Surgeons: Donnie Mesa, MD      DISCUSSION: Patient is a 54 year old male scheduled for the above procedure.  History includes never smoker, HTN, OSA, secondary pulmonary hypertension (suggested on 02/14/18 CTA chest, unable to estimated RVSP on 03/26/18 echo), lipoma, morbid obesity (BMI 69). S/p I&D abdominal wall abscess 06/17/09. S/p external fixation left tibial plateau fracture 01/16/18, s/p removal of external fixator and ORIF of left tibial plateau/shaft/tubercle fractures, repair of lateral meniscus 01/24/18. S/p excision of right abdominal wall subcutaneous mass with panniculectomy, excision or right chest wall mass (pathology: lipoma) 01/19/18.   Last cardiology evaluation 07/17/2020 by Richardson Dopp, PA-C.  Notes history of atypical chest pain for over 2 years which had showed some improvement.  Symptoms not felt to be significant or typical enough to warrant cardiac catheterization and his size would limit imaging with nuclear stress test or coronary CT. No further testing recommended with one year follow-up. No chest pain per PAT RN interview.   Per [facility], hold ASA 5 days prior to surgery.  His BMI is 69.18. CTA finding in 2019 suggestive of pulmonary hypertension. Normal LV/RV systolic function, unable to estimate RVSP on echo. No apparent anesthesia complication or difficulty extubating with 3 surgeries in March 2019. He has had recent cardiology evaluation. Preoperative COVID-19 test negative on 10/23/20.  Anesthesia team to evaluate on the day of surgery.    VS: BP 123/79   Pulse 94   Temp 36.9 C (Oral)   Resp 19   Ht 5\' 8"  (1.727 m)   Wt (!) 206.4 kg   SpO2 97%   BMI 69.18 kg/m    PROVIDERS: [name],  MD is PCP  [name], MD is cardiologist.    LABS: Labs reviewed: Acceptable for surgery. (all labs ordered are listed, but only abnormal results are displayed)  Labs Reviewed  BASIC METABOLIC PANEL - Abnormal; Notable for the following components:      Result Value   BUN 21 (*)    All other components within normal limits  CBC - Abnormal; Notable for the following components:   MCH 25.0 (*)    RDW 16.2 (*)    All other components within normal limits    IMAGING: CTA Chest 02/14/18: IMPRESSION: 1. Limited scan.  No evidence of pulmonary embolism. 2. Mild cardiomegaly. 3. Dilated main pulmonary artery, suggesting pulmonary arterial hypertension. 4. Hypoventilatory changes in the dependent lungs. Otherwise no active pulmonary disease.   EKG: Normal sinus rhythm Prolonged QT  [QT/QTc 416/495 ms] Abnormal ECG No significant change since last tracing Confirmed by Glenetta Hew [phone]) on 10/22/2020 11:11:23 PM   CV: RLE Venous US 09/21/20: Summary:  RIGHT:  - There is no obvious evidence of deep vein thrombosis in the lower  extremity.  - There is no evidence of superficial venous thrombosis.  - Irregular target of mixed echogenicity in the mid anterior lateral calf.  No vascularization noted.     Echo 03/26/18: Study Conclusions  - Left ventricle: The cavity size was normal. Wall thickness was  increased in a pattern of mild LVH. Systolic function was normal.  The estimated ejection fraction was in the range of 55% to 60%.  Wall motion was normal;  there were no regional wall motion  abnormalities. Doppler parameters are consistent with abnormal  left ventricular relaxation (grade 1 diastolic dysfunction).  - Aortic root: The aortic root was mildly dilated.  - Left atrium: The atrium was mildly dilated.  - Right ventricle: The cavity size was mildly dilated.  - Right atrium: The atrium was mildly dilated.  Impressions:  - Normal LV systolic  function; mild LVH; mild diastolic  dysfunction; mildly dilated aortic root; mild LAE/RAE/RVE; no TR  for estimation of RVSP.    Past Medical History:  Diagnosis Date  . Essential hypertension    Echo 5/19: Mild LVH, EF 55-60, no RWMA, GR 1 DD, mildly dilated aortic root, mild BAE, mild RVE  . Family history of adverse reaction to anesthesia    Sister has had nausea x1  . History of kidney stones   . Lipoma of abdominal wall    Large lipoma  . Morbid obesity ([place])   . Secondary pulmonary hypertension 03/20/2018  . Sleep apnea     Past Surgical History:  Procedure Laterality Date  . EXTERNAL FIXATION LEG Left 01/16/2018   Procedure: LEFT KNEE SPANNING EXTERNAL FIXATION LEG;  Surgeon: Leandrew Koyanagi, MD;  Location: [facility];  Service: Orthopedics;  Laterality: Left;  . EXTERNAL FIXATION REMOVAL Left 01/24/2018   Procedure: REMOVAL EXTERNAL FIXATION LEG;  Surgeon: Shona Needles, MD;  Location: [place];  Service: Orthopedics;  Laterality: Left;  both upper and lower leg involved with fixator  . LIPOMA EXCISION Right 01/19/2018   Procedure: EXCISIONOF RIGHT CHEST WALL MASS, EXCISION OF ABDOMINAL MASS;  Surgeon: Donnie Mesa, MD;  Location: [place];  Service: General;  Laterality: Right;  . ORIF TIBIA PLATEAU Left 01/24/2018   Procedure: OPEN REDUCTION INTERNAL FIXATION (ORIF) TIBIAL PLATEAU;  Surgeon: Shona Needles, MD;  Location: [place];  Service: Orthopedics;  Laterality: Left;  . PANNICULECTOMY N/A 01/19/2018   Procedure: PANNICULECTOMY;  Surgeon: Donnie Mesa, MD;  Location: [place];  Service: General;  Laterality: N/A;    MEDICATIONS: . amLODipine-olmesartan (AZOR) 10-40 MG tablet  . aspirin EC 81 MG tablet  . docusate sodium (COLACE) 100 MG capsule  . ferrous sulfate 325 (65 FE) MG EC tablet  . furosemide (LASIX) 40 MG tablet  . meloxicam (MOBIC) 15 MG tablet  . nitroGLYCERIN (NITROSTAT) 0.4 MG SL tablet  . oxyCODONE-acetaminophen (PERCOCET) 10-325 MG tablet  . potassium  chloride (K-DUR) 10 MEQ tablet  . pravastatin (PRAVACHOL) 20 MG tablet   No current facility-administered medications for this encounter.    Myra Gianotti, PA-C Surgical Short Stay/Anesthesiology [facility] Phone [phone] [facility] Phone [phone] 10/23/2020 6:19 PM

## 2020-10-26 MED ORDER — DEXTROSE 5 % IV SOLN
3.0000 g | INTRAVENOUS | Status: AC
  Administered 2020-10-27: 13:00:00 3 mg via INTRAVENOUS
  Filled 2020-10-26: qty 3000
  Filled 2020-10-26: qty 3

## 2020-10-27 ENCOUNTER — Encounter (HOSPITAL_COMMUNITY): Payer: Self-pay | Admitting: Surgery

## 2020-10-27 ENCOUNTER — Ambulatory Visit (HOSPITAL_COMMUNITY): Payer: Medicare Other | Admitting: Vascular Surgery

## 2020-10-27 ENCOUNTER — Ambulatory Visit (HOSPITAL_COMMUNITY): Payer: Medicare Other | Admitting: Anesthesiology

## 2020-10-27 ENCOUNTER — Ambulatory Visit (HOSPITAL_COMMUNITY)
Admission: RE | Admit: 2020-10-27 | Discharge: 2020-10-27 | Disposition: A | Discharge status: Home / Self Care | Payer: Medicare Other | Attending: Surgery | Admitting: Surgery

## 2020-10-27 ENCOUNTER — Encounter (HOSPITAL_COMMUNITY)
Admission: RE | Disposition: A | Discharge status: Home / Self Care | Payer: Self-pay | Source: Home / Self Care | Attending: Surgery

## 2020-10-27 ENCOUNTER — Other Ambulatory Visit: Payer: Self-pay

## 2020-10-27 DIAGNOSIS — D1723 Benign lipomatous neoplasm of skin and subcutaneous tissue of right leg: Secondary | ICD-10-CM | POA: Diagnosis not present

## 2020-10-27 DIAGNOSIS — Z79899 Other long term (current) drug therapy: Secondary | ICD-10-CM | POA: Insufficient documentation

## 2020-10-27 DIAGNOSIS — D170 Benign lipomatous neoplasm of skin and subcutaneous tissue of head, face and neck: Secondary | ICD-10-CM | POA: Insufficient documentation

## 2020-10-27 DIAGNOSIS — Z880 Allergy status to penicillin: Secondary | ICD-10-CM | POA: Insufficient documentation

## 2020-10-27 DIAGNOSIS — Z886 Allergy status to analgesic agent status: Secondary | ICD-10-CM | POA: Insufficient documentation

## 2020-10-27 DIAGNOSIS — Z7982 Long term (current) use of aspirin: Secondary | ICD-10-CM | POA: Diagnosis not present

## 2020-10-27 DIAGNOSIS — Z791 Long term (current) use of non-steroidal anti-inflammatories (NSAID): Secondary | ICD-10-CM | POA: Diagnosis not present

## 2020-10-27 DIAGNOSIS — Z885 Allergy status to narcotic agent status: Secondary | ICD-10-CM | POA: Insufficient documentation

## 2020-10-27 DIAGNOSIS — Z6841 Body Mass Index (BMI) 40.0 and over, adult: Secondary | ICD-10-CM | POA: Insufficient documentation

## 2020-10-27 HISTORY — PX: MASS EXCISION: SHX2000

## 2020-10-27 SURGERY — EXCISION MASS
Anesthesia: General | Laterality: Right

## 2020-10-27 MED ORDER — SUGAMMADEX SODIUM 500 MG/5ML IV SOLN
INTRAVENOUS | Status: AC
  Filled 2020-10-27: qty 5

## 2020-10-27 MED ORDER — LIDOCAINE 2% (20 MG/ML) 5 ML SYRINGE
INTRAMUSCULAR | Status: AC
  Filled 2020-10-27: qty 5

## 2020-10-27 MED ORDER — CHLORHEXIDINE GLUCONATE CLOTH 2 % EX PADS: 6.0000 | MEDICATED_PAD | Freq: Once | CUTANEOUS | Status: DC

## 2020-10-27 MED ORDER — ROCURONIUM BROMIDE 10 MG/ML (PF) SYRINGE
PREFILLED_SYRINGE | INTRAVENOUS | Status: AC
  Filled 2020-10-27: qty 10

## 2020-10-27 MED ORDER — ORAL CARE MOUTH RINSE: 15.0000 mL | Freq: Once | OROMUCOSAL | Status: AC

## 2020-10-27 MED ORDER — SUGAMMADEX SODIUM 500 MG/5ML IV SOLN
INTRAVENOUS | Status: DC | PRN
  Administered 2020-10-27: 400 mg via INTRAVENOUS

## 2020-10-27 MED ORDER — ROCURONIUM BROMIDE 10 MG/ML (PF) SYRINGE
PREFILLED_SYRINGE | INTRAVENOUS | Status: DC | PRN
  Administered 2020-10-27: 50 mg via INTRAVENOUS

## 2020-10-27 MED ORDER — BUPIVACAINE HCL (PF) 0.25 % IJ SOLN
INTRAMUSCULAR | Status: AC
  Filled 2020-10-27: qty 30

## 2020-10-27 MED ORDER — PROPOFOL 10 MG/ML IV BOLUS
INTRAVENOUS | Status: DC | PRN
  Administered 2020-10-27: 200 mg via INTRAVENOUS

## 2020-10-27 MED ORDER — ONDANSETRON HCL 4 MG/2ML IJ SOLN
INTRAMUSCULAR | Status: AC
  Filled 2020-10-27: qty 2

## 2020-10-27 MED ORDER — BUPIVACAINE HCL (PF) 0.25 % IJ SOLN
INTRAMUSCULAR | Status: DC | PRN
  Administered 2020-10-27: 10 mL
  Administered 2020-10-27: 4 mL

## 2020-10-27 MED ORDER — MIDAZOLAM HCL 2 MG/2ML IJ SOLN
INTRAMUSCULAR | Status: AC
  Filled 2020-10-27: qty 2

## 2020-10-27 MED ORDER — SUCCINYLCHOLINE CHLORIDE 20 MG/ML IJ SOLN
INTRAMUSCULAR | Status: DC | PRN
  Administered 2020-10-27: 140 mg via INTRAVENOUS

## 2020-10-27 MED ORDER — CHLORHEXIDINE GLUCONATE 0.12 % MT SOLN: 15.0000 mL | Freq: Once | OROMUCOSAL | Status: AC

## 2020-10-27 MED ORDER — PHENYLEPHRINE 40 MCG/ML (10ML) SYRINGE FOR IV PUSH (FOR BLOOD PRESSURE SUPPORT)
PREFILLED_SYRINGE | INTRAVENOUS | Status: AC
  Filled 2020-10-27: qty 10

## 2020-10-27 MED ORDER — PHENYLEPHRINE HCL (PRESSORS) 10 MG/ML IV SOLN
INTRAVENOUS | Status: DC | PRN
  Administered 2020-10-27: 40 ug via INTRAVENOUS
  Administered 2020-10-27 (×2): 80 ug via INTRAVENOUS
  Administered 2020-10-27 (×2): 40 ug via INTRAVENOUS
  Administered 2020-10-27: 80 ug via INTRAVENOUS

## 2020-10-27 MED ORDER — SUCCINYLCHOLINE CHLORIDE 200 MG/10ML IV SOSY
PREFILLED_SYRINGE | INTRAVENOUS | Status: AC
  Filled 2020-10-27: qty 10

## 2020-10-27 MED ORDER — LIDOCAINE 2% (20 MG/ML) 5 ML SYRINGE
INTRAMUSCULAR | Status: DC | PRN
  Administered 2020-10-27: 40 mg via INTRAVENOUS

## 2020-10-27 MED ORDER — CHLORHEXIDINE GLUCONATE 0.12 % MT SOLN
OROMUCOSAL | Status: AC
  Administered 2020-10-27: 12:00:00 15 mL via OROMUCOSAL
  Filled 2020-10-27: qty 15

## 2020-10-27 MED ORDER — MIDAZOLAM HCL 5 MG/5ML IJ SOLN
INTRAMUSCULAR | Status: DC | PRN
  Administered 2020-10-27: 2 mg via INTRAVENOUS

## 2020-10-27 MED ORDER — FENTANYL CITRATE (PF) 250 MCG/5ML IJ SOLN
INTRAMUSCULAR | Status: AC
  Filled 2020-10-27: qty 5

## 2020-10-27 MED ORDER — ONDANSETRON HCL 4 MG/2ML IJ SOLN
INTRAMUSCULAR | Status: DC | PRN
  Administered 2020-10-27: 4 mg via INTRAVENOUS

## 2020-10-27 MED ORDER — PHENYLEPHRINE 40 MCG/ML (10ML) SYRINGE FOR IV PUSH (FOR BLOOD PRESSURE SUPPORT)
PREFILLED_SYRINGE | INTRAVENOUS | Status: AC
  Filled 2020-10-27: qty 20

## 2020-10-27 MED ORDER — 0.9 % SODIUM CHLORIDE (POUR BTL) OPTIME
TOPICAL | Status: DC | PRN
  Administered 2020-10-27: 13:00:00 1000 mL

## 2020-10-27 MED ORDER — DEXAMETHASONE SODIUM PHOSPHATE 10 MG/ML IJ SOLN
INTRAMUSCULAR | Status: DC | PRN
  Administered 2020-10-27: 10 mg via INTRAVENOUS

## 2020-10-27 MED ORDER — PROPOFOL 10 MG/ML IV BOLUS
INTRAVENOUS | Status: AC
  Filled 2020-10-27: qty 20

## 2020-10-27 MED ORDER — LACTATED RINGERS IV SOLN: INTRAVENOUS | Status: DC

## 2020-10-27 MED ORDER — ACETAMINOPHEN 500 MG PO TABS: 1000.0000 mg | ORAL_TABLET | Freq: Once | ORAL | Status: DC

## 2020-10-27 MED ORDER — DEXAMETHASONE SODIUM PHOSPHATE 10 MG/ML IJ SOLN
INTRAMUSCULAR | Status: AC
  Filled 2020-10-27: qty 1

## 2020-10-27 MED ORDER — STERILE WATER FOR IRRIGATION IR SOLN
Status: DC | PRN
  Administered 2020-10-27: 1000 mL

## 2020-10-27 MED ORDER — FENTANYL CITRATE (PF) 100 MCG/2ML IJ SOLN
INTRAMUSCULAR | Status: DC | PRN
  Administered 2020-10-27: 100 ug via INTRAVENOUS

## 2020-10-27 SURGICAL SUPPLY — 34 items
APL PRP STRL LF DISP 70% ISPRP (MISCELLANEOUS) ×1
APL SKNCLS STERI-STRIP NONHPOA (GAUZE/BANDAGES/DRESSINGS) ×2
BENZOIN TINCTURE PRP APPL 2/3 (GAUZE/BANDAGES/DRESSINGS) ×4 IMPLANT
CHLORAPREP W/TINT 26 (MISCELLANEOUS) ×2 IMPLANT
CLOSURE WOUND 1/2 X4 (GAUZE/BANDAGES/DRESSINGS) ×2
COVER SURGICAL LIGHT HANDLE (MISCELLANEOUS) ×3 IMPLANT
DRAPE HALF SHEET 70X43 (DRAPES) ×2 IMPLANT
DRAPE LAPAROTOMY 100X72 PEDS (DRAPES) ×3 IMPLANT
DRAPE UTILITY 15X26 W/TAPE STR (DRAPES) ×2 IMPLANT
DRSG TEGADERM 4X4.75 (GAUZE/BANDAGES/DRESSINGS) ×4 IMPLANT
ELECT REM PT RETURN 9FT ADLT (ELECTROSURGICAL) ×3
ELECTRODE REM PT RTRN 9FT ADLT (ELECTROSURGICAL) ×1 IMPLANT
GAUZE 4X4 16PLY RFD (DISPOSABLE) ×2 IMPLANT
GAUZE SPONGE 4X4 12PLY STRL (GAUZE/BANDAGES/DRESSINGS) ×4 IMPLANT
GLOVE BIO SURGEON STRL SZ7 (GLOVE) ×3 IMPLANT
GLOVE BIOGEL PI IND STRL 7.5 (GLOVE) ×1 IMPLANT
GLOVE BIOGEL PI INDICATOR 7.5 (GLOVE) ×2
GOWN STRL REUS W/ TWL LRG LVL3 (GOWN DISPOSABLE) ×2 IMPLANT
GOWN STRL REUS W/TWL LRG LVL3 (GOWN DISPOSABLE) ×6
KIT BASIN OR (CUSTOM PROCEDURE TRAY) ×3 IMPLANT
KIT TURNOVER KIT B (KITS) ×3 IMPLANT
NDL HYPO 25GX1X1/2 BEV (NEEDLE) ×1 IMPLANT
NEEDLE HYPO 25GX1X1/2 BEV (NEEDLE) ×3 IMPLANT
NS IRRIG 1000ML POUR BTL (IV SOLUTION) ×3 IMPLANT
PACK GENERAL/GYN (CUSTOM PROCEDURE TRAY) ×3 IMPLANT
PAD ARMBOARD 7.5X6 YLW CONV (MISCELLANEOUS) ×3 IMPLANT
PENCIL SMOKE EVACUATOR (MISCELLANEOUS) ×3 IMPLANT
STRIP CLOSURE SKIN 1/2X4 (GAUZE/BANDAGES/DRESSINGS) ×2 IMPLANT
SUT MNCRL AB 4-0 PS2 18 (SUTURE) ×5 IMPLANT
SUT VIC AB 3-0 SH 27 (SUTURE) ×9
SUT VIC AB 3-0 SH 27XBRD (SUTURE) ×1 IMPLANT
SYR CONTROL 10ML LL (SYRINGE) ×3 IMPLANT
TOWEL GREEN STERILE (TOWEL DISPOSABLE) ×3 IMPLANT
TOWEL GREEN STERILE FF (TOWEL DISPOSABLE) ×3 IMPLANT

## 2020-10-27 NOTE — Anesthesia Procedure Notes (Addendum)
Procedure Name: Intubation Date/Time: 10/27/2020 1:00 PM Performed by: Lavell Luster, CRNA Pre-anesthesia Checklist: Patient identified, Emergency Drugs available, Suction available, Patient being monitored and Timeout performed Patient Re-evaluated:Patient Re-evaluated prior to induction Oxygen Delivery Method: Circle system utilized Preoxygenation: Pre-oxygenation with 100% oxygen Induction Type: IV induction Ventilation: Mask ventilation without difficulty Laryngoscope Size: Mac, 4 and Glidescope Grade View: Grade I Tube type: Oral Tube size: 7.5 mm Number of attempts: 1 Airway Equipment and Method: Stylet and Video-laryngoscopy Placement Confirmation: ETT inserted through vocal cords under direct vision,  positive ETCO2 and breath sounds checked- equal and bilateral Secured at: 21 cm Tube secured with: Tape Dental Injury: Teeth and Oropharynx as per pre-operative assessment  Difficulty Due To: Difficulty was anticipated, Difficult Airway- due to reduced neck mobility, Difficult Airway- due to large tongue, Difficult Airway- due to anterior larynx and Difficult Airway- due to dentition Future Recommendations: Recommend- induction with short-acting agent, and alternative techniques readily available

## 2020-10-27 NOTE — Op Note (Signed)
Preop diagnosis: #1 subcutaneous lipoma right neck 2.  Right anterior leg mass Postop diagnosis: #1 subcutaneous lipoma right neck (3 cm) 2.  Right anterior leg subcutaneous lipoma (2 cm) Procedure performed: Excision of subcutaneous lipoma right neck and right anterior leg Surgeon:Wilena Tyndall K Jaselle Pryer Anesthesia: General Indications: This is a 54 year old male who presented with a slowly enlarging subcutaneous mass in his right neck.  He also has a tender right anterior leg mass.  He presents now for excision of both of these.  Description of procedure: Patient is brought to the operating room and placed in the supine position on the operating room table.  After an adequate level of general anesthesia was obtained, his right neck and his right anterior leg were prepped with ChloraPrep and draped sterile fashion.  A timeout was taken to ensure the proper patient and proper procedure.  We began at the right neck.  We infiltrated the area over the palpable mass with 0.25% Marcaine with epinephrine.  I made a 3 cm incision.  We dissected through the dermis and encountered a subcutaneous lipoma.  This lipoma is relatively indistinct and has multiple appendages.  We excised all of the palpable mass.  This carried [place] all the way down to the underlying fascia.  We inspected for hemostasis.  The wound was closed with 3-0 Vicryl and 4-0 Monocryl.  We turned our attention to the anterior leg.  I made a 2 cm incision over the palpable mass.  We dissected through the dermis and encountered another subcutaneous lipoma.  We excised this completely down to the underlying fascia.  Hemostasis was obtained by tying off a small vein branch with 3-0 Vicryl.  The wound was closed with 3-0 Vicryl 4-0 Monocryl.  Benzoin Steri-Strips were applied to both incisions.  Dressings were applied.  He was extubated brought to the recovery room in stable condition.  All sponge, instrument, and needle counts are correct.  Imogene Burn. Georgette Dover,  MD, [facility] Surgery  General/ Trauma Surgery   10/27/2020 2:02 PM

## 2020-10-27 NOTE — Discharge Instructions (Signed)
[facility] °Office Phone Number [phone] ° °Lipoma Excision: POST OP INSTRUCTIONS ° °Always review your discharge instruction sheet given to you by the facility where your surgery was performed. ° °IF YOU HAVE DISABILITY OR FAMILY LEAVE FORMS, YOU MUST BRING THEM TO THE OFFICE FOR PROCESSING.  DO NOT GIVE THEM TO YOUR DOCTOR. ° °1. A prescription for pain medication may be given to you upon discharge.  Take your pain medication as prescribed, if needed.  If narcotic pain medicine is not needed, then you may take acetaminophen (Tylenol) or ibuprofen (Advil) as needed. °2. Take your usually prescribed medications unless otherwise directed °3. If you need a refill on your pain medication, please contact your pharmacy.  They will contact our office to request authorization.  Prescriptions will not be filled after 5pm or on week-ends. °4. You should eat very light the first 24 hours after surgery, such as soup, crackers, pudding, etc.  Resume your normal diet the day after surgery. °5. Most patients will experience some swelling and bruising around the surgical site.  Ice packs will help.  Swelling and bruising can take several days to resolve.  °6. It is common to experience some constipation if taking pain medication after surgery.  Increasing fluid intake and taking a stool softener will usually help or prevent this problem from occurring.  A mild laxative (Milk of Magnesia or Miralax) should be taken according to package directions if there are no bowel movements after 48 hours. °7. You may remove your bandages 48 hours after surgery, and you may shower at that time.  You will have steri-strips (small skin tapes) in place directly over the incision.  These strips should be left on the skin for 7-10 days.   °8. ACTIVITIES:  You may resume regular daily activities (gradually increasing) beginning the next day.   You may have sexual intercourse when it is comfortable. °a. You may drive when you no  longer are taking prescription pain medication, you can comfortably wear a seatbelt, and you can safely maneuver your car and apply brakes. °b. RETURN TO WORK:  1-2 weeks °9. You should see your doctor in the office for a follow-up appointment approximately two to three weeks after your surgery.   ° °WHEN TO CALL YOUR DOCTOR: °1. Fever over 101.0 °2. Nausea and/or vomiting. °3. Extreme swelling or bruising. °4. Continued bleeding from incision. °5. Increased pain, redness, or drainage from the incision. ° °The clinic staff is available to answer your questions during regular business hours.  Please don’t hesitate to call and ask to speak to one of the nurses for clinical concerns.  If you have a medical emergency, go to the nearest emergency room or call 911.  A surgeon from [facility] is always on call at the hospital. ° °For further questions, please visit [email or web address]  ° ° °

## 2020-10-27 NOTE — Transfer of Care (Signed)
Immediate Anesthesia Transfer of Care Note  Patient: Tyler Walters  Procedure(s) Performed: EXCISION OF SUBCUTANEOUS MASS RIGHT NECK AND RIGHT LEG (Right )  Patient Location: PACU  Anesthesia Type:General  Level of Consciousness: awake, alert  and oriented  Airway & Oxygen Therapy: Patient connected to face mask oxygen  Post-op Assessment: Post -op Vital signs reviewed and stable  Post vital signs: stable  Last Vitals:  Vitals Value Taken Time  BP    Temp    Pulse 86 10/27/20 1409  Resp 15 10/27/20 1409  SpO2 100 % 10/27/20 1409  Vitals shown include unvalidated device data.  Last Pain:  Vitals:   10/27/20 1148  TempSrc:   PainSc: 6       Patients Stated Pain Goal: 3 (67/89/38 1017)  Complications: No complications documented.

## 2020-10-27 NOTE — H&P (Signed)
History of Present Illness  The patient is a 54 year old male who presents with a complaint of Mass. The patient had severe asymmetry of a very large abdominal pannus. It was unclear what was causing this massive asymmetry. I had concern for a subcutaneous tumor. He also had a 4 cm mass on his right chest wall. On 01/19/18 we excised lipoma from the right chest wall. We also excised the right abdominal mass along with panniculectomy across his entire lower abdomen. We removed a total of 100 pounds of adipose tissue and skin. His wound was closed with multiple staples and sutures. He had some wound issues after surgery that are not entirely unexpected from an incision this large. A couple of staples were removed and there was some dehiscence of the skin. He has had a lot of serous drainage coming through the incision. Attempts were made to place a Prevena wound VAC over this area but it has difficulty maintaining suction. We removed the VAC on Monday 02/05/18.  The patient was initially hospitalized for a left tibial plateau fracture. This has been repaired and he is finally home from rehabilitation.  09/21/20 The patient continues to have some limited mobility due to pain in his left leg. His in a colectomy incision is completely healed and is not causing any issues. He has had several years of a slowly enlarging soft tissue mass on the right side of his neck. He has also noted some firmness and swelling in the right shin. He comes in today to discuss possible excision of these. The patient is in the bariatric pathway but has not yet met with a surgeon. He is interested in bariatric surgery as he has been unable to lose weight.   Past Surgical History  Knee Surgery  Left.  Diagnostic Studies History  Colonoscopy  never 5-10 years ago  Allergies  Adhesive Tape 1"x5yd *MEDICAL DEVICES AND SUPPLIES*  Morphine Sulfate *ANALGESICS - OPIOID*  Penicillins  Allergies Reconciled    Medication History  Furosemide (40MG Tablet, Oral) Active. amLODIPine-Olmesartan (10-40MG Tablet, Oral) Active. Meloxicam (15MG Tablet, Oral) Active. Potassium Chloride ER (10MEQ Tablet ER, Oral) Active. Pravastatin Sodium (20MG Tablet, Oral) Active. Aspirin (81MG Tablet DR, Oral) Active. Medications Reconciled  Social History Caffeine use  Carbonated beverages, Tea. No alcohol use  No drug use  Tobacco use  Never smoker.  Family History  Arthritis  Father, Mother. Cerebrovascular Accident  Father. Diabetes Mellitus  Father, Mother. Heart Disease  Father, Mother. Hypertension  Father, Mother, Sister.  Other Problems  Arthritis  Back Pain  High blood pressure     Review of Systems  General Present- Weight Gain. Not Present- Appetite Loss, Chills, Fatigue, Fever, Night Sweats and Weight Loss. Skin Present- Dryness. Not Present- Change in Wart/Mole, Hives, Jaundice, New Lesions, Non-Healing Wounds, Rash and Ulcer. HEENT Present- Seasonal Allergies. Not Present- Earache, Hearing Loss, Hoarseness, Nose Bleed, Oral Ulcers, Ringing in the Ears, Sinus Pain, Sore Throat, Visual Disturbances, Wears glasses/contact lenses and Yellow Eyes. Gastrointestinal Present- Constipation. Not Present- Abdominal Pain, Bloating, Bloody Stool, Change in Bowel Habits, Chronic diarrhea, Difficulty Swallowing, Excessive gas, Gets full quickly at meals, Hemorrhoids, Indigestion, Nausea, Rectal Pain and Vomiting. Musculoskeletal Present- Back Pain, Joint Pain, Joint Stiffness, Muscle Pain, Muscle Weakness and Swelling of Extremities. Neurological Present- Numbness and Trouble walking. Not Present- Decreased Memory, Fainting, Headaches, Seizures, Tingling, Tremor and Weakness.  Vitals  Weight: 422.8 lb Height: 70in Body Surface Area: 2.87 m Body Mass Index: 60.66 kg/m  Temp.: 98.45F (  Temporal)  Pulse: 120 (Regular)  P.OX: 94% (Room air) BP: 160/82(Sitting,  Left Arm, Large)       Physical Exam  The physical exam findings are as follows: Note: Well-developed well-nourished no apparent distress Right side of his neck shows a oval shaped 3 cm firm subcutaneous mass. This appears to be a subcutaneous lipoma.  The panniculectomy incision is well-healed with no sign of infection. There is some mild depigmentation of parts of the incision The anterior right lower leg shows a 1 cm firm palpable mass with some minimal surrounding swelling. There is no discrete mass palpated around this firm area. This seems to be in the area that may involve his saphenous vein.    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  We will excise the neck lipoma and the right leg mass under anesthesia.  The surgical procedure has been discussed with the patient.  Potential risks, benefits, alternative treatments, and expected outcomes have been explained.  All of the patient's questions at this time have been answered.  The likelihood of reaching the patient's treatment goal is good.  The patient understand the proposed surgical procedure and wishes to proceed.   Imogene Burn. Georgette Dover, MD, [facility]  General/ Trauma Surgery   10/27/2020 12:26 PM

## 2020-10-27 NOTE — OR Nursing (Signed)
Pt is awake,alert and oriented. Pt is in NAD at this time. Pt and family verbalized understanding of poc and discharge instructions. instructions given to family and reviewed prior to discharge.Pt is ambulatory to wheelchair with stand by assist but not needed with steady gait.   

## 2020-10-28 ENCOUNTER — Encounter (HOSPITAL_COMMUNITY): Payer: Self-pay | Admitting: Surgery

## 2020-10-28 LAB — SURGICAL PATHOLOGY

## 2020-10-28 NOTE — Anesthesia Postprocedure Evaluation (Signed)
Anesthesia Post Note  Patient: Tyler Walters  Procedure(s) Performed: EXCISION OF SUBCUTANEOUS MASS RIGHT NECK AND RIGHT LEG (Right )     Patient location during evaluation: PACU Anesthesia Type: General Level of consciousness: awake and alert Pain management: pain level controlled Vital Signs Assessment: post-procedure vital signs reviewed and stable Respiratory status: spontaneous breathing, nonlabored ventilation, respiratory function stable and patient connected to nasal cannula oxygen Cardiovascular status: blood pressure returned to baseline and stable Postop Assessment: no apparent nausea or vomiting Anesthetic complications: no   No complications documented.  Last Vitals:  Vitals:   10/27/20 1430 10/27/20 1445  BP: 133/73 131/87  Pulse: 88 87  Resp: 13 19  Temp:    SpO2: 100% 99%    Last Pain:  Vitals:   10/27/20 1445  TempSrc:   PainSc: 0-No pain   Pain Goal: Patients Stated Pain Goal: 3 (10/27/20 1148)                 Tiajuana Amass

## 2021-03-28 ENCOUNTER — Other Ambulatory Visit: Payer: Self-pay

## 2021-03-28 ENCOUNTER — Emergency Department (HOSPITAL_COMMUNITY)
Admission: EM | Admit: 2021-03-28 | Discharge: 2021-03-29 | Disposition: A | Discharge status: Home / Self Care | Payer: Medicare HMO | Attending: Emergency Medicine | Admitting: Emergency Medicine

## 2021-03-28 DIAGNOSIS — Z7982 Long term (current) use of aspirin: Secondary | ICD-10-CM | POA: Insufficient documentation

## 2021-03-28 DIAGNOSIS — R519 Headache, unspecified: Secondary | ICD-10-CM | POA: Diagnosis present

## 2021-03-28 DIAGNOSIS — Z79899 Other long term (current) drug therapy: Secondary | ICD-10-CM | POA: Diagnosis not present

## 2021-03-28 DIAGNOSIS — I10 Essential (primary) hypertension: Secondary | ICD-10-CM | POA: Diagnosis not present

## 2021-03-28 DIAGNOSIS — J0100 Acute maxillary sinusitis, unspecified: Secondary | ICD-10-CM | POA: Insufficient documentation

## 2021-03-28 DIAGNOSIS — J320 Chronic maxillary sinusitis: Secondary | ICD-10-CM

## 2021-03-28 NOTE — ED Provider Notes (Signed)
Emergency Medicine Provider Triage Evaluation Note  Tyler Walters , a 55 y.o. male  was evaluated in triage.  Pt complains of nasal congestion and headache x1 week. Patient endorses a persistent frontal/right sided headache associated with photophobia for the past week. States he has had a migraine in the past, but never been diagnosed with migraines. No sick contacts or COVID exposures. No fever or chills. Denies chest pain and shortness of breath. No head injury. No speech or visual changes. No unilateral weakness.  Review of Systems  Positive: Nasal congestion, headache Negative: fever  Physical Exam  BP (!) 138/93 (BP Location: Left Arm)   Pulse 97   Temp 98.3 F (36.8 C) (Oral)   Resp 16   Ht 5\' 8"  (1.727 m)   Wt (!) 206.4 kg   SpO2 97%   BMI 69.19 kg/m  Gen:   Awake, no distress   Resp:  Normal effort  MSK:   Moves extremities without difficulty  Other:    Medical Decision Making  Medically screening exam initiated at 7:38 PM.  Appropriate orders placed.  Tyler Walters was informed that the remainder of the evaluation will be completed by another provider, this initial triage assessment does not replace that evaluation, and the importance of remaining in the ED until their evaluation is complete.  Headache and nasal congestion. Patient deferred COVID test.    Suzy Bouchard, PA-C 03/28/21 1940    Isla Pence, MD 03/31/21 [phone]

## 2021-03-28 NOTE — ED Triage Notes (Signed)
Nasal congestion and headache for almost a week. Has taken ibuprofen, sudafed and nyquil but no relief.   Feels like pollen is bothering him.

## 2021-03-29 ENCOUNTER — Encounter (HOSPITAL_COMMUNITY): Payer: Self-pay | Admitting: Student

## 2021-03-29 ENCOUNTER — Emergency Department (HOSPITAL_COMMUNITY): Payer: Medicare HMO

## 2021-03-29 MED ORDER — FLUTICASONE PROPIONATE 50 MCG/ACT NA SUSP: 1.0000 | Freq: Every day | NASAL | 0 refills | Status: DC

## 2021-03-29 MED ORDER — CETIRIZINE HCL 10 MG PO TABS: 10.0000 mg | ORAL_TABLET | Freq: Every day | ORAL | 0 refills | Status: DC | PRN

## 2021-03-29 MED ORDER — BUTALBITAL-APAP-CAFFEINE 50-325-40 MG PO TABS
1.0000 | ORAL_TABLET | Freq: Once | ORAL | Status: AC
  Administered 2021-03-29: 1 via ORAL
  Filled 2021-03-29: qty 1

## 2021-03-29 MED ORDER — AMOXICILLIN-POT CLAVULANATE 875-125 MG PO TABS: 1.0000 | ORAL_TABLET | Freq: Two times a day (BID) | ORAL | 0 refills | Status: DC

## 2021-03-29 NOTE — Discharge Instructions (Addendum)
You were seen in the emergency department today for headache and sinus concerns.  Your CT scan showed findings of right-sided sinusitis.  We are sending home with Flonase to use 1 spray per nostril daily as well as Zyrtec to take 1 tablet daily to help with the congestion/pain/pressure.  If your symptoms are not improving within the next 48 hours or start to worsen or you experience fevers please start taking Augmentin, this is an antibiotic to cover for bacterial sinusitis.   Please take all of your antibiotics until finished. You may develop abdominal discomfort or diarrhea from the antibiotic.  You may help offset this with probiotics which you can buy at the store (ask your pharmacist if unable to find) or get probiotics in the form of eating yogurt. Do not eat or take the probiotics until 2 hours after your antibiotic. If you are unable to tolerate these side effects follow-up with your primary care provider or return to the emergency department.   If you begin to experience any blistering, rashes, swelling, or difficulty breathing seek medical care for evaluation of potentially more serious side effects.   Please be aware that this medication may interact with other medications you are taking, please be sure to discuss your medication list with your pharmacist.    Please follow-up with your primary care provider within 3 days for reevaluation.  Return to the ER for new or worsening symptoms including but not limited to new or worsening pain, fever, passing out, numbness, weakness, change in your vision, neck stiffness, or any other concerns.

## 2021-03-29 NOTE — ED Provider Notes (Signed)
[facility] EMERGENCY DEPARTMENT Provider Note   CSN: [id number] Arrival date & time: 03/28/21  1915     History Chief Complaint  Patient presents with  . Nasal Congestion  . Headache    TAJUAN DUFAULT is a 55 y.o. male with a hx of hypertension, kidney stones, obesity, and sleep apnea who presents to the ED with complaints of sinus problems and headache for the past 8 days..  Patient states that he developed nasal congestion with subsequent sinus pressure and headache.  He states he is having a headache to the frontal region more so to the right side that is constant, began gradually with progressive worsening, currently an 8 out of 10 in severity, this is worse with bright lights, no alleviating factors.  He tried ibuprofen at home.  He has never had a headache like this before.  He thinks that the pollen is likely contributing to his symptoms as he has been around it a lot recently.  He denies fever, loss of vision, blurry vision, numbness, weakness, facial droop, speech change, cough, trouble breathing, or passing out.  HPI     Past Medical History:  Diagnosis Date  . Essential hypertension    Echo 5/19: Mild LVH, EF 55-60, no RWMA, GR 1 DD, mildly dilated aortic root, mild BAE, mild RVE  . Family history of adverse reaction to anesthesia    Sister has had nausea x1  . History of kidney stones   . Lipoma of abdominal wall    Large lipoma  . Morbid obesity ([place])   . Secondary pulmonary hypertension 03/20/2018  . Sleep apnea     Patient Active Problem List   Diagnosis Date Noted  . Obstructive sleep apnea 07/16/2018  . Secondary pulmonary arterial hypertension ([place]) 03/20/2018  . Drainage from wound--abdominal site 02/04/2018  . Leg edema, left 02/04/2018  . Fracture of tibial shaft, left, closed 02/03/2018  . Closed fracture of left tibial plateau 01/26/2018  . Pannus, abdominal 01/19/2018  . Lipoma of abdominal wall   . Closed bicondylar fracture of  left tibial plateau 01/16/2018  . Morbid obesity ([place]) 06/25/2009  . CELLULITIS AND ABSCESS OF TRUNK 06/25/2009    Past Surgical History:  Procedure Laterality Date  . EXTERNAL FIXATION LEG Left 01/16/2018   Procedure: LEFT KNEE SPANNING EXTERNAL FIXATION LEG;  Surgeon: Leandrew Koyanagi, MD;  Location: [facility];  Service: Orthopedics;  Laterality: Left;  . EXTERNAL FIXATION REMOVAL Left 01/24/2018   Procedure: REMOVAL EXTERNAL FIXATION LEG;  Surgeon: Shona Needles, MD;  Location: [facility];  Service: Orthopedics;  Laterality: Left;  both upper and lower leg involved with fixator  . LIPOMA EXCISION Right 01/19/2018   Procedure: EXCISIONOF RIGHT CHEST WALL MASS, EXCISION OF ABDOMINAL MASS;  Surgeon: Donnie Mesa, MD;  Location: [place];  Service: General;  Laterality: Right;  . MASS EXCISION Right 10/27/2020   Procedure: EXCISION OF SUBCUTANEOUS MASS RIGHT NECK AND RIGHT LEG;  Surgeon: Donnie Mesa, MD;  Location: [facility];  Service: General;  Laterality: Right;  . ORIF TIBIA PLATEAU Left 01/24/2018   Procedure: OPEN REDUCTION INTERNAL FIXATION (ORIF) TIBIAL PLATEAU;  Surgeon: Shona Needles, MD;  Location: [facility];  Service: Orthopedics;  Laterality: Left;  . PANNICULECTOMY N/A 01/19/2018   Procedure: PANNICULECTOMY;  Surgeon: Donnie Mesa, MD;  Location: [facility];  Service: General;  Laterality: N/A;       Family History  Problem Relation Age of Onset  . Diabetes Mother   .  Diabetes Father     Social History   Tobacco Use  . Smoking status: Never Smoker  . Smokeless tobacco: Never Used  Vaping Use  . Vaping Use: Never used  Substance Use Topics  . Alcohol use: Yes    Comment: occasional  . Drug use: No    Home Medications Prior to Admission medications   Medication Sig Start Date End Date Taking? Authorizing Provider  amLODipine-olmesartan (AZOR) 10-40 MG tablet Take 1 tablet by mouth daily. 06/23/20   [provider]  aspirin EC 81 MG tablet Take 1 tablet (81 mg total) by  mouth daily. 09/25/18   Clent Demark, PA-C  docusate sodium (COLACE) 100 MG capsule Take 200 mg by mouth daily.    [provider]  ferrous sulfate 325 (65 FE) MG EC tablet Take 1 tablet (325 mg total) by mouth 2 (two) times daily. Patient not taking: Reported on 10/20/2020 09/26/18   Clent Demark, PA-C  furosemide (LASIX) 40 MG tablet Take 40 mg by mouth 2 (two) times daily.    [provider]  meloxicam (MOBIC) 15 MG tablet Take 15 mg by mouth daily. 04/28/20   [provider]  nitroGLYCERIN (NITROSTAT) 0.4 MG SL tablet Place 1 tablet (0.4 mg total) under the tongue every 5 (five) minutes as needed for chest pain. 09/25/18   Clent Demark, PA-C  oxyCODONE-acetaminophen (PERCOCET) 10-325 MG tablet Take 1 tablet by mouth every 6 (six) hours as needed for pain. 06/23/20   [provider]  potassium chloride (K-DUR) 10 MEQ tablet Take 1 tablet (10 mEq total) by mouth daily. Patient taking differently: Take 10 mEq by mouth 2 (two) times daily. 12/06/18   Nahser, Wonda Cheng, MD  pravastatin (PRAVACHOL) 20 MG tablet Take 20 mg by mouth at bedtime. 07/21/20   [provider]    Allergies    Morphine and related and Tape  Review of Systems   Review of Systems  Constitutional: Negative for chills and fever.  HENT: Positive for congestion, sinus pressure and sinus pain. Negative for ear pain and sore throat.   Eyes: Negative for visual disturbance.  Respiratory: Negative for cough and shortness of breath.   Cardiovascular: Negative for chest pain.  Gastrointestinal: Negative for abdominal pain.  Neurological: Positive for headaches. Negative for dizziness, seizures, syncope, facial asymmetry, weakness and numbness.  All other systems reviewed and are negative.   Physical Exam Updated Vital Signs BP (!) 152/91 (BP Location: Left Arm)   Pulse 67   Temp 98 F (36.7 C) (Oral)   Resp 18   Ht 5\' 8"  (1.727 m)   Wt (!) 206.4 kg   SpO2 100%    BMI 69.19 kg/m   Physical Exam Vitals and nursing note reviewed.  Constitutional:      General: He is not in acute distress.    Appearance: Normal appearance. He is well-developed. He is not toxic-appearing.  HENT:     Head: Normocephalic and atraumatic.     Comments: No tenderness over the temporal artery area.    Right Ear: Ear canal normal. Tympanic membrane is not perforated, erythematous, retracted or bulging.     Left Ear: Ear canal normal. Tympanic membrane is not perforated, erythematous, retracted or bulging.     Ears:     Comments: No mastoid erythema/swellng/tenderness.     Nose:     Right Sinus: Maxillary sinus tenderness and frontal sinus tenderness present.     Left Sinus: No maxillary sinus  tenderness or frontal sinus tenderness.     Comments: Congestion.  Boggy turbinates.    Mouth/Throat:     Pharynx: Oropharynx is clear. Uvula midline. No oropharyngeal exudate or posterior oropharyngeal erythema.     Comments: Posterior oropharynx is symmetric appearing. Patient tolerating own secretions without difficulty. No trismus. No drooling. No hot potato voice. No swelling beneath the tongue, submandibular compartment is soft.  Eyes:     General: Vision grossly intact. Gaze aligned appropriately.        Right eye: No discharge.        Left eye: No discharge.     Extraocular Movements: Extraocular movements intact.     Conjunctiva/sclera: Conjunctivae normal.     Pupils: Pupils are equal, round, and reactive to light.     Comments: No proptosis.  No periorbital edema/erythema.  Cardiovascular:     Rate and Rhythm: Normal rate and regular rhythm.  Pulmonary:     Effort: Pulmonary effort is normal. No respiratory distress.     Breath sounds: Normal breath sounds. No wheezing, rhonchi or rales.  Abdominal:     General: There is no distension.     Palpations: Abdomen is soft.     Tenderness: There is no abdominal tenderness. There is no guarding or rebound.   Musculoskeletal:     Cervical back: Normal range of motion and neck supple. No rigidity.  Lymphadenopathy:     Cervical: No cervical adenopathy.  Skin:    General: Skin is warm and dry.     Findings: No rash.  Neurological:     Mental Status: He is alert.     Comments: Alert. Clear speech. No facial droop. CNIII-XII grossly intact. Bilateral upper and lower extremities' sensation grossly intact. 5/5 symmetric strength with grip strength and with plantar and dorsi flexion bilaterally . Normal finger to nose bilaterally. Gait intact.    Psychiatric:        Mood and Affect: Mood normal.        Behavior: Behavior normal.     ED Results / Procedures / Treatments   Labs (all labs ordered are listed, but only abnormal results are displayed) Labs Reviewed - No data to display  EKG None  Radiology CT Head Wo Contrast  Result Date: 03/29/2021 CLINICAL DATA:  Headache.  Nasal congestion. EXAM: CT HEAD WITHOUT CONTRAST TECHNIQUE: Contiguous axial images were obtained from the base of the skull through the vertex without intravenous contrast. COMPARISON:  None. FINDINGS: Brain: No evidence of large-territorial acute infarction. No parenchymal hemorrhage. No mass lesion. No extra-axial collection. No mass effect or midline shift. No hydrocephalus. Basilar cisterns are patent. Vascular: No hyperdense vessel. Skull: No acute fracture or focal lesion. Sinuses/Orbits: Complete opacification of the right maxillary sinus and right frontal sinus. Partial opacification of the right ethmoid sinuses. Otherwise remaining paranasal sinuses and mastoid air cells are clear. The orbits are unremarkable. Other: None. IMPRESSION: 1. Right sinus disease. Correlate with signs and symptoms of acute sinusitis. 2. No acute intracranial abnormality. Electronically Signed   By: Iven Finn M.D.   On: 03/29/2021 05:58    Procedures Procedures   Medications Ordered in ED Medications - No data to display  ED  Course  I have reviewed the triage vital signs and the nursing notes.  Pertinent labs & imaging results that were available during my care of the patient were reviewed by me and considered in my medical decision making (see chart for details).  [name] was evaluated in  Emergency Department on 03/29/2021 for the symptoms described in the history of present illness. He/she was evaluated in the context of the global COVID-19 pandemic, which necessitated consideration that the patient might be at risk for infection with the SARS-CoV-2 virus that causes COVID-19. Institutional protocols and algorithms that pertain to the evaluation of patients at risk for COVID-19 are in a state of rapid change based on information released by regulatory bodies including the CDC and federal and state organizations. These policies and algorithms were followed during the patient's care in the ED.    MDM Rules/Calculators/A&P                         Patient presents to the ED with complaints of headache/sinus problems x 1 week. Nontoxic, vitals w/ mildly elevated BP otherwise unremarkable. Given no hx of similar headaches will obtain head CT  Additional history obtained:  Additional history obtained from chart review & nursing note review.   Imaging Studies ordered:  I ordered imaging studies which included Head CT, I independently reviewed, formal radiology impression shows:  1. Right sinus disease. Correlate with signs and symptoms of acute sinusitis. 2. No acute intracranial abnormality  Patient has no focal neurologic deficits, he is afebrile, no nuchal rigidity, no dizziness, vision grossly intact, no temporal artery tenderness, and his headache had gradual onset with steady progression, I have a low suspicion for subarachnoid hemorrhage, intracranial hemorrhage, ischemic CVA, meningitis, temporal arteritis, acute angle-closure glaucoma, or dural venous sinus thrombosis. He has no periorbital edema/erythema,  proptosis, or EOM limitation to raise concern for preseptal/septal cellulitis. Based on presentation & CT suspect acute sinusitis- viral vs. Allergic vs. Bacterial. COVID ordered. Afebrile. No nuchal rigidity to raise concern for meningitis, no complicating features on CT. Will trial flonase & zyrtec, however with worsening symptoms over 8 days now will provide abx as well to start if not improving and or starts to worsen within next 48 hours. PCP follow up. I discussed results, treatment plan, need for follow-up, and return precautions with the patient. Provided opportunity for questions, patient confirmed understanding and is in agreement with plan.   Portions of this note were generated with [employer]. Dictation errors may occur despite best attempts at proofreading.  Final Clinical Impression(s) / ED Diagnoses Final diagnoses:  Right maxillary sinusitis    Rx / DC Orders ED Discharge Orders         Ordered    fluticasone (FLONASE) 50 MCG/ACT nasal spray  Daily        03/29/21 0623    cetirizine (ZYRTEC ALLERGY) 10 MG tablet  Daily PRN        03/29/21 0623    amoxicillin-clavulanate (AUGMENTIN) 875-125 MG tablet  Every 12 hours        03/29/21 0623           Amaryllis Dyke, PA-C 03/29/21 3154    Orpah Greek, MD 03/30/21 [phone]

## 2022-02-28 ENCOUNTER — Encounter: Payer: Self-pay | Admitting: Cardiovascular Disease

## 2022-02-28 ENCOUNTER — Ambulatory Visit: Payer: Medicare HMO | Admitting: Cardiovascular Disease

## 2022-02-28 VITALS — BP 134/76 | HR 83 | Ht 68.0 in | Wt >= 6400 oz

## 2022-02-28 DIAGNOSIS — R0789 Other chest pain: Secondary | ICD-10-CM

## 2022-02-28 DIAGNOSIS — I10 Essential (primary) hypertension: Secondary | ICD-10-CM | POA: Diagnosis not present

## 2022-02-28 NOTE — Patient Instructions (Signed)
?  Medication Instructions:  ?Your physician recommends that you continue on your current medications as directed. Please refer to the Current Medication list given to you today. ? ?*If you need a refill on your cardiac medications before your next appointment, please call your pharmacy* ? ? ?Lab Work: ?NONE ?If you have labs (blood work) drawn today and your tests are completely normal, you will receive your results only by: ?MyChart Message (if you have MyChart) OR ?A paper copy in the mail ?If you have any lab test that is abnormal or we need to change your treatment, we will call you to review the results. ? ? ?Testing/Procedures: ?**Clear for Surgery per Dr Acie Fredrickson** ? ? ?Follow-Up: ?At [facility], you and your health needs are our priority.  As part of our continuing mission to provide you with exceptional heart care, we have created designated Provider Care Teams.  These Care Teams include your primary Cardiologist (physician) and Advanced Practice Providers (APPs -  Physician Assistants and Nurse Practitioners) who all work together to provide you with the care you need, when you need it. ? ?We recommend signing up for the patient portal called "MyChart".  Sign up information is provided on this After Visit Summary.  MyChart is used to connect with patients for Virtual Visits (Telemedicine).  Patients are able to view lab/test results, encounter notes, upcoming appointments, etc.  Non-urgent messages can be sent to your provider as well.   ?To learn more about what you can do with MyChart, go to [email or web address].   ? ?Your next appointment:   ? As needed ? ?Provider:   ?[name], MD   ? ?  ? ?Important Information About Sugar ? ? ? ? ?  ?

## 2022-02-28 NOTE — Progress Notes (Signed)
?Cardiology Office Note:   ? ?Date:  02/28/2022  ? ?ID:  Tyler Walters, DOB 03-02-1966, MRN [id number] ? ?PCP:  [name], MD  ?Cardiologist:  [name], MD  ?Electrophysiologist:  None  ? ?Referring MD: [name], MD  ? ?Problem List ?1. Morbid obesity ?2. Anemia ?3.   Obstructive sleep apnea  - unable to wear mask  ?4.  ? ? ?Chief Complaint  ?Patient presents with  ? Chest Pain  ?  ? ?History of Present Illness:   ? ?Tyler Walters is a 56 y.o. male with a hx of  Morbid obesity, HTN, pulmonary HTN  who we are asked to see by [name], MD for episodes of chest pain .  ? ?Had episodes of CP ,  Associated with dyspnea ?Usually at rest.  ?With lying down  ?Has orthopnea - sleeps on 2 pillows.   ?Was hosptilized in March  ?Had a large abdominal lipoma removed.  Also had a left ankle fracture fixed.  ? ?He had the CP for several months after his hospitalziation .   Now has resolved.  ? ?The pain feels like a heaviness,   Last for several minutes.  ?Usually with sitting or lying down .  ?Does not occur with walkig  - waks with cane or walker  ?Is planning on doing water aeorbics ?We discussed his morbid obesity -  ? ?Was on HCTZ and lasix but these were stopped recently . ?He has noticed worsening leg edema  ?Works in [employer] .  ? ?February 28, 2022: ? ?Tyler Walters is seen today for follow-up visit for his chest pain, hyperlipidemia, hypertension.  He has a history of morbid obesity. ?Weight today is 439 pounds. ?Blood pressure is well controlled today ? ?Echocardiogram reveals normal left ventricular systolic function with EF of 55 to 60%.  He has grade 1 diastolic dysfunction. ? ?No recent episodes of CP  ?Breathing is ok ?Getting some exercise  ? ?Is planning on getting gastric bypass surgery in the near future ?He is at low risk for this surgery  ? ? ?Past Medical History:  ?Diagnosis Date  ? Essential hypertension   ? Echo 5/19: Mild LVH, EF 55-60, no RWMA, GR 1 DD, mildly dilated aortic root,  mild BAE, mild RVE  ? Family history of adverse reaction to anesthesia   ? Sister has had nausea x1  ? History of kidney stones   ? Lipoma of abdominal wall   ? Large lipoma  ? Morbid obesity ([place])   ? Secondary pulmonary hypertension 03/20/2018  ? Sleep apnea   ? ? ?Past Surgical History:  ?Procedure Laterality Date  ? EXTERNAL FIXATION LEG Left 01/16/2018  ? Procedure: LEFT KNEE SPANNING EXTERNAL FIXATION LEG;  Surgeon: Leandrew Koyanagi, MD;  Location: [facility];  Service: Orthopedics;  Laterality: Left;  ? EXTERNAL FIXATION REMOVAL Left 01/24/2018  ? Procedure: REMOVAL EXTERNAL FIXATION LEG;  Surgeon: Shona Needles, MD;  Location: [place];  Service: Orthopedics;  Laterality: Left;  both upper and lower leg involved with fixator  ? LIPOMA EXCISION Right 01/19/2018  ? Procedure: EXCISIONOF RIGHT CHEST WALL MASS, EXCISION OF ABDOMINAL MASS;  Surgeon: Donnie Mesa, MD;  Location: [place];  Service: General;  Laterality: Right;  ? MASS EXCISION Right 10/27/2020  ? Procedure: EXCISION OF SUBCUTANEOUS MASS RIGHT NECK AND RIGHT LEG;  Surgeon: Donnie Mesa, MD;  Location: [place];  Service: General;  Laterality: Right;  ? ORIF TIBIA PLATEAU Left 01/24/2018  ?  Procedure: OPEN REDUCTION INTERNAL FIXATION (ORIF) TIBIAL PLATEAU;  Surgeon: Shona Needles, MD;  Location: [place];  Service: Orthopedics;  Laterality: Left;  ? PANNICULECTOMY N/A 01/19/2018  ? Procedure: PANNICULECTOMY;  Surgeon: Donnie Mesa, MD;  Location: [place];  Service: General;  Laterality: N/A;  ? ? ?Current Medications: ?Current Meds  ?Medication Sig  ? amLODipine-olmesartan (AZOR) 10-40 MG tablet Take 1 tablet by mouth daily.  ? aspirin EC 81 MG tablet Take 1 tablet (81 mg total) by mouth daily.  ? docusate sodium (COLACE) 100 MG capsule Take 200 mg by mouth daily.  ? fluticasone (FLONASE) 50 MCG/ACT nasal spray Place 1 spray into both nostrils daily.  ? furosemide (LASIX) 40 MG tablet Take 40 mg by mouth 2 (two) times daily.  ? meloxicam (MOBIC) 15 MG tablet Take  15 mg by mouth daily.  ? nitroGLYCERIN (NITROSTAT) 0.4 MG SL tablet Place 1 tablet (0.4 mg total) under the tongue every 5 (five) minutes as needed for chest pain.  ? oxyCODONE-acetaminophen (PERCOCET) 10-325 MG tablet Take 1 tablet by mouth every 6 (six) hours as needed for pain.  ? potassium chloride (K-DUR) 10 MEQ tablet Take 1 tablet (10 mEq total) by mouth daily.  ? pravastatin (PRAVACHOL) 20 MG tablet Take 20 mg by mouth at bedtime.  ? tiZANidine (ZANAFLEX) 4 MG tablet Take 4 mg by mouth 3 (three) times daily.  ?  ? ?Allergies:   Morphine and related and Tape  ? ?Social History  ? ?Socioeconomic History  ? Marital status: Married  ?  Spouse name: Not on file  ? Number of children: Not on file  ? Years of education: Not on file  ? Highest education level: Not on file  ?Occupational History  ? Not on file  ?Tobacco Use  ? Smoking status: Never  ? Smokeless tobacco: Never  ?Vaping Use  ? Vaping Use: Never used  ?Substance and Sexual Activity  ? Alcohol use: Yes  ?  Comment: occasional  ? Drug use: No  ? Sexual activity: Not on file  ?Other Topics Concern  ? Not on file  ?Social History Narrative  ? Plans discharge home, get some type of health insurance   ? ?Social Determinants of Health  ? ?Financial Resource Strain: Not on file  ?Food Insecurity: Not on file  ?Transportation Needs: Not on file  ?Physical Activity: Not on file  ?Stress: Not on file  ?Social Connections: Not on file  ?  ? ?Family History: ?The patient's family history includes Diabetes in his father and mother. ? ?ROS:   ?Please see the history of present illness.    ? All other systems reviewed and are negative. ? ?EKGs/Labs/Other Studies Reviewed:   ? ?The following studies were reviewed today: ?  ? ? ? ?Recent Labs: ?No results found for requested labs within last 8760 hours.  ?Recent Lipid Panel ?   ?Component Value Date/Time  ? CHOL 142 10/08/2018 0900  ? TRIG 45 10/08/2018 0900  ? HDL 52 10/08/2018 0900  ? CHOLHDL 2.7 10/08/2018 0900  ?  CHOLHDL 4.5 06/14/2009 1420  ? VLDL 11 06/14/2009 1420  ? [place] 81 10/08/2018 0900  ? ? ?Physical Exam:   ? ?Physical Exam: ?Blood pressure 134/76, pulse 83, height '5\' 8"'$  (1.727 m), weight (!) 439 lb (199.1 kg), SpO2 96 %. ? ?GEN:  Well nourished, well developed in no acute distress ?HEENT: Normal ?NECK: No JVD; No carotid bruits ?LYMPHATICS: No lymphadenopathy ?CARDIAC: RRR , no murmurs, rubs,  gallops ?RESPIRATORY:  Clear to auscultation without rales, wheezing or rhonchi  ?ABDOMEN: Soft, non-tender, non-distended ?MUSCULOSKELETAL:  No edema; No deformity  ?SKIN: Warm and dry ?NEUROLOGIC:  Alert and oriented x 3 ? ?ECG :    February 28, 2022: Normal sinus rhythm at 83.  He has inferior Q waves that are unchanged from his previous EKG. 9 echo shows no evidence of previous MI)  ? ?ASSESSMENT:   ? ?1. Chest pain, atypical   ?2. Essential hypertension   ?3. Morbid obesity ([place])   ? ? ?PLAN:   ? ?  ? ?Chest discomfort:   He is not having any further episodes of chest discomfort. ? ?2.  Anemia:   ? ?3.  Hypertension: Blood pressure is well controlled. ? ?4.  Inferior Q waves: He has had inferior Q waves on his past several EKGs.  His Q waves date back to January 2020.  He is not having any angina has never had symptoms consistent with a heart attack.  Echocardiogram does not show any segmental wall motion abnormalities. ? ? ?We will see him on an as-needed basis. ? ?Medication Adjustments/Labs and Tests Ordered: ?Current medicines are reviewed at length with the patient today.  Concerns regarding medicines are outlined above.  ?Orders Placed This Encounter  ?Procedures  ? EKG 12-Lead  ? ?No orders of the defined types were placed in this encounter. ? ? ?Patient Instructions  ? ?Medication Instructions:  ?Your physician recommends that you continue on your current medications as directed. Please refer to the Current Medication list given to you today. ? ?*If you need a refill on your cardiac medications before your next  appointment, please call your pharmacy* ? ? ?Lab Work: ?NONE ?If you have labs (blood work) drawn today and your tests are completely normal, you will receive your results only by: ?MyChart Message (if you

## 2022-03-15 ENCOUNTER — Other Ambulatory Visit (HOSPITAL_COMMUNITY): Payer: Self-pay | Admitting: Specialist

## 2022-03-15 ENCOUNTER — Ambulatory Visit (HOSPITAL_COMMUNITY)
Admission: RE | Admit: 2022-03-15 | Discharge: 2022-03-15 | Disposition: A | Discharge status: Home / Self Care | Payer: Medicare HMO | Source: Ambulatory Visit | Attending: Specialist | Admitting: Specialist

## 2022-03-15 DIAGNOSIS — I739 Peripheral vascular disease, unspecified: Secondary | ICD-10-CM

## 2022-12-04 ENCOUNTER — Emergency Department (HOSPITAL_COMMUNITY): Payer: Medicare HMO

## 2022-12-04 ENCOUNTER — Other Ambulatory Visit: Payer: Self-pay

## 2022-12-04 ENCOUNTER — Encounter (HOSPITAL_COMMUNITY): Payer: Self-pay

## 2022-12-04 ENCOUNTER — Emergency Department (HOSPITAL_COMMUNITY)
Admission: EM | Admit: 2022-12-04 | Discharge: 2022-12-04 | Disposition: A | Discharge status: Home / Self Care | Payer: Medicare HMO | Attending: Emergency Medicine | Admitting: Emergency Medicine

## 2022-12-04 DIAGNOSIS — E785 Hyperlipidemia, unspecified: Secondary | ICD-10-CM | POA: Insufficient documentation

## 2022-12-04 DIAGNOSIS — R911 Solitary pulmonary nodule: Secondary | ICD-10-CM | POA: Diagnosis not present

## 2022-12-04 DIAGNOSIS — I119 Hypertensive heart disease without heart failure: Secondary | ICD-10-CM | POA: Insufficient documentation

## 2022-12-04 DIAGNOSIS — K449 Diaphragmatic hernia without obstruction or gangrene: Secondary | ICD-10-CM | POA: Insufficient documentation

## 2022-12-04 DIAGNOSIS — R0602 Shortness of breath: Secondary | ICD-10-CM | POA: Diagnosis not present

## 2022-12-04 DIAGNOSIS — G4733 Obstructive sleep apnea (adult) (pediatric): Secondary | ICD-10-CM | POA: Diagnosis not present

## 2022-12-04 DIAGNOSIS — I7121 Aneurysm of the ascending aorta, without rupture: Secondary | ICD-10-CM | POA: Diagnosis not present

## 2022-12-04 DIAGNOSIS — R0781 Pleurodynia: Secondary | ICD-10-CM

## 2022-12-04 DIAGNOSIS — R079 Chest pain, unspecified: Secondary | ICD-10-CM | POA: Diagnosis present

## 2022-12-04 LAB — CBC
HCT: 41.6 % (ref 39.0–52.0)
Hemoglobin: 13.1 g/dL (ref 13.0–17.0)
MCH: 25.8 pg — ABNORMAL LOW (ref 26.0–34.0)
MCHC: 31.5 g/dL (ref 30.0–36.0)
MCV: 81.9 fL (ref 80.0–100.0)
Platelets: 353 10*3/uL (ref 150–400)
RBC: 5.08 MIL/uL (ref 4.22–5.81)
RDW: 15.9 % — ABNORMAL HIGH (ref 11.5–15.5)
WBC: 7.3 10*3/uL (ref 4.0–10.5)
nRBC: 0 % (ref 0.0–0.2)

## 2022-12-04 LAB — BASIC METABOLIC PANEL
Anion gap: 7 (ref 5–15)
BUN: 12 mg/dL (ref 6–20)
CO2: 26 mmol/L (ref 22–32)
Calcium: 8.4 mg/dL — ABNORMAL LOW (ref 8.9–10.3)
Chloride: 107 mmol/L (ref 98–111)
Creatinine, Ser: 1.04 mg/dL (ref 0.61–1.24)
GFR, Estimated: >60 mL/min (ref >60)
Glucose, Bld: 97 mg/dL (ref 70–99)
Potassium: 3.7 mmol/L (ref 3.5–5.1)
Sodium: 140 mmol/L (ref 135–145)

## 2022-12-04 LAB — BRAIN NATRIURETIC PEPTIDE: B Natriuretic Peptide: 15.9 pg/mL (ref 0.0–100.0)

## 2022-12-04 LAB — TROPONIN I (HIGH SENSITIVITY)
Troponin I (High Sensitivity): 8 ng/L (ref <18)
Troponin I (High Sensitivity): 7 ng/L (ref <18)

## 2022-12-04 MED ORDER — IOHEXOL 350 MG/ML SOLN
75.0000 mL | Freq: Once | INTRAVENOUS | Status: AC | PRN
  Administered 2022-12-04: 75 mL via INTRAVENOUS

## 2022-12-04 NOTE — ED Triage Notes (Signed)
Patient reports central chest pain with SOB , emesis and diaphoresis onset this week .

## 2022-12-04 NOTE — ED Provider Notes (Signed)
[facility] Provider Note   CSN: [id number] Arrival date & time: 12/04/22  0440     History  Chief Complaint  Patient presents with   Chest Pain    Tyler CUNNINGTON is a 57 y.o. male.  HPI    57 year old male comes in with chief complaint of chest pain. Patient has history of hypertension, hyperlipidemia, OSA and pulmonary tension.  Patient states that the current chest pain started yesterday.  Chest pain is midsternal, left-sided and radiates towards the neck.  Pain is worse with deep inspiration.  Pain has been constant since it arrived, with no specific association with food intake or with exertion.  His pain is worse with him laying down, however he normally feels more congested when he lays down.   Patient has no known history of CAD. Pt has no hx of PE, DVT and denies any exogenous hormone (testosterone / estrogen) use, long distance travels or surgery in the past 6 weeks, active cancer, recent immobilization.  Patient denies any cough.  He does admit that over the last few days, he has felt more short of breath than usual.  Patient does not smoke or use any illicit drugs.  Home Medications Prior to Admission medications   Medication Sig Start Date End Date Taking? Authorizing Provider  amLODipine-olmesartan (AZOR) 10-40 MG tablet Take 1 tablet by mouth daily. 06/23/20   [provider]  aspirin EC 81 MG tablet Take 1 tablet (81 mg total) by mouth daily. 09/25/18   Clent Demark, PA-C  docusate sodium (COLACE) 100 MG capsule Take 200 mg by mouth daily.    [provider]  fluticasone (FLONASE) 50 MCG/ACT nasal spray Place 1 spray into both nostrils daily. 03/29/21   Petrucelli, Samantha R, PA-C  furosemide (LASIX) 40 MG tablet Take 40 mg by mouth 2 (two) times daily.    [provider]  meloxicam (MOBIC) 15 MG tablet Take 15 mg by mouth daily. 04/28/20   [provider]  nitroGLYCERIN  (NITROSTAT) 0.4 MG SL tablet Place 1 tablet (0.4 mg total) under the tongue every 5 (five) minutes as needed for chest pain. 09/25/18   Clent Demark, PA-C  oxyCODONE-acetaminophen (PERCOCET) 10-325 MG tablet Take 1 tablet by mouth every 6 (six) hours as needed for pain. 06/23/20   [provider]  potassium chloride (K-DUR) 10 MEQ tablet Take 1 tablet (10 mEq total) by mouth daily. 12/06/18   Nahser, Wonda Cheng, MD  pravastatin (PRAVACHOL) 20 MG tablet Take 20 mg by mouth at bedtime. 07/21/20   [provider]  tiZANidine (ZANAFLEX) 4 MG tablet Take 4 mg by mouth 3 (three) times daily. 12/07/21   [provider]      Allergies    Morphine and related and Tape    Review of Systems   Review of Systems  All other systems reviewed and are negative.   Physical Exam Updated Vital Signs BP (!) 159/87   Pulse 79   Temp 98.2 F (36.8 C) (Oral)   Resp (!) 21   Ht '5\' 8"'$  (1.727 m)   Wt (!) 183.7 kg   SpO2 99%   BMI 61.58 kg/m  Physical Exam Vitals and nursing note reviewed.  Constitutional:      Appearance: He is well-developed.     Comments: Patient has BMI over 60  HENT:     Head: Atraumatic.  Cardiovascular:     Rate and Rhythm: Normal rate.  Pulmonary:     Effort: Pulmonary effort is normal. No accessory muscle usage or respiratory distress.     Breath sounds: Normal breath sounds.  Musculoskeletal:     Cervical back: Neck supple.     Right lower leg: No tenderness.     Left lower leg: No tenderness.  Skin:    General: Skin is warm.  Neurological:     Mental Status: He is alert and oriented to person, place, and time.     ED Results / Procedures / Treatments   Labs (all labs ordered are listed, but only abnormal results are displayed) Labs Reviewed  BASIC METABOLIC PANEL - Abnormal; Notable for the following components:      Result Value   Calcium 8.4 (*)    All other components within normal limits  CBC - Abnormal; Notable for the  following components:   MCH 25.8 (*)    RDW 15.9 (*)    All other components within normal limits  BRAIN NATRIURETIC PEPTIDE  TROPONIN I (HIGH SENSITIVITY)  TROPONIN I (HIGH SENSITIVITY)    EKG EKG Interpretation  Date/Time:  Sunday December 04 2022 04:45:13 EST Ventricular Rate:  93 PR Interval:  202 QRS Duration: 96 QT Interval:  386 QTC Calculation: 479 R Axis:   -2 Text Interpretation: Normal sinus rhythm Normal ECG When compared with ECG of 22-Oct-2020 08:37, PREVIOUS ECG IS PRESENT No acute changes No significant change since last tracing Confirmed by Varney Biles [phone]) on 12/04/2022 11:41:39 AM  Radiology CT Angio Chest PE W and/or Wo Contrast  Result Date: 12/04/2022 CLINICAL DATA:  Chest pain. EXAM: CT ANGIOGRAPHY CHEST WITH CONTRAST TECHNIQUE: Multidetector CT imaging of the chest was performed using the standard protocol during bolus administration of intravenous contrast. Multiplanar CT image reconstructions and MIPs were obtained to evaluate the vascular anatomy. RADIATION DOSE REDUCTION: This exam was performed according to the departmental dose-optimization program which includes automated exposure control, adjustment of the mA and/or kV according to patient size and/or use of iterative reconstruction technique. CONTRAST:  38m OMNIPAQUE IOHEXOL 350 MG/ML SOLN COMPARISON:  February 14, 2018. FINDINGS: Cardiovascular: There is no evidence of large central pulmonary embolus seen in the main pulmonary artery or main portions of the left and right pulmonary arteries. Evaluation of the more peripheral branches is limited due to attenuation from body habitus, and therefore smaller and more peripheral pulmonary emboli cannot be excluded on the basis of this exam. Mild cardiomegaly is noted. No pericardial effusion. 4.7 cm ascending thoracic aortic aneurysm is noted. Mediastinum/Nodes: Thyroid gland is unremarkable. No adenopathy is noted. Small sliding-type hiatal hernia is noted.  Lungs/Pleura: No pneumothorax or pleural effusion is noted. Left lung is unremarkable. 10 mm subpleural nodule is noted laterally in right lower lobe best seen on image number 59 of series 6. Upper Abdomen: No acute abnormality. Musculoskeletal: No chest wall abnormality. No acute or significant osseous findings. Review of the MIP images confirms the above findings. IMPRESSION: There is no evidence of large central pulmonary embolus seen in the main pulmonary artery or main portions of the left and right pulmonary arteries. However, evaluation of the more peripheral branches of the pulmonary arteries is significantly limited due to attenuation from body habitus, and therefore smaller and more peripheral pulmonary emboli cannot be excluded on the basis of this exam. 4.7 cm Ascending thoracic aortic aneurysm. Recommend semi-annual imaging followup by CTA or MRA and referral to cardiothoracic surgery if not already obtained. This recommendation follows 2010 ACCF/AHA/AATS/ACR/ASA/SCA/SCAI/SIR/STS/SVM Guidelines for  the Diagnosis and Management of Patients With Thoracic Aortic Disease. Circulation. 2010; 121: P536-R443. Aortic aneurysm NOS (ICD10-I71.9). 10 mm subpleural nodule seen in right lower lobe. Consider one of the following in 3 months for both low-risk and high-risk individuals: (a) repeat chest CT, (b) follow-up PET-CT, or (c) tissue sampling. This recommendation follows the consensus statement: Guidelines for Management of Incidental Pulmonary Nodules Detected on CT Images: From the Fleischner Society 2017; Radiology 2017; 284:228-243. Small sliding-type hiatal hernia. Electronically Signed   By: Marijo Conception M.D.   On: 12/04/2022 13:01   DG Chest 2 View  Result Date: 12/04/2022 CLINICAL DATA:  Chest pain EXAM: CHEST - 2 VIEW COMPARISON:  02/14/2018 FINDINGS: Patchy airspace disease noted in both lung bases, compatible with atelectasis or pneumonia. There is pulmonary vascular congestion without overt  pulmonary edema. No pleural effusion. Cardiopericardial silhouette is at upper limits of normal for size. The visualized bony structures of the thorax are unremarkable. IMPRESSION: Patchy airspace disease in both lung bases, compatible with atelectasis or pneumonia. Electronically Signed   By: Misty Stanley M.D.   On: 12/04/2022 06:02    Procedures Procedures    Medications Ordered in ED Medications  iohexol (OMNIPAQUE) 350 MG/ML injection 75 mL (75 mLs Intravenous Contrast Given 12/04/22 1237)    ED Course/ Medical Decision Making/ A&P             HEART Score: 3                Medical Decision Making 57 year old male with history of hypertension, hyperlipidemia, pulmonary hypertension and OSA comes in with chief complaint of pleuritic chest pain.  He also has some shortness of breath that he experiences with exertion.  Differential diagnosis for this patient includes acute coronary syndrome, pulmonary hypertension that is getting worse, pulmonary edema, pleural effusion, CHF, valvular disorder, pulmonary embolism.  Patient's initial EKG is not showing any acute changes.  There is S1, every 3 but no T wave inversion.  The pain is pleuritic, and it is worse with him laying flat, but EKG not consistent with pericarditis and patient states that the symptoms are worse with laying flat because of congestion.  Doubt pericarditis.  We will proceed with CT PE as it is high INR differential diagnosis for him.  Basic labs including high-sensitivity troponins were ordered.  BNP was also ordered.  X-ray independently interpreted.  There is no evidence of pneumothorax, large pleural effusion or pulmonary edema.  Clinically patient does not have pneumonia.  Troponins x 2 are below institutional cutoff for myocardial injury.  Patient's BNP is also reassuring.  CT PE reveals 4.7 cm thoracic artery aneurysm.  This is a new diagnosis for the patient.  Yet, I do not think any acute intervention is needed.  We  have discussed the thoracic artery aneurysm finding with the patient.  We have advised that he follows up with CT surgery.  Given that it is possible that his existing pain is not secondary to the thoracic artery aneurysm, and patient has multiple cardiac risk factors were also given cardiology follow-up.  Strict ER return precautions have been discussed, and patient is agreeing with the plan and is comfortable with the workup done and the recommendations from the ER.   Problems Addressed: Aneurysm of ascending aorta without rupture [facility]): acute illness or injury that poses a threat to life or bodily functions Pleuritic chest pain: acute illness or injury  Amount and/or Complexity of Data Reviewed Labs: ordered. Radiology: ordered.  Risk Prescription drug management.    Final Clinical Impression(s) / ED Diagnoses Final diagnoses:  Aneurysm of ascending aorta without rupture ([place])  Pleuritic chest pain    Rx / DC Orders ED Discharge Orders          Ordered    Ambulatory referral to Cardiology       Comments: If you have not heard from the Cardiology office within the next 72 hours please call [phone].   12/04/22 1349              Varney Biles, MD 12/04/22 1353

## 2022-12-04 NOTE — ED Provider Triage Note (Signed)
  Emergency Medicine Provider Triage Evaluation Note  MRN:  [id number]  Arrival date & time: 12/04/22    Medically screening exam initiated at 5:01 AM.   CC:   CP  HPI:  Tyler Walters is a 57 y.o. year-old male presents to the ED with chief complaint of CP, onset yesterday morning.  Has been constant.  Gradually worsening pain.  Reports some cough and congestion.  History provided by patient. ROS:  -As included in HPI PE:   Vitals:   12/04/22 0451  BP: 137/85  Pulse: 92  Resp: (!) 22  Temp: 98.2 F (36.8 C)  SpO2: 94%    Non-toxic appearing No respiratory distress  MDM:  Based on signs and symptoms, ACS is highest on my differential, followed by URI. I've ordered labs and imaging in triage to expedite lab/diagnostic workup.  Patient was informed that the remainder of the evaluation will be completed by another provider, this initial triage assessment does not replace that evaluation, and the importance of remaining in the ED until their evaluation is complete.    Montine Circle, PA-C 12/04/22 3818

## 2022-12-04 NOTE — Discharge Instructions (Addendum)
We saw you in the ER for chest pain. Your heart enzyme results are negative for any acute findings. CT scan is negative for blood clot, however there was a thoracic artery aneurysm that was detected. You will need to see CT surgeon for surveillance of the thoracic artery aneurysm.  Please call the number provided to set up an appointment.   Please return to the ER if you have worsening chest pain, shortness of breath, pain radiating to your jaw, shoulder, or back, sweats or fainting. Otherwise see the Cardiologist or your primary care doctor as requested.

## 2022-12-18 NOTE — Progress Notes (Signed)
[address]       [place] [address]             [phone]                    [name] Medical Record [id number] Date of Birth: 1965/11/13  Referring: [name], MD Primary Care: [name], MD Primary Cardiologist: [name], MD  Chief Complaint:   No chief complaint on file.   History of Present Illness:    Tyler Walters 57 y.o. male who presents for evaluation of aortic aneurysm  4.7 cm ascending thoracic aortic aneurysm is Noted on a CT scan in Jan 2024  He denies family members with aortic dissection, aneurysm or sudden death.     Per recent cardiology HPI Tyler SCHILDT is a 57 y.o. male with a hx of  Morbid obesity, HTN, pulmonary HTN  who we are asked to see by [name], MD for episodes of chest pain .    Had episodes of CP ,  Associated with dyspnea Usually at rest.  With lying down  Has orthopnea - sleeps on 2 pillows.   Was hosptilized in March  Had a large abdominal lipoma removed.  Also had a left ankle fracture fixed.    He had the CP for several months after his hospitalziation .   Now has resolved.    The pain feels like a heaviness,   Last for several minutes.  Usually with sitting or lying down .  Does not occur with walkig  - waks with cane or walker  Is planning on doing water aeorbics We discussed his morbid obesity -    Was on HCTZ and lasix but these were stopped recently . He has noticed worsening leg edema  Works in [employer] .   Past Medical History:  Diagnosis Date   Essential hypertension    Echo 5/19: Mild LVH, EF 55-60, no RWMA, GR 1 DD, mildly dilated aortic root, mild BAE, mild RVE   Family history of adverse reaction to anesthesia    Sister has had nausea x1   History of kidney stones    Lipoma of abdominal wall    Large lipoma   Morbid obesity ([place])    Secondary pulmonary hypertension 03/20/2018   Sleep apnea     Past Surgical History:  Procedure  Laterality Date   EXTERNAL FIXATION LEG Left 01/16/2018   Procedure: LEFT KNEE SPANNING EXTERNAL FIXATION LEG;  Surgeon: Leandrew Koyanagi, MD;  Location: [facility];  Service: Orthopedics;  Laterality: Left;   EXTERNAL FIXATION REMOVAL Left 01/24/2018   Procedure: REMOVAL EXTERNAL FIXATION LEG;  Surgeon: Shona Needles, MD;  Location: [facility];  Service: Orthopedics;  Laterality: Left;  both upper and lower leg involved with fixator   LIPOMA EXCISION Right 01/19/2018   Procedure: EXCISIONOF RIGHT CHEST WALL MASS, EXCISION OF ABDOMINAL MASS;  Surgeon: Donnie Mesa, MD;  Location: [facility];  Service: General;  Laterality: Right;   MASS EXCISION Right 10/27/2020   Procedure: EXCISION OF SUBCUTANEOUS MASS RIGHT NECK AND RIGHT LEG;  Surgeon: Donnie Mesa, MD;  Location: [facility];  Service: General;  Laterality: Right;   ORIF TIBIA PLATEAU Left 01/24/2018   Procedure: OPEN REDUCTION INTERNAL FIXATION (ORIF) TIBIAL PLATEAU;  Surgeon: Shona Needles, MD;  Location: [facility];  Service: Orthopedics;  Laterality: Left;   PANNICULECTOMY N/A 01/19/2018   Procedure: PANNICULECTOMY;  Surgeon: Donnie Mesa, MD;  Location: [facility];  Service: General;  Laterality: N/A;    Family History  Problem Relation Age of Onset   Diabetes Mother    Diabetes Father      Social History   Tobacco Use  Smoking Status Never  Smokeless Tobacco Never    Social History   Substance and Sexual Activity  Alcohol Use Yes   Comment: occasional     Allergies  Allergen Reactions   Morphine And Related Nausea Only   Tape Itching and Rash    Current Outpatient Medications  Medication Sig Dispense Refill   amLODipine-olmesartan (AZOR) 10-40 MG tablet Take 1 tablet by mouth daily.     aspirin EC 81 MG tablet Take 1 tablet (81 mg total) by mouth daily. 90 tablet 3   docusate sodium (COLACE) 100 MG capsule Take 200 mg by mouth daily.     fluticasone (FLONASE) 50 MCG/ACT nasal spray Place 1 spray into both nostrils daily. 16 g 0    furosemide (LASIX) 40 MG tablet Take 40 mg by mouth 2 (two) times daily.     meloxicam (MOBIC) 15 MG tablet Take 15 mg by mouth daily.     nitroGLYCERIN (NITROSTAT) 0.4 MG SL tablet Place 1 tablet (0.4 mg total) under the tongue every 5 (five) minutes as needed for chest pain. 25 tablet 1   oxyCODONE-acetaminophen (PERCOCET) 10-325 MG tablet Take 1 tablet by mouth every 6 (six) hours as needed for pain.     potassium chloride (K-DUR) 10 MEQ tablet Take 1 tablet (10 mEq total) by mouth daily. 30 tablet 11   pravastatin (PRAVACHOL) 20 MG tablet Take 20 mg by mouth at bedtime.     tiZANidine (ZANAFLEX) 4 MG tablet Take 4 mg by mouth 3 (three) times daily.     No current facility-administered medications for this visit.    ROS 14 point ROS reviewed and negative except as per HPI   PHYSICAL EXAMINATION: There were no vitals taken for this visit.  Gen: NAD Neuro: Alert and oriented CV: regular Resp: Nonlaboured Abd: Soft, ntnd Extr: WWP  Diagnostic Studies & Laboratory data:     Recent Radiology Findings:   CT Angio Chest PE W and/or Wo Contrast  Result Date: 12/04/2022 CLINICAL DATA:  Chest pain. EXAM: CT ANGIOGRAPHY CHEST WITH CONTRAST TECHNIQUE: Multidetector CT imaging of the chest was performed using the standard protocol during bolus administration of intravenous contrast. Multiplanar CT image reconstructions and MIPs were obtained to evaluate the vascular anatomy. RADIATION DOSE REDUCTION: This exam was performed according to the departmental dose-optimization program which includes automated exposure control, adjustment of the mA and/or kV according to patient size and/or use of iterative reconstruction technique. CONTRAST:  8m OMNIPAQUE IOHEXOL 350 MG/ML SOLN COMPARISON:  February 14, 2018. FINDINGS: Cardiovascular: There is no evidence of large central pulmonary embolus seen in the main pulmonary artery or main portions of the left and right pulmonary arteries. Evaluation of the more  peripheral branches is limited due to attenuation from body habitus, and therefore smaller and more peripheral pulmonary emboli cannot be excluded on the basis of this exam. Mild cardiomegaly is noted. No pericardial effusion. 4.7 cm ascending thoracic aortic aneurysm is noted. Mediastinum/Nodes: Thyroid gland is unremarkable. No adenopathy is noted. Small sliding-type hiatal hernia is noted. Lungs/Pleura: No pneumothorax or pleural effusion is noted. Left lung is unremarkable. 10 mm subpleural nodule is noted laterally in right lower lobe best seen on image number 59 of series 6.  Upper Abdomen: No acute abnormality. Musculoskeletal: No chest wall abnormality. No acute or significant osseous findings. Review of the MIP images confirms the above findings. IMPRESSION: There is no evidence of large central pulmonary embolus seen in the main pulmonary artery or main portions of the left and right pulmonary arteries. However, evaluation of the more peripheral branches of the pulmonary arteries is significantly limited due to attenuation from body habitus, and therefore smaller and more peripheral pulmonary emboli cannot be excluded on the basis of this exam. 4.7 cm Ascending thoracic aortic aneurysm. Recommend semi-annual imaging followup by CTA or MRA and referral to cardiothoracic surgery if not already obtained. This recommendation follows 2010 ACCF/AHA/AATS/ACR/ASA/SCA/SCAI/SIR/STS/SVM Guidelines for the Diagnosis and Management of Patients With Thoracic Aortic Disease. Circulation. 2010; [id number]. Aortic aneurysm NOS (ICD10-I71.9). 10 mm subpleural nodule seen in right lower lobe. Consider one of the following in 3 months for both low-risk and high-risk individuals: (a) repeat chest CT, (b) follow-up PET-CT, or (c) tissue sampling. This recommendation follows the consensus statement: Guidelines for Management of Incidental Pulmonary Nodules Detected on CT Images: From the Fleischner Society 2017; Radiology  2017; 284:228-243. Small sliding-type hiatal hernia. Electronically Signed   By: Marijo Conception M.D.   On: 12/04/2022 13:01   DG Chest 2 View  Result Date: 12/04/2022 CLINICAL DATA:  Chest pain EXAM: CHEST - 2 VIEW COMPARISON:  02/14/2018 FINDINGS: Patchy airspace disease noted in both lung bases, compatible with atelectasis or pneumonia. There is pulmonary vascular congestion without overt pulmonary edema. No pleural effusion. Cardiopericardial silhouette is at upper limits of normal for size. The visualized bony structures of the thorax are unremarkable. IMPRESSION: Patchy airspace disease in both lung bases, compatible with atelectasis or pneumonia. Electronically Signed   By: Misty Stanley M.D.   On: 12/04/2022 06:02       I have independently reviewed the above radiology studies  and reviewed the findings with the patient.   Recent Lab Findings: Lab Results  Component Value Date   WBC 7.3 12/04/2022   HGB 13.1 12/04/2022   HCT 41.6 12/04/2022   PLT 353 12/04/2022   GLUCOSE 97 12/04/2022   CHOL 142 10/08/2018   TRIG 45 10/08/2018   HDL 52 10/08/2018   LDLCALC 81 10/08/2018   ALT 14 (L) 01/27/2018   AST 35 01/27/2018   NA 140 12/04/2022   K 3.7 12/04/2022   CL 107 12/04/2022   CREATININE 1.04 12/04/2022   BUN 12 12/04/2022   CO2 26 12/04/2022   TSH 0.594 09/25/2018   INR 1.15 01/24/2018   HGBA1C 5.9 (A) 09/25/2018     Assessment / Plan:   5M with 4.7cm ascending aortic aneurysm. No family history of aortic /sudden death issues.   62moCT scan given recently identified.   Then to yearly scan if stable.   Informed him that BP control is paramount Discussed that severe chest could be aortic aneurysm/dissection and would need immediate medical attention and possibly emergent surgery.   I  spent 30 minutes with  the patient face to face in counseling and coordination of care.    DPierre BaliEnter 12/18/2022 5:01 PM

## 2022-12-20 ENCOUNTER — Encounter (HOSPITAL_BASED_OUTPATIENT_CLINIC_OR_DEPARTMENT_OTHER): Payer: Self-pay | Admitting: Pulmonary Disease

## 2022-12-20 ENCOUNTER — Ambulatory Visit (INDEPENDENT_AMBULATORY_CARE_PROVIDER_SITE_OTHER): Payer: Medicare HMO | Admitting: Pulmonary Disease

## 2022-12-20 VITALS — BP 126/82 | HR 88 | Temp 98.5°F | Ht 68.0 in | Wt >= 6400 oz

## 2022-12-20 DIAGNOSIS — Z9189 Other specified personal risk factors, not elsewhere classified: Secondary | ICD-10-CM | POA: Diagnosis not present

## 2022-12-20 DIAGNOSIS — R0683 Snoring: Secondary | ICD-10-CM

## 2022-12-20 DIAGNOSIS — G4733 Obstructive sleep apnea (adult) (pediatric): Secondary | ICD-10-CM

## 2022-12-20 DIAGNOSIS — R911 Solitary pulmonary nodule: Secondary | ICD-10-CM | POA: Insufficient documentation

## 2022-12-20 NOTE — Assessment & Plan Note (Signed)
We will reassess for OSA with a home sleep test.  He is willing to a trial CPAP if needed  The pathophysiology of obstructive sleep apnea , it's cardiovascular consequences & modes of treatment including CPAP were discused with the patient in detail & they evidenced understanding.

## 2022-12-20 NOTE — Patient Instructions (Signed)
X CT chest wo con in 3 months

## 2022-12-20 NOTE — Progress Notes (Signed)
Subjective:    Patient ID: Tyler Walters, male    DOB: 04-06-1966, 57 y.o.   MRN: [id number]  HPI  Chief Complaint  Patient presents with   Consult    Pt went to the ED and had some imaging performed which showed a lung nodule.   57 year old morbidly obese man is referred for the finding of incidental pulmonary nodule He was evaluated in 2019 by my partner Dr. Lamonte Sakai for pulmonary hypertension, felt to be secondary to moderate obstructive sleep apnea, he had RV dilatation on echo, but could not perform PFTs.  PMH -hypertension Pulm hypertension  ?  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  ED evaluation showed negative troponins, normal EKG, CT angiogram chest was negative for PE but picked up 4.7 cm ascending thoracic aortic aneurysm and 10 mm nodule in the subpleural region of the right lower lobe. This chest pain persisted for 5 days and then subsided.  He reports that he has always been a light sleeper and sleeps only 3 to 4 hours every night for many years.  His watch records less than an hour of deep sleep and 1.5 hours of light sleep. Bedtime is around 10:30 PM, TV stays on the night, sleep latency can be 1 to 2 hours, he sleeps with 4-5 pillows in an upright position, reports several nocturnal awakenings before he is up latest by 6 AM feeling tired with dryness of mouth and denies headaches. There is no history suggestive of cataplexy, sleep paralysis or parasomnias    Significant tests/ events reviewed  PSG 04/14/18,  AHI 16.7 >>couldn't tolerate the CPAP due to claustrophobia.   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   Sleep apnea    Past Surgical History:  Procedure Laterality Date   EXTERNAL FIXATION LEG Left 01/16/2018   Procedure: LEFT KNEE SPANNING EXTERNAL FIXATION LEG;  Surgeon: Leandrew Koyanagi, MD;  Location: [facility];  Service: Orthopedics;  Laterality: Left;   EXTERNAL FIXATION REMOVAL Left 01/24/2018   Procedure: REMOVAL EXTERNAL FIXATION LEG;  Surgeon: Shona Needles, MD;  Location: [facility];  Service: Orthopedics;  Laterality: Left;  both upper and lower leg involved with fixator   LIPOMA EXCISION Right 01/19/2018   Procedure: EXCISIONOF RIGHT CHEST WALL MASS, EXCISION OF ABDOMINAL MASS;  Surgeon: Donnie Mesa, MD;  Location: [facility];  Service: General;  Laterality: Right;   MASS EXCISION Right 10/27/2020   Procedure: EXCISION OF SUBCUTANEOUS MASS RIGHT NECK AND RIGHT LEG;  Surgeon: Donnie Mesa, MD;  Location: [facility];  Service: General;  Laterality: Right;   ORIF TIBIA PLATEAU Left 01/24/2018   Procedure: OPEN REDUCTION INTERNAL FIXATION (ORIF) TIBIAL PLATEAU;  Surgeon: Shona Needles, MD;  Location: [facility];  Service: Orthopedics;  Laterality:  Left;   PANNICULECTOMY N/A 01/19/2018   Procedure: PANNICULECTOMY;  Surgeon: Donnie Mesa, MD;  Location: [facility];  Service: General;  Laterality: N/A;    Allergies  Allergen Reactions   Morphine And Related Nausea Only   Tape Itching and Rash    Social History   Socioeconomic History   Marital status: Married    Spouse name: Not on file   Number of children: Not on file   Years of education: Not on file   Highest education level: Not on file  Occupational History   Not on file  Tobacco Use   Smoking status: Never   Smokeless tobacco: Never   Vaping Use   Vaping Use: Never used  Substance and Sexual Activity   Alcohol use: Yes    Comment: occasional   Drug use: No   Sexual activity: Not on file  Other Topics Concern   Not on file  Social History Narrative   Plans discharge home, get some type of health insurance    Social Determinants of Health   Financial Resource Strain: Not on file  Food Insecurity: Not on file  Transportation Needs: Not on file  Physical Activity: Not on file  Stress: Not on file  Social Connections: Not on file  Intimate Partner Violence: Not on file    Family History  Problem Relation Age of Onset   Diabetes Mother    Diabetes Father      Review of Systems Shortness of breath with activity Chest pain is subsided Tooth problems Nasal congestion Feet swelling Joint stiffness  Constitutional: negative for anorexia, fevers and sweats  Eyes: negative for irritation, redness and visual disturbance  Ears, nose, mouth, throat, and face: negative for earaches, epistaxis, nasal congestion and sore throat  Respiratory: negative for cough,  sputum and wheezing  Cardiovascular: negative for orthopnea, palpitations and syncope  Gastrointestinal: negative for abdominal pain, constipation, diarrhea, melena, nausea and vomiting  Genitourinary:negative for dysuria, frequency and hematuria  Hematologic/lymphatic: negative for bleeding, easy bruising and lymphadenopathy  Musculoskeletal:negative for arthralgias, muscle weakness  Neurological: negative for coordination problems, gait problems, headaches and weakness  Endocrine: negative for diabetic symptoms including polydipsia, polyuria and weight loss     Objective:   Physical Exam  Gen. Pleasant, morbidly obese, in no distress, normal affect ENT - no pallor,icterus, no post nasal drip, class 2 airway Neck: No JVD, no thyromegaly, no carotid bruits Lungs: no use of accessory muscles, no dullness to percussion, decreased without rales or  rhonchi  Cardiovascular: Rhythm regular, heart sounds  normal, no murmurs or gallops, no peripheral edema Abdomen: soft and non-tender, no hepatosplenomegaly, BS normal. Musculoskeletal: No deformities, no cyanosis or clubbing Neuro:  alert, non focal, no tremors       Assessment & Plan:

## 2022-12-20 NOTE — Assessment & Plan Note (Signed)
He is a never smoker and risk of malignancy is low.  However this is a new nodule that was not present in 2019.  I am concerned about the subpleural location.  Will obtain a short-term follow-up CT chest in 3 months.  I feel he would be very high risk for a biopsy.  If nodule is unchanged in 3 months and we will proceed with a PET scan before deciding on biopsy.  I briefly discussed the biopsy options including CT-guided needle biopsy or via navigational bronchoscopy which would require general anesthesia.

## 2022-12-22 ENCOUNTER — Institutional Professional Consult (permissible substitution): Payer: Medicare HMO | Admitting: Cardiothoracic Surgery

## 2022-12-22 VITALS — BP 131/79 | HR 96 | Resp 20 | Ht 68.0 in | Wt >= 6400 oz

## 2022-12-22 DIAGNOSIS — I7121 Aneurysm of the ascending aorta, without rupture: Secondary | ICD-10-CM | POA: Diagnosis not present

## 2023-02-16 ENCOUNTER — Encounter: Payer: Self-pay | Admitting: Cardiovascular Disease

## 2023-02-16 NOTE — Progress Notes (Signed)
Cardiology Office Note:    Date:  02/17/2023   ID:  Tyler Walters, DOB 11/07/1966, MRN [id number]  PCP:  Jearld LeschWilliams, Dwight M, MD  Cardiologist:  Kristeen MissPhilip Annaliz Aven, MD  Electrophysiologist:  None   Referring MD: Jearld LeschWilliams, Dwight M, MD   Problem List 1. Morbid obesity 2. Anemia 3.   Obstructive sleep apnea  - unable to wear mask  4.    Chief Complaint  Patient presents with   Chest Pain   Hypertension     History of Present Illness:    Tyler RaiderGuy A Walters is a 57 y.o. male with a hx of  Morbid obesity, HTN, pulmonary HTN  who we are asked to see by Jearld LeschWilliams, Dwight M, MD for episodes of chest pain .   Had episodes of CP ,  Associated with dyspnea Usually at rest.  With lying down  Has orthopnea - sleeps on 2 pillows.   Was hosptilized in March  Had a large abdominal lipoma removed.  Also had a left ankle fracture fixed.   He had the CP for several months after his hospitalziation .   Now has resolved.   The pain feels like a heaviness,   Last for several minutes.  Usually with sitting or lying down .  Does not occur with walkig  - waks with cane or walker  Is planning on doing water aeorbics We discussed his morbid obesity -   Was on HCTZ and lasix but these were stopped recently . He has noticed worsening leg edema  Works in [employer] .   February 28, 2022:  Tyler PiperGuy is seen today for follow-up visit for his chest pain, hyperlipidemia, hypertension.  He has a history of morbid obesity. Weight today is 439 pounds. Blood pressure is well controlled today  Echocardiogram reveals normal left ventricular systolic function with EF of 55 to 60%.  He has grade 1 diastolic dysfunction.  No recent episodes of CP  Breathing is ok Getting some exercise   Is planning on getting gastric bypass surgery in the near future He is at low risk for this surgery    February 17, 2023  Tyler PiperGuy is seen for follow up of his morbid obesity, chest pain , HTN Was to have gastric bypass surgery  Wt is  417 lbs ( down 22 lbs)  Was seen in the ER with chest pain in Jan. CTA of the chest shows a 4.7 cm ascending aortic aneurism He was seen by Dr. Delia ChimesEnter of [facility] The plan was for a 6 month recheck of the ascending aorta, followed by yearly CT scans Was also found to have a pulmonony nodule   Recommended tight BP control  No further episodes of cp       Past Medical History:  Diagnosis Date   Essential hypertension    Echo 5/19: Mild LVH, EF 55-60, no RWMA, GR 1 DD, mildly dilated aortic root, mild BAE, mild RVE   Family history of adverse reaction to anesthesia    Sister has had nausea x1   History of kidney stones    Lipoma of abdominal wall    Large lipoma   Morbid obesity    Secondary pulmonary hypertension 03/20/2018   Sleep apnea     Past Surgical History:  Procedure Laterality Date   EXTERNAL FIXATION LEG Left 01/16/2018   Procedure: LEFT KNEE SPANNING EXTERNAL FIXATION LEG;  Surgeon: Tarry KosXu, Naiping M, MD;  Location: [facility];  Service: Orthopedics;  Laterality: Left;   EXTERNAL FIXATION  REMOVAL Left 01/24/2018   Procedure: REMOVAL EXTERNAL FIXATION LEG;  Surgeon: Roby Lofts, MD;  Location: [facility];  Service: Orthopedics;  Laterality: Left;  both upper and lower leg involved with fixator   LIPOMA EXCISION Right 01/19/2018   Procedure: EXCISIONOF RIGHT CHEST WALL MASS, EXCISION OF ABDOMINAL MASS;  Surgeon: Manus Rudd, MD;  Location: [facility];  Service: General;  Laterality: Right;   MASS EXCISION Right 10/27/2020   Procedure: EXCISION OF SUBCUTANEOUS MASS RIGHT NECK AND RIGHT LEG;  Surgeon: Manus Rudd, MD;  Location: [facility];  Service: General;  Laterality: Right;   ORIF TIBIA PLATEAU Left 01/24/2018   Procedure: OPEN REDUCTION INTERNAL FIXATION (ORIF) TIBIAL PLATEAU;  Surgeon: Roby Lofts, MD;  Location: [facility];  Service: Orthopedics;  Laterality: Left;   PANNICULECTOMY N/A 01/19/2018   Procedure: PANNICULECTOMY;  Surgeon: Manus Rudd, MD;  Location: [facility];  Service:  General;  Laterality: N/A;    Current Medications: Current Meds  Medication Sig   amLODipine-olmesartan (AZOR) 10-40 MG tablet Take 1 tablet by mouth daily.   aspirin EC 81 MG tablet Take 1 tablet (81 mg total) by mouth daily.   furosemide (LASIX) 40 MG tablet Take 40 mg by mouth 2 (two) times daily.   meloxicam (MOBIC) 15 MG tablet Take 15 mg by mouth daily.   nitroGLYCERIN (NITROSTAT) 0.4 MG SL tablet Place 1 tablet (0.4 mg total) under the tongue every 5 (five) minutes as needed for chest pain.   oxyCODONE-acetaminophen (PERCOCET) 10-325 MG tablet Take 1 tablet by mouth every 6 (six) hours as needed for pain.   potassium chloride (K-DUR) 10 MEQ tablet Take 1 tablet (10 mEq total) by mouth daily.   pravastatin (PRAVACHOL) 20 MG tablet Take 20 mg by mouth at bedtime.   tiZANidine (ZANAFLEX) 4 MG tablet Take 4 mg by mouth 3 (three) times daily.     Allergies:   Morphine and related and Tape   Social History   Socioeconomic History   Marital status: Married    Spouse name: Not on file   Number of children: Not on file   Years of education: Not on file   Highest education level: Not on file  Occupational History   Not on file  Tobacco Use   Smoking status: Never   Smokeless tobacco: Never  Vaping Use   Vaping Use: Never used  Substance and Sexual Activity   Alcohol use: Yes    Comment: occasional   Drug use: No   Sexual activity: Not on file  Other Topics Concern   Not on file  Social History Narrative   Plans discharge home, get some type of health insurance    Social Determinants of Health   Financial Resource Strain: Not on file  Food Insecurity: Not on file  Transportation Needs: Not on file  Physical Activity: Not on file  Stress: Not on file  Social Connections: Not on file     Family History: The patient's family history includes Diabetes in his father and mother.  ROS:   Please see the history of present illness.     All other systems reviewed and are  negative.  EKGs/Labs/Other Studies Reviewed:    The following studies were reviewed today:      Recent Labs: 12/04/2022: B Natriuretic Peptide 15.9; BUN 12; Creatinine, Ser 1.04; Hemoglobin 13.1; Platelets 353; Potassium 3.7; Sodium 140  Recent Lipid Panel    Component Value Date/Time   CHOL 142 10/08/2018 0900   TRIG 45  10/08/2018 0900   HDL 52 10/08/2018 0900   CHOLHDL 2.7 10/08/2018 0900   CHOLHDL 4.5 06/14/2009 1420   VLDL 11 06/14/2009 1420   LDLCALC 81 10/08/2018 0900    Physical Exam:     Physical Exam: Blood pressure 127/82, pulse 97, height 5\' 8"  (1.727 m), weight (!) 417 lb 3.2 oz (189.2 kg), SpO2 97 %.       GEN:  morbidly obese male, in no acute distress HEENT: Normal NECK: No JVD; No carotid bruits LYMPHATICS: No lymphadenopathy CARDIAC: RRR , no murmurs, rubs, gallops RESPIRATORY:  Clear to auscultation without rales, wheezing or rhonchi  ABDOMEN: Soft, non-tender, non-distended MUSCULOSKELETAL:  mild edema in left lower leg,  No deformity  SKIN: Warm and dry NEUROLOGIC:  Alert and oriented x 3   ECG :       ASSESSMENT:    1. Morbid obesity (HCC)   2. Primary hypertension      PLAN:       Chest discomfort:   no further episodes of CP   2.  Anemia:    3.  Hypertension:  BP is well controlled.   4.  Morbid obesity :   has lost 22 lbs since last year. I encourage him to continue to work on diet , exercise to achieve weight loss.   Will see him in a year   Medication Adjustments/Labs and Tests Ordered: Current medicines are reviewed at length with the patient today.  Concerns regarding medicines are outlined above.  No orders of the defined types were placed in this encounter.  No orders of the defined types were placed in this encounter.    Patient Instructions  Medication Instructions:  Your physician recommends that you continue on your current medications as directed. Please refer to the Current Medication list given to you  today.  *If you need a refill on your cardiac medications before your next appointment, please call your pharmacy*   Follow-Up: At [facility], you and your health needs are our priority.  As part of our continuing mission to provide you with exceptional heart care, we have created designated Provider Care Teams.  These Care Teams include your primary Cardiologist (physician) and Advanced Practice Providers (APPs -  Physician Assistants and Nurse Practitioners) who all work together to provide you with the care you need, when you need it.  We recommend signing up for the patient portal called "MyChart".  Sign up information is provided on this After Visit Summary.  MyChart is used to connect with patients for Virtual Visits (Telemedicine).  Patients are able to view lab/test results, encounter notes, upcoming appointments, etc.  Non-urgent messages can be sent to your provider as well.   To learn more about what you can do with MyChart, go to [email or web address].    Your next appointment:   1 year(s)  Provider:   Kristeen Miss, MD        Signed, Kristeen Miss, MD  02/17/2023 1:22 PM    [facility]

## 2023-02-17 ENCOUNTER — Ambulatory Visit: Payer: Medicare HMO | Attending: Cardiovascular Disease | Admitting: Cardiovascular Disease

## 2023-02-17 ENCOUNTER — Encounter: Payer: Self-pay | Admitting: Cardiovascular Disease

## 2023-02-17 DIAGNOSIS — I10 Essential (primary) hypertension: Secondary | ICD-10-CM | POA: Diagnosis not present

## 2023-02-17 NOTE — Patient Instructions (Signed)
Medication Instructions:   Your physician recommends that you continue on your current medications as directed. Please refer to the Current Medication list given to you today.  *If you need a refill on your cardiac medications before your next appointment, please call your pharmacy*    Follow-Up: At [facility], you and your health needs are our priority.  As part of our continuing mission to provide you with exceptional heart care, we have created designated Provider Care Teams.  These Care Teams include your primary Cardiologist (physician) and Advanced Practice Providers (APPs -  Physician Assistants and Nurse Practitioners) who all work together to provide you with the care you need, when you need it.  We recommend signing up for the patient portal called "MyChart".  Sign up information is provided on this After Visit Summary.  MyChart is used to connect with patients for Virtual Visits (Telemedicine).  Patients are able to view lab/test results, encounter notes, upcoming appointments, etc.  Non-urgent messages can be sent to your provider as well.   To learn more about what you can do with MyChart, go to [email or web address].    Your next appointment:   1 year(s)  Provider:   Philip Nahser, MD       

## 2023-03-13 ENCOUNTER — Ambulatory Visit
Admission: RE | Admit: 2023-03-13 | Discharge: 2023-03-13 | Disposition: A | Discharge status: Home / Self Care | Payer: Medicare HMO | Source: Ambulatory Visit | Attending: Pulmonary Disease | Admitting: Pulmonary Disease

## 2023-03-13 DIAGNOSIS — R911 Solitary pulmonary nodule: Secondary | ICD-10-CM

## 2023-03-17 ENCOUNTER — Encounter (HOSPITAL_BASED_OUTPATIENT_CLINIC_OR_DEPARTMENT_OTHER): Payer: Self-pay | Admitting: Pulmonary Disease

## 2023-03-17 ENCOUNTER — Ambulatory Visit (HOSPITAL_BASED_OUTPATIENT_CLINIC_OR_DEPARTMENT_OTHER): Payer: Medicare HMO | Admitting: Pulmonary Disease

## 2023-03-17 VITALS — BP 130/80 | HR 80 | Temp 98.0°F

## 2023-03-17 DIAGNOSIS — R911 Solitary pulmonary nodule: Secondary | ICD-10-CM

## 2023-03-17 DIAGNOSIS — Z9189 Other specified personal risk factors, not elsewhere classified: Secondary | ICD-10-CM

## 2023-03-17 DIAGNOSIS — G4733 Obstructive sleep apnea (adult) (pediatric): Secondary | ICD-10-CM | POA: Diagnosis not present

## 2023-03-17 NOTE — Assessment & Plan Note (Signed)
Pulmonary nodule was resolved on current study indicating inflammatory cause.  No further follow-up imaging required

## 2023-03-17 NOTE — Assessment & Plan Note (Signed)
We will reassess with a home sleep test.  He is willing to proceed now. We discussed option of nasal cradle mask should CPAP be necessary

## 2023-03-17 NOTE — Patient Instructions (Signed)
  X Referral to weight & wellness clinic  X Home sleep test we will try nasal mask this time

## 2023-03-17 NOTE — Progress Notes (Signed)
   Subjective:    Patient ID: Tyler Walters, male    DOB: 07/09/1966, 57 y.o.   MRN: [id number]  HPI  57 yo morbidly obese man for FU of OSA & incidental pulmonary nodule   PMH -hypertension Pulm hypertension  ?  Lipomas on abdominal wall, chest, right calf- requiring excision  FU visit Last office visit we requested home sleep test.  Not scheduled yet. We reviewed 30-month follow-up CT chest. He has lost about 20 pounds.  He was advised to lose 60 pounds before being reevaluated for bariatric surgery He reports loud snoring and choking and gasping episodes in his sleep  Significant tests/ events reviewed   PSG 04/14/18,  AHI 16.7 >>couldn't tolerate the CPAP due to claustrophobia.    CT chest wo con 03/2023 >> resolved nodule, 4.3 cm asc AA  CTA chest 12/04/22 4.7 cm ascending thoracic aortic aneurysm, 10 mm subpleural nodule right lower lobe CTA chest 02/2018 >> dilated main pulmonary artery  Review of Systems neg for any significant sore throat, dysphagia, itching, sneezing, nasal congestion or excess/ purulent secretions, fever, chills, sweats, unintended wt loss, pleuritic or exertional cp, hempoptysis, orthopnea pnd or change in chronic leg swelling. Also denies presyncope, palpitations, heartburn, abdominal pain, nausea, vomiting, diarrhea or change in bowel or urinary habits, dysuria,hematuria, rash, arthralgias, visual complaints, headache, numbness weakness or ataxia.     Objective:   Physical Exam  Gen. Pleasant, obese, in no distress ENT - no lesions, no post nasal drip Neck: No JVD, no thyromegaly, no carotid bruits Lungs: no use of accessory muscles, no dullness to percussion, decreased without rales or rhonchi  Cardiovascular: Rhythm regular, heart sounds  normal, no murmurs or gallops, no peripheral edema Musculoskeletal: No deformities, no cyanosis or clubbing , no tremors       Assessment & Plan:

## 2023-05-15 ENCOUNTER — Other Ambulatory Visit: Payer: Self-pay | Admitting: Cardiothoracic Surgery

## 2023-05-15 DIAGNOSIS — I7121 Aneurysm of the ascending aorta, without rupture: Secondary | ICD-10-CM

## 2023-06-06 ENCOUNTER — Ambulatory Visit: Payer: Medicare HMO | Admitting: Podiatry

## 2023-06-06 ENCOUNTER — Encounter: Payer: Self-pay | Admitting: Podiatry

## 2023-06-06 DIAGNOSIS — M79674 Pain in right toe(s): Secondary | ICD-10-CM

## 2023-06-06 DIAGNOSIS — M79675 Pain in left toe(s): Secondary | ICD-10-CM | POA: Diagnosis not present

## 2023-06-06 DIAGNOSIS — R600 Localized edema: Secondary | ICD-10-CM

## 2023-06-06 DIAGNOSIS — M25572 Pain in left ankle and joints of left foot: Secondary | ICD-10-CM | POA: Diagnosis not present

## 2023-06-06 DIAGNOSIS — G8929 Other chronic pain: Secondary | ICD-10-CM

## 2023-06-06 DIAGNOSIS — B351 Tinea unguium: Secondary | ICD-10-CM | POA: Diagnosis not present

## 2023-06-06 MED ORDER — CICLOPIROX 8 % EX SOLN: Freq: Every day | CUTANEOUS | 11 refills | Status: DC

## 2023-06-06 NOTE — Progress Notes (Unsigned)
    Subjective:  Patient ID: Tyler Walters, male    DOB: [birth year],  MRN: [id number]  Tyler Walters presents to clinic today for:  Chief Complaint  Patient presents with   Diabetes    Pt stated that he came in today to get his feet checked out since he has diabetes. Pt gets pain in the ankles sometimes but it is not all the time.   Patient notes nails are thick, discolored, elongated and painful in shoegear when trying to ambulate.  He is interested in treating the fungus in the toenails.  Patient notes he is having pain in the left ankle.  He also has swelling in the left ankle.  PCP is Jearld Lesch, MD.  Allergies  Allergen Reactions   Morphine And Codeine Nausea Only   Tape Itching and Rash    Review of Systems: Negative except as noted in the HPI.  Objective:  There were no vitals filed for this visit.  Tyler Walters is a pleasant 56 y.o. male in NAD. AAO x 3.  Vascular Examination: Capillary refill time is 3-5 seconds to toes bilateral. Palpable pedal pulses b/l LE. Digital hair present b/l.  Skin temperature gradient WNL b/l. No varicosities b/l. No cyanosis or clubbing noted b/l.  +1 pitting edema around the left ankle.  No ecchymosis.  Dermatological Examination: Pedal skin with normal turgor, texture and tone b/l. No open wounds. No interdigital macerations b/l. Toenails x10 are 3mm thick, discolored, dystrophic with subungual debris. There is pain with compression of the nail plates.  The nails are not elongated.  Neurological Examination: Protective sensation intact bilateral LE. Vibratory sensation intact bilateral LE.  Musculoskeletal Examination: Muscle strength 5/5 to all LE muscle groups b/l.  There is pain on palpation to the anterolateral aspect of the left ankle.  No crepitus with range of motion of the ankle or subtalar joint.  Assessment/Plan: 1. Onychomycosis   2. Chronic pain of left ankle   3. Localized edema     Meds ordered this encounter   Medications   ciclopirox (PENLAC) 8 % solution    Sig: Apply topically at bedtime. Apply over nail and surrounding skin. Apply daily over previous coat. Remove weekly with polish remover.    Dispense:  6 each    Refill:  11   The mycotic toenails were sharply debrided x10 with a power debriding burr to decrease bulk/thickness.  Will send in prescription ciclopirox for the patient to start applying to the toenails daily.  Reviewed how to apply this as well as once weekly removal of the lacquer.  Discussed a cortisone injection for the left ankle pain but patient refused today.  Diabetic footcare reviewed today.  Return in about 3 months (around 09/06/2023) for [facility].   Clerance Lav, DPM, FACFAS [facility]     [address] [place], [place] [address]                Office [phone]  Fax [phone]

## 2023-06-26 ENCOUNTER — Ambulatory Visit: Admission: RE | Admit: 2023-06-26 | Payer: Medicare HMO | Source: Ambulatory Visit

## 2023-06-26 ENCOUNTER — Ambulatory Visit: Payer: Medicare HMO | Admitting: Cardiothoracic Surgery

## 2023-06-26 DIAGNOSIS — I7121 Aneurysm of the ascending aorta, without rupture: Secondary | ICD-10-CM

## 2023-06-26 MED ORDER — IOPAMIDOL (ISOVUE-370) INJECTION 76%
75.0000 mL | Freq: Once | INTRAVENOUS | Status: AC | PRN
  Administered 2023-06-26: 75 mL via INTRAVENOUS

## 2023-06-29 ENCOUNTER — Encounter: Payer: Self-pay | Admitting: Cardiothoracic Surgery

## 2023-06-29 ENCOUNTER — Ambulatory Visit: Payer: Medicare HMO | Admitting: Cardiothoracic Surgery

## 2023-06-29 VITALS — BP 118/69 | HR 76 | Resp 20 | Ht 68.0 in | Wt >= 6400 oz

## 2023-06-29 DIAGNOSIS — I7121 Aneurysm of the ascending aorta, without rupture: Secondary | ICD-10-CM

## 2023-06-29 NOTE — Progress Notes (Signed)
HPI: I was asked to evaluate this 57 year old morbidly obese nondiabetic non-smoker for an asymptomatic 4.5 cm fusiform ascending aneurysm noted as an incidental finding on chest CT scan for sleep apnea.  The patient has mild hypertension well-controlled with amlodipine 10 mg daily.  I reviewed his chest CT scans back to 2019 and the ascending aorta has had no significant change with a smooth walled fusiform ascending aneurysm 4.5 cm diameter above the sinotubular junction.  Patient denies any family history of thoracic or abdominal aneurysmal disease.  Current Outpatient Medications  Medication Sig Dispense Refill   amLODipine-olmesartan (AZOR) 10-40 MG tablet Take 1 tablet by mouth daily.     aspirin EC 81 MG tablet Take 1 tablet (81 mg total) by mouth daily. 90 tablet 3   ciclopirox (PENLAC) 8 % solution Apply topically at bedtime. Apply over nail and surrounding skin. Apply daily over previous coat. Remove weekly with polish remover. 6 each 11   furosemide (LASIX) 40 MG tablet Take 40 mg by mouth 2 (two) times daily.     meloxicam (MOBIC) 15 MG tablet Take 15 mg by mouth daily.     nitroGLYCERIN (NITROSTAT) 0.4 MG SL tablet Place 1 tablet (0.4 mg total) under the tongue every 5 (five) minutes as needed for chest pain. 25 tablet 1   potassium chloride (K-DUR) 10 MEQ tablet Take 1 tablet (10 mEq total) by mouth daily. 30 tablet 11   pravastatin (PRAVACHOL) 20 MG tablet Take 20 mg by mouth at bedtime.     tiZANidine (ZANAFLEX) 4 MG tablet Take 4 mg by mouth 3 (three) times daily.     oxyCODONE-acetaminophen (PERCOCET) 10-325 MG tablet Take 1 tablet by mouth every 6 (six) hours as needed for pain. (Patient not taking: Reported on 06/29/2023)     No current facility-administered medications for this visit.   Past medical history Hypertension controlled with medication Morbid obesity status post panniculectomy Obstructive sleep apnea   Physical Exam: Vitals:   06/29/23 0945  BP: 118/69   Pulse: 76  Resp: 20  SpO2: 97%         Exam    General- alert and comfortable.  Morbidly obese with a large abdominal pannus.    Neck- no JVD, no cervical adenopathy palpable, no carotid bruit   Lungs- clear without rales, wheezes   Cor- regular rate and rhythm, no murmur , gallop   Abdomen- soft, non-tender   Extremities - warm, non-tender, minimal edema   Neuro- oriented, appropriate, no focal weakness    Diagnostic Tests: CT scans and CTA performed 3 days ago all show a mild to moderate fusiform ascending aneurysm 4.5 cm diameter.  There is no evidence of mural hematoma or penetrating ulceration.  The patient had an echocardiogram in 2019 which shows the aortic valve to be trileaflet.  At that time LV EF was normal.  Impression: Patient has a moderate fusiform ascending aneurysm which is low risk for aortic dissection.  He is a non-smoker with well-controlled hypertension and she remains at low risk.  He will need annual surveillance scans which will be scheduled through this office.  Plan: Continue good blood pressure control Annual surveillance CT scan without contrast will be scheduled.   Lovett Sox, MD [facility] [phone]

## 2023-09-11 ENCOUNTER — Encounter (INDEPENDENT_AMBULATORY_CARE_PROVIDER_SITE_OTHER): Payer: Medicare HMO | Admitting: Podiatry

## 2023-09-11 DIAGNOSIS — Z91199 Patient's noncompliance with other medical treatment and regimen due to unspecified reason: Secondary | ICD-10-CM

## 2023-09-11 NOTE — Progress Notes (Signed)
Patient was a no-show for today's scheduled appointment.

## 2024-05-20 ENCOUNTER — Other Ambulatory Visit: Payer: Self-pay | Admitting: Thoracic Surgery (Cardiothoracic Vascular Surgery)

## 2024-05-20 DIAGNOSIS — I7121 Aneurysm of the ascending aorta, without rupture: Secondary | ICD-10-CM

## 2024-06-27 ENCOUNTER — Ambulatory Visit (HOSPITAL_COMMUNITY)
Admission: RE | Admit: 2024-06-27 | Discharge: 2024-06-27 | Disposition: A | Discharge status: Home / Self Care | Source: Ambulatory Visit | Attending: Cardiology | Admitting: Cardiology

## 2024-06-27 DIAGNOSIS — I7121 Aneurysm of the ascending aorta, without rupture: Secondary | ICD-10-CM | POA: Insufficient documentation

## 2024-07-03 NOTE — Progress Notes (Unsigned)
 [address] [place] [address]             [phone]            GURFATEH MCCLAIN [id number] 06-17-1966   History of Present Illness:  Mr. Nachmen Mansel is a 58 year old male with medical history of secondary pulmonary arterial hypertension, hypertension, obstructive sleep apnea, obesity and pulmonary nodules who presents to the clinic for follow-up of ascending thoracic aortic aneurysm.  On CT of chest on 06/27/2024 aneurysm measured 4.3 cm.  This is stable in size in comparison to previous year scans. Blood pressure*** Symptoms*** Exercise***    Current Outpatient Medications on File Prior to Visit  Medication Sig Dispense Refill   amLODipine-olmesartan (AZOR) 10-40 MG tablet Take 1 tablet by mouth daily.     aspirin  EC 81 MG tablet Take 1 tablet (81 mg total) by mouth daily. 90 tablet 3   ciclopirox  (PENLAC ) 8 % solution Apply topically at bedtime. Apply over nail and surrounding skin. Apply daily over previous coat. Remove weekly with polish remover. 6 each 11   furosemide  (LASIX ) 40 MG tablet Take 40 mg by mouth 2 (two) times daily.     meloxicam (MOBIC) 15 MG tablet Take 15 mg by mouth daily.     nitroGLYCERIN  (NITROSTAT ) 0.4 MG SL tablet Place 1 tablet (0.4 mg total) under the tongue every 5 (five) minutes as needed for chest pain. 25 tablet 1   oxyCODONE -acetaminophen  (PERCOCET) 10-325 MG tablet Take 1 tablet by mouth every 6 (six) hours as needed for pain. (Patient not taking: Reported on 06/29/2023)     potassium chloride  (K-DUR) 10 MEQ tablet Take 1 tablet (10 mEq total) by mouth daily. 30 tablet 11   pravastatin  (PRAVACHOL ) 20 MG tablet Take 20 mg by mouth at bedtime.     tiZANidine (ZANAFLEX) 4 MG tablet Take 4 mg by mouth 3 (three) times daily.     No current facility-administered medications on file prior to visit.     ROS:  ROS  There were no vitals taken for this visit.  Physical Exam   Imaging: CLINICAL DATA:  Aortic  aneurysm suspected   EXAM: CT CHEST WITHOUT CONTRAST   TECHNIQUE: Multidetector CT imaging of the chest was performed following the standard protocol without IV contrast.   RADIATION DOSE REDUCTION: This exam was performed according to the departmental dose-optimization program which includes automated exposure control, adjustment of the mA and/or kV according to patient size and/or use of iterative reconstruction technique.   COMPARISON:  June 26, 2023, December 04, 2022, February 14, 2018   FINDINGS: Cardiovascular: No cardiomegaly. Small pericardial effusion. Similar, fusiform aneurysm of the ascending aorta measuring 4.3 cm. The arch and descending aorta are nondilated.   Mediastinum/Nodes: No mediastinal mass.No mediastinal, hilar, or axillary lymphadenopathy.   Lungs/Pleura: The midline trachea and bronchi are patent. No focal airspace consolidation, pleural effusion, or pneumothorax.   Musculoskeletal: No acute fracture or destructive bone lesion.   Upper Abdomen: No acute abnormality in the partially visualized upper abdomen.   IMPRESSION: 1. Similar fusiform aneurysm of the ascending aorta, measuring 4.3 cm. Continued follow-up could be considered, as documented below. 2. No pneumonia, pulmonary edema, or pleural effusion.   Recommend annual imaging followup by CTA or MRA. This recommendation follows 2010 ACCF/AHA/AATS/ACR/ASA/SCA/SCAI/SIR/STS/SVM Guidelines for the Diagnosis and Management of Patients with Thoracic Aortic Disease. Circulation. 2010; 121: Z733-z630. Aortic aneurysm NOS (ICD10-I71.9)  Electronically Signed   By: Rogelia Myers M.D.   On: 06/27/2024 12:48    A/P: Aneurysm of ascending aorta without rupture (HCC) - 4.3 cm ascending thoracic aortic aneurysm on CT scan of chest. We discussed the natural history and and risk factors for growth of ascending aortic aneurysms. Discussed recommendations to minimize the risk of further expansion or  dissection including careful blood pressure control, avoidance of contact sports and heavy lifting, attention to lipid management.  We covered the importance of smoking ***cessation/staying never user.  The patient does not yet meet surgical criteria of >5.5cm. The patient is aware of signs and symptoms of aortic dissection and when to present to the emergency department   Follow-up in 1 year with CT of chest  Risk Modification:  Statin:  pravastatin    Smoking cessation instruction/counseling given:  {CHL AMB [facility] SMOKING CESSATION COUNSELING:20758}  Patient was counseled on importance of Blood Pressure Control  They are instructed to contact their Primary Care Physician if they start to have blood pressure readings over 130s/90s. Do not ever stop blood pressure medications on your own, unless instructed by healthcare professional.  Please avoid use of Fluoroquinolones as this can potentially increase your risk of Aortic Rupture and/or Dissection  Patient educated on signs and symptoms of Aortic Dissection, handout also provided in AVS  Manuelita CHRISTELLA Rough, PA-C 07/03/24

## 2024-07-03 NOTE — Patient Instructions (Signed)

## 2024-07-04 ENCOUNTER — Ambulatory Visit

## 2024-07-04 VITALS — BP 120/77 | HR 95 | Resp 20 | Ht 68.0 in | Wt >= 6400 oz

## 2024-07-04 DIAGNOSIS — I7121 Aneurysm of the ascending aorta, without rupture: Secondary | ICD-10-CM | POA: Diagnosis not present

## 2024-07-17 NOTE — Assessment & Plan Note (Signed)
 The patient is morbidly obese but I applauded him for wanting to lose weight and making dietary changes.  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  I feel strongly that this patient would benefit from a GLP-1 agonist to facilitate weight loss.  He has a BMI >27 and at least 1 cardiovascular risk factor which meets SELECT trial criteria for GLP-1 agonist.  Also encouraged him to try water  aerobics which is less impactful on joints and the resistance can help with calorie burning.  Ultimately he may need bariatric surgery as well, but he probably needs to lose some weight before he will be eligible. -Refer to Pharm.D. for GLP-1 agonist consideration -Follow-up in 6 months

## 2024-07-17 NOTE — Progress Notes (Incomplete)
 Cardiology Office Note:   Date:  07/17/2024  ID:  Tyler Walters, DOB June 02, 1966, MRN [id number] PCP: Trudy Vaughan HERO, MD  [facility] Providers Cardiologist:  Aleene Passe, MD (Inactive) { Chief Complaint: No chief complaint on file.     History of Present Illness:   Tyler Walters is a 58 y.o. male with a PMH of HTN, morbid obesity, TAA, and OSA who presents for follow up ***.   He carries a diagnosis of pulmonary hypertension in the chart based off of mean PA dilation on a CT scan from 2019.  He had an echocardiogram in 2019 but had insufficient TR to assess RVSP and he has never had an RHC.  Last seen by CT surgery on 07/04/2024 for follow-up of his TAA.  CT imaging shows stability of his TAA.  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 [place], [place] [address] [phone]  ------------------------------------------------------------------- Transthoracic Echocardiography  Patient:    Arrie, Zuercher MR #:       [id number] Study Date: 03/26/2018 Gender:     M Age:        52 Height:     172.7 cm Weight:     181.3 kg BSA:        3.07 m^2 Pt. Status: Room:  ATTENDING    Tyler Walters REFERRING    Tyler Lamar Walters SONOGRAPHER  [facility] [place], RCS PERFORMING   [facility],  Outpatient  cc:  ------------------------------------------------------------------- LV EF: 55% -   60%  ------------------------------------------------------------------- Indications:      Pulmonary Hypertension (I27.20).  ------------------------------------------------------------------- History:   PMH:  Obesity.  Risk factors:  Hypertension.  ------------------------------------------------------------------- Study Conclusions  - Left ventricle: The cavity size was normal. Wall thickness was increased in a pattern of mild LVH. Systolic function was normal. The estimated ejection fraction was in the range of 55% to 60%. Wall motion was normal; there were no regional wall motion abnormalities. Doppler parameters are consistent with abnormal left ventricular relaxation (grade 1 diastolic dysfunction). - Aortic root: The aortic root was mildly dilated. - Left atrium: The atrium was mildly dilated. - Right ventricle: The cavity size was mildly dilated. - Right atrium: The atrium was mildly dilated.  Impressions:  - Normal LV systolic function; mild LVH; mild diastolic dysfunction; mildly dilated aortic root; mild LAE/RAE/RVE; no TR for estimation of RVSP.  ------------------------------------------------------------------- Study data:  No prior study was available for comparison.  Study status:  Routine.  Procedure:  The patient reported no pain pre or post test. Transthoracic echocardiography. Image quality was adequate.          Transthoracic echocardiography.  M-mode, complete 2D, spectral Doppler, and color Doppler.  Birthdate: Patient birthdate: [birth year].  Age:  Patient is 58 yr  old.  Sex: Gender: male.    BMI: 60.8 kg/m^2.  Blood pressure:     140/88 Patient status:  Outpatient.  Study date:  Study date: 03/26/2018. Study time: 08:22 AM.  Location:  [facility]  -------------------------------------------------------------------  ------------------------------------------------------------------- Left ventricle:  The cavity size was normal. Wall thickness was increased in a pattern of mild LVH. Systolic function was normal. The estimated ejection fraction was in the range of 55% to 60%. Wall motion was normal; there were no regional wall motion abnormalities. Doppler parameters are consistent with abnormal left ventricular relaxation (grade 1 diastolic dysfunction).  ------------------------------------------------------------------- Aortic valve:   Trileaflet; mildly thickened leaflets. Mobility was not restricted.  Doppler:  Transvalvular velocity was within the normal range. There was no stenosis. There was no regurgitation.  ------------------------------------------------------------------- Aorta:  Aortic root: The aortic root was mildly dilated.  ------------------------------------------------------------------- Mitral valve:   Structurally normal valve.   Mobility was not restricted.  Doppler:  Transvalvular velocity was within the normal range. There was no evidence for stenosis. There was trivial regurgitation.  ------------------------------------------------------------------- Left atrium:  The atrium was mildly dilated.  ------------------------------------------------------------------- Right ventricle:  The cavity size was mildly dilated. Systolic function was normal.  ------------------------------------------------------------------- Pulmonic valve:    Doppler:  Transvalvular velocity was within the normal range. There was no evidence for stenosis.  ------------------------------------------------------------------- Tricuspid valve:   Structurally normal valve.    Doppler: Transvalvular velocity was within the normal range. There was trivial  regurgitation.  ------------------------------------------------------------------- Right atrium:  The atrium was mildly dilated.  ------------------------------------------------------------------- Pericardium:  There was no pericardial effusion.  ------------------------------------------------------------------- Systemic veins: Inferior vena cava: The vessel was normal in size. The respirophasic diameter changes were in the normal range (= 50%), consistent with normal central venous pressure. Diameter: 17 mm.  ------------------------------------------------------------------- Measurements  IVC                                      Value        Reference ID                                       17    mm     ----------  Left ventricle                           Value        Reference LV ID, ED, PLAX chordal        (H)       55.8  mm     43 - 52 LV ID, ES, PLAX chordal                  36.6  mm     23 - 38 LV fx shortening, PLAX chordal           34    %      >=29 LV PW thickness, ED                      13.1  mm     ---------- IVS/LV PW ratio, ED                      0.91         <=1.3  Stroke volume, 2D                        100   ml     ---------- Stroke volume/bsa, 2D                    33    ml/m^2 ---------- LV e&', lateral                           10.1  cm/s   ---------- LV E/e&', lateral                         6.77         ---------- LV e&', medial                            7.51  cm/s   ---------- LV E/e&', medial                          9.11         ---------- LV e&', average                           8.81  cm/s   ---------- LV E/e&', average                         7.77         ----------  Ventricular septum                       Value        Reference IVS thickness, ED                        11.86 mm     ----------  LVOT                                     Value        Reference LVOT ID, S                               23    mm     ---------- LVOT area                                 4.15  cm^2   ---------- LVOT peak velocity, S                    95.3  cm/s   ---------- LVOT mean velocity, S                    73.1  cm/s   ---------- LVOT VTI, S                              24.1  cm     ----------  Aorta  Value        Reference Aortic root ID, ED                       38    mm     ----------  Left atrium                              Value        Reference LA ID, A-P, ES                           37    mm     ---------- LA ID/bsa, A-P                           1.21  cm/m^2 <=2.2 LA volume, S                             92.3  ml     ---------- LA volume/bsa, S                         30.1  ml/m^2 ---------- LA volume, ES, 1-p A4C                   74.9  ml     ---------- LA volume/bsa, ES, 1-p A4C               24.4  ml/m^2 ---------- LA volume, ES, 1-p A2C                   95.8  ml     ---------- LA volume/bsa, ES, 1-p A2C               31.3  ml/m^2 ----------  Mitral valve                             Value        Reference Mitral E-wave peak velocity              68.4  cm/s   ---------- Mitral A-wave peak velocity              68.9  cm/s   ---------- Mitral deceleration time                 158   ms     150 - 230 Mitral E/A ratio, peak                   1            ----------  Right atrium                             Value        Reference RA ID, S-I, ES, A4C            (H)       64.7  mm     34 - 49 RA area, ES, A4C               (H)       23.3  cm^2   8.3 - 19.5 RA volume, ES, A/L  70.9  ml     ---------- RA volume/bsa, ES, A/L                   23.1  ml/m^2 ----------  Right ventricle                          Value        Reference TAPSE                                    31    mm     ---------- RV s&', lateral, S                        15.7  cm/s   ----------  Legend: (L)  and  (H)  mark values outside specified reference  range.  ------------------------------------------------------------------- Prepared and Electronically Authenticated by  Redell Shallow 2019-05-20T12:54:09          ______________________________________________________________________________________________      Risk Assessment/Calculations:   {Does this patient have ATRIAL FIBRILLATION?:[phone]} No BP recorded.  {Refresh Note OR Click here to enter BP  :1}***        Physical Exam:     VS:  There were no vitals taken for this visit. ***    Wt Readings from Last 3 Encounters:  07/04/24 (!) 405 lb (183.7 kg)  06/29/23 (!) 407 lb (184.6 kg)  02/17/23 (!) 417 lb 3.2 oz (189.2 kg)     GEN: Well nourished, well developed, in no acute distress NECK: No JVD; No carotid bruits CARDIAC: ***RRR, no murmurs, rubs, gallops RESPIRATORY:  Clear to auscultation without rales, wheezing or rhonchi  ABDOMEN: Soft, non-tender, non-distended, normal bowel sounds EXTREMITIES:  Warm and well perfused, no edema; No deformity, 2+ radial pulses PSYCH: Normal mood and affect   Assessment & Plan Morbid obesity (HCC) - Consider GLP-1 Primary hypertension  Screening cholesterol level - Lipid panel      {Are you ordering a CV Procedure (e.g. stress test, cath, DCCV, TEE, etc)?   Press F2        :[id number]}   This note was written with the assistance of a dictation microphone or AI dictation software. Please excuse any typos or grammatical errors.   Signed, Georganna Archer, MD 07/17/2024 10:48 AM    [facility]

## 2024-07-18 ENCOUNTER — Encounter: Payer: Self-pay | Admitting: Student in an Organized Health Care Education/Training Program

## 2024-07-18 ENCOUNTER — Ambulatory Visit
Attending: Student in an Organized Health Care Education/Training Program | Admitting: Student in an Organized Health Care Education/Training Program

## 2024-07-18 DIAGNOSIS — Z13220 Encounter for screening for lipoid disorders: Secondary | ICD-10-CM

## 2024-07-18 DIAGNOSIS — R600 Localized edema: Secondary | ICD-10-CM | POA: Diagnosis not present

## 2024-07-18 DIAGNOSIS — I7121 Aneurysm of the ascending aorta, without rupture: Secondary | ICD-10-CM

## 2024-07-18 DIAGNOSIS — I10 Essential (primary) hypertension: Secondary | ICD-10-CM | POA: Diagnosis not present

## 2024-07-18 NOTE — Assessment & Plan Note (Signed)
 Followed by CTS.  Recent CT scan shows stable aneurysm.  Continue to follow.

## 2024-07-18 NOTE — Assessment & Plan Note (Addendum)
 Blood pressure is at goal.  Continue current therapy.  Also, I see no clear evidence that the patient has pulmonary hypertension, so no further evaluation at this time. - BMP - Magnesium 

## 2024-07-18 NOTE — Patient Instructions (Signed)
 Medication Instructions:  START use of compression stockings   *If you need a refill on your cardiac medications before your next appointment, please call your pharmacy*  Lab Work: BMP Magnesium   Lipid Panel   If you have labs (blood work) drawn today and your tests are completely normal, you will receive your results only by: MyChart Message (if you have MyChart) OR A paper copy in the mail If you have any lab test that is abnormal or we need to change your treatment, we will call you to review the results.  REFERRAL TO PHARM-D   Follow-Up: At [facility], you and your health needs are our priority.  As part of our continuing mission to provide you with exceptional heart care, our providers are all part of one team.  This team includes your primary Cardiologist (physician) and Advanced Practice Providers or APPs (Physician Assistants and Nurse Practitioners) who all work together to provide you with the care you need, when you need it.  Your next appointment:   6 month(s)  Provider:   Georganna Archer, MD    We recommend signing up for the patient portal called MyChart.  Sign up information is provided on this After Visit Summary.  MyChart is used to connect with patients for Virtual Visits (Telemedicine).  Patients are able to view lab/test results, encounter notes, upcoming appointments, etc.  Non-urgent messages can be sent to your provider as well.   To learn more about what you can do with MyChart, go to [email or web address].

## 2024-07-19 ENCOUNTER — Ambulatory Visit: Payer: Self-pay | Admitting: Student in an Organized Health Care Education/Training Program

## 2024-07-19 LAB — LIPID PANEL
Chol/HDL Ratio: 2.6 ratio (ref 0.0–5.0)
Cholesterol, Total: 129 mg/dL (ref 100–199)
HDL: 49 mg/dL (ref >39)
LDL Chol Calc (NIH): 70 mg/dL (ref 0–99)
Triglycerides: 42 mg/dL (ref 0–149)
VLDL Cholesterol Cal: 10 mg/dL (ref 5–40)

## 2024-07-19 LAB — BASIC METABOLIC PANEL WITH GFR
BUN/Creatinine Ratio: 17 (ref 9–20)
BUN: 17 mg/dL (ref 6–24)
CO2: 24 mmol/L (ref 20–29)
Calcium: 8.9 mg/dL (ref 8.7–10.2)
Chloride: 104 mmol/L (ref 96–106)
Creatinine, Ser: 1.01 mg/dL (ref 0.76–1.27)
Glucose: 88 mg/dL (ref 70–99)
Potassium: 4 mmol/L (ref 3.5–5.2)
Sodium: 143 mmol/L (ref 134–144)
eGFR: 86 mL/min/1.73 (ref >59)

## 2024-07-19 LAB — MAGNESIUM: Magnesium: 1.9 mg/dL (ref 1.6–2.3)

## 2024-08-20 ENCOUNTER — Ambulatory Visit: Attending: Cardiology | Admitting: Pharmacist

## 2024-08-20 ENCOUNTER — Telehealth: Payer: Self-pay | Admitting: Pharmacist

## 2024-08-20 ENCOUNTER — Encounter: Payer: Self-pay | Admitting: Pharmacist

## 2024-08-20 NOTE — Telephone Encounter (Signed)
 Please complete PA for Wegovy and Zepbound. Has OSA diagnosis as well

## 2024-08-20 NOTE — Progress Notes (Signed)
 Patient ID: Tyler Walters                 DOB: 04/23/1966                    MRN: [id number]     HPI: Tyler Walters is a 58 y.o. male patient referred to pharmacy clinic by Dr Floretta to initiate GLP1-RA therapy. PMH is significant for PAH, HTN, aortic anursym, OSA, and obesity.   Patient presents today to discuss weight loss therapy. Has made dramatic lifestyle changes in past 3 months resulting in >30# lost. Weight on 07/04/24 was 405 indicating a BMI of 61.59.  Current weight of 370 indicates BMI of 56.27.  Has eliminated sodas and fried foods from his diet. Drinks mostly water  or zero calorie drinks. Often prepares food with an air fryer.   Walks with a cane due to needing bilateral knee replacements. Knows that weight loss will help with knee pain and make him a better surgery candidate.  Last A1c 5.9% however this was in 2024.   Labs: Lab Results  Component Value Date   HGBA1C 5.9 (A) 09/25/2018    Wt Readings from Last 1 Encounters:  07/18/24 (!) 367 lb (166.5 kg)    BP Readings from Last 1 Encounters:  07/18/24 122/80   Pulse Readings from Last 1 Encounters:  07/18/24 85       Component Value Date/Time   CHOL 129 07/18/2024 0857   TRIG 42 07/18/2024 0857   HDL 49 07/18/2024 0857   CHOLHDL 2.6 07/18/2024 0857   CHOLHDL 4.5 06/14/2009 1420   VLDL 11 06/14/2009 1420   LDLCALC 70 07/18/2024 0857    Past Medical History:  Diagnosis Date   Essential hypertension    Echo 5/19: Mild LVH, EF 55-60, no RWMA, GR 1 DD, mildly dilated aortic root, mild BAE, mild RVE   Family history of adverse reaction to anesthesia    Sister has had nausea x1   History of kidney stones    Lipoma of abdominal wall    Large lipoma   Morbid obesity (HCC)    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      aspirin  EC 81 MG tablet Take 1 tablet (81 mg total) by mouth daily. 90 tablet 3   furosemide  (LASIX ) 40 MG tablet Take 40 mg by mouth 2 (two) times daily.     meloxicam (MOBIC) 15 MG tablet Take 15 mg by mouth daily.     nitroGLYCERIN  (NITROSTAT ) 0.4 MG SL tablet Place 1 tablet (0.4 mg total) under the tongue every 5 (five) minutes as needed for chest pain. 25 tablet 1   oxyCODONE -acetaminophen  (PERCOCET) 10-325 MG tablet Take 1 tablet by mouth every 6 (six) hours as needed for pain.     potassium chloride  (K-DUR) 10 MEQ tablet Take 1 tablet (10 mEq total) by mouth daily. 30 tablet 11   pravastatin  (PRAVACHOL ) 20 MG tablet Take 20 mg by mouth at bedtime.     tiZANidine (ZANAFLEX) 4 MG tablet Take 4 mg by mouth 3 (three) times daily.     No current facility-administered medications on file prior to visit.    Allergies  Allergen Reactions   Quinolones Other (See Comments)    Ascending thoracic aortic aneurysm, use with caution  Morphine  And Codeine  Nausea Only   Tape Itching and Rash     Assessment/Plan:  1. Obesity/PAH - Patient's BMI today of 56.27 places him in class III obesity category. He has lost 35 pounds since 07/04/24 by focusing on diet changes. Applauded his effort. Would benefit from addition of GLP1a.  Using demo pens, educated patient on mechanism of action, storage, site selection, administration, and dose titration.  Advised patient on common side effects including nausea, diarrhea, dyspepsia, decreased appetite, and fatigue. Counseled patient on reducing meal size and how to titrate medication to minimize side effects. Counseled patient to call if intolerable side effects or if experiencing dehydration, abdominal pain, or dizziness. Along with pharmacotherapy, the patient will follow dietary modifications and aim for at least 150 minutes of moderate-intensity exercise per week, plus resistance training twice a week (as recommended by the American Heart Association). This  resistance training--such as weightlifting, bodyweight exercises, or using resistance bands, adapted to the patient's ability--will help prevent muscle loss.  Tyler Walters, PharmD, BCACP, CDCES, CPP [facility] [address], [place], [place] [address] Phone: [phone]; Fax: [phone] 08/20/2024 4:08 PM

## 2024-08-20 NOTE — Patient Instructions (Signed)
  It was nice meeting you today  The medications we spoke about today are called Zepbound and Wegovy  Both are once weekly injections  I will complete the prior authorizations for you and contact you with the results  Please continue your healthy eating and reducing sugars such as sodas  Let us  know if you have any questions   Medford Bolk, PharmD, BCACP, CDCES, CPP [facility] [address] [place], [place] [address] Phone: [phone]; Fax: [phone] 08/20/2024 8:22 AM

## 2024-08-21 ENCOUNTER — Other Ambulatory Visit (HOSPITAL_COMMUNITY): Payer: Self-pay

## 2024-08-21 NOTE — Telephone Encounter (Signed)
 Pharmacy Patient Advocate Encounter   Received notification from Physician's Office that prior authorization for ZEPBOUND is required/requested.   Insurance verification completed.   The patient is insured through [employer].   Per test claim: PA required; PA submitted to above mentioned insurance via Latent Key/confirmation #/EOC [employer] Status is pending

## 2024-08-21 NOTE — Telephone Encounter (Signed)
 Pharmacy Patient Advocate Encounter  Received notification from [employer] that Prior Authorization for ZEPBOUND has been APPROVED from 08/21/24 to 11/06/25. Ran test claim, Copay is $473.59 (FILL MEETS DEDUCTIBLE). This test claim was processed through [facility]- copay amounts may vary at other pharmacies due to pharmacy/plan contracts, or as the patient moves through the different stages of their insurance plan.

## 2024-08-21 NOTE — Telephone Encounter (Signed)
 A prior authorization can only be completed for one GLP-1 receptor agonist at a time. The patient will not be prescribed both Wegovy and Zepbound concurrently, as concurrent use of dual GLP-1/GIP therapies is not clinically appropriate and is not permitted by insurance guidelines. Please advise as to which GLP-1 receptor you would like for us  to proceed with.
